# Patient Record
Sex: Female | Born: 1955
Health system: Southern US, Community
[De-identification: ages and names within clinical notes are randomized; demographics above are authoritative.]

## PROBLEM LIST (undated history)

## (undated) DIAGNOSIS — F329 Major depressive disorder, single episode, unspecified: Secondary | ICD-10-CM

## (undated) DIAGNOSIS — I10 Essential (primary) hypertension: Secondary | ICD-10-CM

## (undated) DIAGNOSIS — E079 Disorder of thyroid, unspecified: Secondary | ICD-10-CM

## (undated) DIAGNOSIS — F419 Anxiety disorder, unspecified: Secondary | ICD-10-CM

## (undated) DIAGNOSIS — F32A Depression, unspecified: Secondary | ICD-10-CM

## (undated) HISTORY — DX: Essential (primary) hypertension: I10

## (undated) HISTORY — DX: Depression, unspecified: F32.A

## (undated) HISTORY — DX: Major depressive disorder, single episode, unspecified: F32.9

## (undated) HISTORY — DX: Anxiety disorder, unspecified: F41.9

## (undated) HISTORY — DX: Disorder of thyroid, unspecified: E07.9

---

## 2016-05-31 DIAGNOSIS — M797 Fibromyalgia: Secondary | ICD-10-CM | POA: Diagnosis not present

## 2016-05-31 DIAGNOSIS — M25562 Pain in left knee: Secondary | ICD-10-CM | POA: Diagnosis not present

## 2016-05-31 DIAGNOSIS — M159 Polyosteoarthritis, unspecified: Secondary | ICD-10-CM | POA: Diagnosis not present

## 2016-05-31 DIAGNOSIS — M6080 Other myositis, unspecified site: Secondary | ICD-10-CM | POA: Diagnosis not present

## 2016-06-10 DIAGNOSIS — E039 Hypothyroidism, unspecified: Secondary | ICD-10-CM | POA: Diagnosis not present

## 2016-06-10 DIAGNOSIS — I10 Essential (primary) hypertension: Secondary | ICD-10-CM | POA: Diagnosis not present

## 2016-06-10 DIAGNOSIS — M25562 Pain in left knee: Secondary | ICD-10-CM | POA: Diagnosis not present

## 2016-06-10 DIAGNOSIS — Z0000 Encounter for general adult medical examination without abnormal findings: Secondary | ICD-10-CM | POA: Diagnosis not present

## 2016-08-30 ENCOUNTER — Encounter: Payer: Self-pay | Admitting: Physician Assistant

## 2016-08-30 ENCOUNTER — Ambulatory Visit (INDEPENDENT_AMBULATORY_CARE_PROVIDER_SITE_OTHER): Payer: BLUE CROSS/BLUE SHIELD | Admitting: Physician Assistant

## 2016-08-30 VITALS — BP 119/72 | HR 76 | Temp 97.8°F | Ht 67.0 in | Wt 318.0 lb

## 2016-08-30 DIAGNOSIS — M25511 Pain in right shoulder: Secondary | ICD-10-CM | POA: Diagnosis not present

## 2016-08-30 DIAGNOSIS — M25531 Pain in right wrist: Secondary | ICD-10-CM | POA: Diagnosis not present

## 2016-08-30 DIAGNOSIS — M5442 Lumbago with sciatica, left side: Secondary | ICD-10-CM

## 2016-08-30 DIAGNOSIS — E039 Hypothyroidism, unspecified: Secondary | ICD-10-CM | POA: Diagnosis not present

## 2016-08-30 DIAGNOSIS — M25561 Pain in right knee: Secondary | ICD-10-CM

## 2016-08-30 DIAGNOSIS — M5441 Lumbago with sciatica, right side: Secondary | ICD-10-CM

## 2016-08-30 DIAGNOSIS — M25562 Pain in left knee: Secondary | ICD-10-CM

## 2016-08-30 DIAGNOSIS — M159 Polyosteoarthritis, unspecified: Secondary | ICD-10-CM

## 2016-08-30 DIAGNOSIS — M25579 Pain in unspecified ankle and joints of unspecified foot: Secondary | ICD-10-CM | POA: Insufficient documentation

## 2016-08-30 DIAGNOSIS — F3341 Major depressive disorder, recurrent, in partial remission: Secondary | ICD-10-CM

## 2016-08-30 DIAGNOSIS — M797 Fibromyalgia: Secondary | ICD-10-CM | POA: Diagnosis not present

## 2016-08-30 DIAGNOSIS — R739 Hyperglycemia, unspecified: Secondary | ICD-10-CM | POA: Diagnosis not present

## 2016-08-30 DIAGNOSIS — G8929 Other chronic pain: Secondary | ICD-10-CM

## 2016-08-30 DIAGNOSIS — M25532 Pain in left wrist: Secondary | ICD-10-CM

## 2016-08-30 DIAGNOSIS — I10 Essential (primary) hypertension: Secondary | ICD-10-CM | POA: Diagnosis not present

## 2016-08-30 DIAGNOSIS — M25512 Pain in left shoulder: Secondary | ICD-10-CM

## 2016-08-30 LAB — BAYER DCA HB A1C WAIVED: HB A1C (BAYER DCA - WAIVED): 7.8 % — ABNORMAL HIGH (ref <7.0)

## 2016-08-30 MED ORDER — MELOXICAM 7.5 MG PO TABS: 7.5000 mg | ORAL_TABLET | Freq: Every day | ORAL | 11 refills | Status: DC

## 2016-08-30 MED ORDER — AZITHROMYCIN 250 MG PO TABS: ORAL_TABLET | ORAL | 0 refills | Status: DC

## 2016-08-30 MED ORDER — AMOXICILLIN 500 MG PO CAPS: 500.0000 mg | ORAL_CAPSULE | Freq: Three times a day (TID) | ORAL | 0 refills | Status: DC

## 2016-08-30 NOTE — Patient Instructions (Signed)

## 2016-08-30 NOTE — Progress Notes (Signed)
BP 119/72   Pulse 76   Temp 97.8 F (36.6 C) (Oral)   Ht 5\' 7"  (1.702 m)   Wt (!) 318 lb (144.2 kg)   BMI 49.81 kg/m    Subjective:    Patient ID: Cynthia Espinoza, female    DOB: 11/08/1956, 60 y.o.   MRN: [id number]  Cynthia Espinoza is a 60 y.o. female presenting on 08/30/2016 for Follow-up  HPI Patient here to be established as new patient at [facility].  This patient is known to me from [facility].  In July 2017 she had a fasting blood sugar of 169.  She was instructed to reduce carbs and sugar in her diet.  We will perform an A1c today to check on the progress. Her other labs are reviewed and will be brought in to the record.    Her other past medical history is positive for central hypertension, hypothyroidism, generalized osteoarthritis of multiple joints, fibromyalgia, chronic low back pain with bilateral sciatica, chronic pain of the knees and wrists and feet, hyperglycemia, morbid obesity, major depression. All of her medications are reviewed and brought up-to-date we'll send refills if needed.  The patient does have a disability hearing in November. It is noted that she has had multiple long-term conditions that qualify her for disability. She also has been working very limited schedule for several years now. She is very hopeful of a positive outcome.  Relevant past medical, surgical, family and social history reviewed and updated as indicated. Interim medical history since our last visit reviewed. Allergies and medications reviewed and updated.   Data reviewed from any sources in EPIC.  Review of Systems  Constitutional: Negative.  Negative for activity change, fatigue and fever.  HENT: Negative.   Eyes: Negative.   Respiratory: Negative.  Negative for cough, chest tightness and wheezing.   Cardiovascular: Positive for leg swelling. Negative for chest pain and palpitations.  Gastrointestinal: Negative.  Negative for abdominal pain.    Endocrine: Negative.   Genitourinary: Negative.  Negative for dysuria.  Musculoskeletal: Positive for arthralgias, back pain, gait problem, joint swelling and myalgias.  Skin: Negative.   Psychiatric/Behavioral: Positive for dysphoric mood and sleep disturbance.     Social History   Social History  . Marital status: Unknown    Spouse name: N/A  . Number of children: N/A  . Years of education: N/A   Occupational History  . Not on file.   Social History Main Topics  . Smoking status: Never Smoker  . Smokeless tobacco: Never Used  . Alcohol use No  . Drug use: No  . Sexual activity: Not on file   Other Topics Concern  . Not on file   Social History Narrative  . No narrative on file    Past Surgical History:  Procedure Laterality Date  . CESAREAN SECTION      Family History  Problem Relation Age of Onset  . Hypertension Mother   . Thyroid disease Mother   . Osteoporosis Mother       Medication List       Accurate as of 08/30/16  9:40 PM. Always use your most recent med list.          ALPRAZolam 0.5 MG tablet Commonly known as:  XANAX Take 0.5 mg by mouth 2 (two) times daily as needed for anxiety.   amLODipine 10 MG tablet Commonly known as:  NORVASC Take 10 mg by mouth daily.   amoxicillin 500 MG  capsule Commonly known as:  AMOXIL Take 1 capsule (500 mg total) by mouth 3 (three) times daily.   azithromycin 250 MG tablet Commonly known as:  ZITHROMAX Z-PAK As directed   benazepril 40 MG tablet Commonly known as:  LOTENSIN Take 40 mg by mouth daily.   DULoxetine 60 MG capsule Commonly known as:  CYMBALTA Take 120 mg by mouth daily.   hydrochlorothiazide 25 MG tablet Commonly known as:  HYDRODIURIL Take 25 mg by mouth daily.   levothyroxine 200 MCG tablet Commonly known as:  SYNTHROID, LEVOTHROID Take 200 mcg by mouth daily before breakfast.   meloxicam 7.5 MG tablet Commonly known as:  MOBIC Take 1 tablet (7.5 mg total) by mouth  daily.          Objective:    BP 119/72   Pulse 76   Temp 97.8 F (36.6 C) (Oral)   Ht 5\' 7"  (1.702 m)   Wt (!) 318 lb (144.2 kg)   BMI 49.81 kg/m   Allergies  Allergen Reactions  . Asa [Aspirin] Rash  . Penicillins Rash   Wt Readings from Last 3 Encounters:  08/30/16 (!) 318 lb (144.2 kg)    Physical Exam  Constitutional: She is oriented to person, place, and time. She appears well-developed and well-nourished.  HENT:  Head: Normocephalic and atraumatic.  Eyes: Conjunctivae and EOM are normal. Pupils are equal, round, and reactive to light.  Cardiovascular: Normal rate, regular rhythm, normal heart sounds and intact distal pulses.   Pulmonary/Chest: Effort normal and breath sounds normal.  Abdominal: Soft. Bowel sounds are normal.  Musculoskeletal:       Right shoulder: She exhibits decreased range of motion and tenderness.       Left shoulder: She exhibits decreased range of motion and tenderness.       Right wrist: She exhibits decreased range of motion and swelling.       Left wrist: She exhibits decreased range of motion and swelling.       Right knee: She exhibits decreased range of motion and swelling. Tenderness found.       Left knee: She exhibits decreased range of motion and swelling. Tenderness found.       Cervical back: She exhibits decreased range of motion and tenderness.       Lumbar back: She exhibits decreased range of motion, tenderness, pain and spasm.  Neurological: She is alert and oriented to person, place, and time. She has normal reflexes.  Skin: Skin is warm and dry. No rash noted.  Psychiatric: She has a normal mood and affect. Her behavior is normal. Judgment and thought content normal.    Results for orders placed or performed in visit on 08/30/16  Bayer DCA Hb A1c Waived  Result Value Ref Range   Bayer DCA Hb A1c Waived 7.8 (H) <7.0 %      Assessment & Plan:   1. Essential hypertension - hydrochlorothiazide (HYDRODIURIL) 25 MG  tablet; Take 25 mg by mouth daily. - amLODipine (NORVASC) 10 MG tablet; Take 10 mg by mouth daily. - benazepril (LOTENSIN) 40 MG tablet; Take 40 mg by mouth daily.  2. Hypothyroidism, unspecified type - levothyroxine (SYNTHROID, LEVOTHROID) 200 MCG tablet; Take 200 mcg by mouth daily before breakfast.  3. Generalized OA - DULoxetine (CYMBALTA) 60 MG capsule; Take 120 mg by mouth daily. - meloxicam (MOBIC) 7.5 MG tablet; Take 1 tablet (7.5 mg total) by mouth daily.  Dispense: 30 tablet; Refill: 11  4. Fibromyalgia - DULoxetine (CYMBALTA)  60 MG capsule; Take 120 mg by mouth daily.  5. Chronic bilateral low back pain with bilateral sciatica  6. Chronic pain of both knees  7. Pain in both wrists  8. Pain in joint involving ankle and foot, unspecified laterality  9. Pain of both shoulder joints  10. Hyperglycemia - Bayer DCA Hb A1c Waived  11. Morbid obesity (HCC)  12. Recurrent major depressive disorder, in partial remission (HCC) - ALPRAZolam (XANAX) 0.5 MG tablet; Take 0.5 mg by mouth 2 (two) times daily as needed for anxiety. - DULoxetine (CYMBALTA) 60 MG capsule; Take 120 mg by mouth daily.   Continue all other maintenance medications as listed above. Educational handout given for carb counting  Follow up plan: Return in about 3 months (around 11/30/2016).  Remus Loffler PA-C [facility] [address]  [place], [place] [address] [phone]   08/30/2016, 9:40 PM

## 2016-09-01 ENCOUNTER — Telehealth: Payer: Self-pay | Admitting: Physician Assistant

## 2016-09-01 MED ORDER — METFORMIN HCL ER 500 MG PO TB24: 500.0000 mg | ORAL_TABLET | Freq: Every day | ORAL | 2 refills | Status: DC

## 2016-09-01 NOTE — Addendum Note (Signed)
Addended by: Lorelee CoverOSTOSKY, JESSICA C on: 09/01/2016 09:44 AM   Modules accepted: Orders

## 2016-09-01 NOTE — Telephone Encounter (Signed)
Spoke with pt and advised I didn't see anything in [facility] notes about checking BS but only to be careful with her Carbohydrate intake. Pt voiced understanding.

## 2016-09-13 ENCOUNTER — Encounter: Payer: Self-pay | Admitting: Physician Assistant

## 2016-09-16 ENCOUNTER — Other Ambulatory Visit: Payer: Self-pay | Admitting: *Deleted

## 2016-09-16 DIAGNOSIS — F3341 Major depressive disorder, recurrent, in partial remission: Secondary | ICD-10-CM

## 2016-09-16 MED ORDER — ALPRAZOLAM 0.5 MG PO TABS: 0.5000 mg | ORAL_TABLET | Freq: Two times a day (BID) | ORAL | 1 refills | Status: DC | PRN

## 2016-09-19 NOTE — Telephone Encounter (Signed)
Alprazolam called in to pharmacy

## 2016-09-20 ENCOUNTER — Other Ambulatory Visit: Payer: Self-pay | Admitting: Physician Assistant

## 2016-09-23 ENCOUNTER — Other Ambulatory Visit: Payer: Self-pay | Admitting: Physician Assistant

## 2016-09-23 NOTE — Telephone Encounter (Signed)
Spoke with patient and advised we called in on 11/6 and we recalled it to the pharmacy today.

## 2016-10-26 ENCOUNTER — Ambulatory Visit (INDEPENDENT_AMBULATORY_CARE_PROVIDER_SITE_OTHER): Payer: BLUE CROSS/BLUE SHIELD | Admitting: Family

## 2016-10-26 ENCOUNTER — Encounter: Payer: Self-pay | Admitting: Family

## 2016-10-26 ENCOUNTER — Telehealth: Payer: Self-pay | Admitting: Physician Assistant

## 2016-10-26 VITALS — BP 136/78 | HR 91 | Temp 97.8°F | Ht 67.0 in | Wt 310.0 lb

## 2016-10-26 DIAGNOSIS — J0110 Acute frontal sinusitis, unspecified: Secondary | ICD-10-CM | POA: Diagnosis not present

## 2016-10-26 MED ORDER — BENZONATATE 200 MG PO CAPS: 200.0000 mg | ORAL_CAPSULE | Freq: Three times a day (TID) | ORAL | 1 refills | Status: DC | PRN

## 2016-10-26 MED ORDER — FLUTICASONE PROPIONATE 50 MCG/ACT NA SUSP: 2.0000 | Freq: Every day | NASAL | 6 refills | Status: DC

## 2016-10-26 MED ORDER — DOXYCYCLINE HYCLATE 100 MG PO TABS: 100.0000 mg | ORAL_TABLET | Freq: Two times a day (BID) | ORAL | 0 refills | Status: DC

## 2016-10-26 NOTE — Patient Instructions (Signed)

## 2016-10-26 NOTE — Progress Notes (Signed)
Subjective:    Patient ID: Cynthia Espinoza, female    DOB: [birth year], 58 y.o.   MRN: [id number]  Cough  This is a new problem. The current episode started 1 to 4 weeks ago. The problem has been gradually worsening. The problem occurs every few minutes. The cough is productive of sputum. Associated symptoms include chills, ear congestion, a fever, myalgias, nasal congestion, rhinorrhea, a sore throat, shortness of breath and wheezing. Pertinent negatives include no ear pain or headaches. The symptoms are aggravated by lying down. She has tried rest for the symptoms. The treatment provided mild relief. There is no history of asthma or COPD.      Review of Systems  Constitutional: Positive for chills and fever.  HENT: Positive for rhinorrhea and sore throat. Negative for ear pain.   Respiratory: Positive for cough, shortness of breath and wheezing.   Musculoskeletal: Positive for myalgias.  Neurological: Negative for headaches.  All other systems reviewed and are negative.      Objective:   Physical Exam  Constitutional: She is oriented to person, place, and time. She appears well-developed and well-nourished. No distress.  HENT:  Head: Normocephalic and atraumatic.  Right Ear: External ear normal.  Left Ear: External ear normal.  Nose: Mucosal edema and rhinorrhea present. Right sinus exhibits frontal sinus tenderness. Left sinus exhibits frontal sinus tenderness.  Mouth/Throat: Posterior oropharyngeal erythema present.  Eyes: Pupils are equal, round, and reactive to light.  Neck: Normal range of motion. Neck supple. No thyromegaly present.  Cardiovascular: Normal rate, regular rhythm, normal heart sounds and intact distal pulses.   No murmur heard. Pulmonary/Chest: Effort normal and breath sounds normal. No respiratory distress. She has no wheezes.  Abdominal: Soft. Bowel sounds are normal. She exhibits no distension. There is no tenderness.  Musculoskeletal: Normal range of  motion. She exhibits no edema or tenderness.  Neurological: She is alert and oriented to person, place, and time. She has normal reflexes. No cranial nerve deficit.  Skin: Skin is warm and dry.  Psychiatric: She has a normal mood and affect. Her behavior is normal. Judgment and thought content normal.  Vitals reviewed.     BP 136/78   Pulse 91   Temp 97.8 F (36.6 C) (Oral)   Ht 5\' 7"  (1.702 m)   Wt (!) 310 lb (140.6 kg)   BMI 48.55 kg/m      Assessment & Plan:  1. Acute frontal sinusitis, recurrence not specified - Take meds as prescribed - Use a cool mist humidifier  -Use saline nose sprays frequently -Saline irrigations of the nose can be very helpful if done frequently.  * 4X daily for 1 week*  * Use of a nettie pot can be helpful with this. Follow directions with this* -Force fluids -For any cough or congestion  Use plain Mucinex- regular strength or max strength is fine   * Children- consult with Pharmacist for dosing -For fever or aces or pains- take tylenol or ibuprofen appropriate for age and weight.  * for fevers greater than 101 orally you may alternate ibuprofen and tylenol every  3 hours. -Throat lozenges if help -New toothbrush in 3 days - benzonatate (TESSALON) 200 MG capsule; Take 1 capsule (200 mg total) by mouth 3 (three) times daily as needed.  Dispense: 30 capsule; Refill: 1 - fluticasone (FLONASE) 50 MCG/ACT nasal spray; Place 2 sprays into both nostrils daily.  Dispense: 16 g; Refill: 6 - doxycycline (VIBRA-TABS) 100 MG tablet; Take 1 tablet (100  mg total) by mouth 2 (two) times daily.  Dispense: 20 tablet; Refill: 0  Jannifer Rodneyhristy Alaric Gladwin, FNP

## 2016-10-26 NOTE — Telephone Encounter (Signed)
[facility] 3:[facility]

## 2016-11-14 ENCOUNTER — Other Ambulatory Visit: Payer: Self-pay | Admitting: Physician Assistant

## 2016-11-23 ENCOUNTER — Encounter: Payer: Self-pay | Admitting: Family Medicine

## 2016-11-23 ENCOUNTER — Ambulatory Visit (INDEPENDENT_AMBULATORY_CARE_PROVIDER_SITE_OTHER): Payer: BLUE CROSS/BLUE SHIELD | Admitting: Family Medicine

## 2016-11-23 VITALS — BP 130/74 | HR 82 | Temp 98.8°F | Ht 67.0 in | Wt 311.1 lb

## 2016-11-23 DIAGNOSIS — J019 Acute sinusitis, unspecified: Secondary | ICD-10-CM

## 2016-11-23 LAB — VERITOR FLU A/B WAIVED
INFLUENZA B: NEGATIVE
Influenza A: NEGATIVE

## 2016-11-23 NOTE — Progress Notes (Signed)
BP 130/74   Pulse 82   Temp 98.8 F (37.1 C) (Oral)   Ht 5\' 7"  (1.702 m)   Wt (!) 311 lb 2 oz (141.1 kg)   BMI 48.73 kg/m    Subjective:    Patient ID: Cynthia Espinoza, female    DOB: May 26, 1956, 61 y.o.   MRN: [id number]  HPI: Cynthia Espinoza is a 61 y.o. female presenting on 11/23/2016 for Legs aching , chills, cough, sinus pressure (x 3 days)   HPI Achiness and chills and cough and sinus pressure Patient has been having chills and cough and sinus pressure and achiness has been going on for the past 3 days. She denies any shortness of breath or wheezing. She denies any sick contacts that she knows of.  Relevant past medical, surgical, family and social history reviewed and updated as indicated. Interim medical history since our last visit reviewed. Allergies and medications reviewed and updated.  Review of Systems  Constitutional: Negative for chills and fever.  HENT: Positive for congestion, postnasal drip, rhinorrhea, sinus pressure and sore throat. Negative for ear discharge, ear pain and sneezing.   Eyes: Negative for pain, redness and visual disturbance.  Respiratory: Positive for cough. Negative for chest tightness and shortness of breath.   Cardiovascular: Negative for chest pain and leg swelling.  Genitourinary: Negative for difficulty urinating and dysuria.  Musculoskeletal: Positive for myalgias. Negative for back pain and gait problem.  Skin: Negative for rash.  Neurological: Negative for dizziness, light-headedness and headaches.  Psychiatric/Behavioral: Negative for agitation and behavioral problems.  All other systems reviewed and are negative.  Per HPI unless specifically indicated above  Allergies as of 11/23/2016      Reactions   Asa [aspirin] Rash   Penicillins Rash      Medication List       Accurate as of 11/23/16  4:59 PM. Always use your most recent med list.          ALPRAZolam 0.5 MG tablet Commonly known as:  XANAX Take 1 tablet (0.5 mg  total) by mouth 2 (two) times daily as needed for anxiety.   amLODipine 10 MG tablet Commonly known as:  NORVASC Take 10 mg by mouth daily.   benazepril 40 MG tablet Commonly known as:  LOTENSIN Take 40 mg by mouth daily.   benzonatate 200 MG capsule Commonly known as:  TESSALON Take 1 capsule (200 mg total) by mouth 3 (three) times daily as needed.   DULoxetine 60 MG capsule Commonly known as:  CYMBALTA Take 120 mg by mouth daily.   fluticasone 50 MCG/ACT nasal spray Commonly known as:  FLONASE Place 2 sprays into both nostrils daily.   hydrochlorothiazide 25 MG tablet Commonly known as:  HYDRODIURIL Take 25 mg by mouth daily.   levothyroxine 200 MCG tablet Commonly known as:  SYNTHROID, LEVOTHROID Take 200 mcg by mouth daily before breakfast.   meloxicam 7.5 MG tablet Commonly known as:  MOBIC Take 1 tablet (7.5 mg total) by mouth daily.   metFORMIN 500 MG 24 hr tablet Commonly known as:  GLUCOPHAGE XR Take 1 tablet (500 mg total) by mouth daily with breakfast.          Objective:    BP 130/74   Pulse 82   Temp 98.8 F (37.1 C) (Oral)   Ht 5\' 7"  (1.702 m)   Wt (!) 311 lb 2 oz (141.1 kg)   BMI 48.73 kg/m   Wt Readings from Last 3 Encounters:  11/23/16 (!) 311 lb 2 oz (141.1 kg)  10/26/16 (!) 310 lb (140.6 kg)  08/30/16 (!) 318 lb (144.2 kg)    Physical Exam  Constitutional: She is oriented to person, place, and time. She appears well-developed and well-nourished. No distress.  HENT:  Right Ear: Tympanic membrane, external ear and ear canal normal.  Left Ear: Tympanic membrane, external ear and ear canal normal.  Nose: Mucosal edema and rhinorrhea present. No epistaxis. Right sinus exhibits no maxillary sinus tenderness and no frontal sinus tenderness. Left sinus exhibits no maxillary sinus tenderness and no frontal sinus tenderness.  Mouth/Throat: Uvula is midline and mucous membranes are normal. Posterior oropharyngeal edema and posterior oropharyngeal  erythema present. No oropharyngeal exudate or tonsillar abscesses.  Eyes: Conjunctivae are normal.  Cardiovascular: Normal rate, regular rhythm, normal heart sounds and intact distal pulses.   No murmur heard. Pulmonary/Chest: Effort normal and breath sounds normal. No respiratory distress. She has no wheezes. She has no rales.  Musculoskeletal: Normal range of motion. She exhibits no edema or tenderness.  Neurological: She is alert and oriented to person, place, and time. Coordination normal.  Skin: Skin is warm and dry. No rash noted. She is not diaphoretic.  Psychiatric: She has a normal mood and affect. Her behavior is normal.  Vitals reviewed.     Assessment & Plan:   Problem List Items Addressed This Visit    None    Visit Diagnoses    Acute rhinosinusitis    -  Primary   Recommended using Flonase, Mucinex, nasal saline sprays, return or call back if not better in 4 days   Relevant Orders   Veritor Flu A/B Waived (Completed)       Follow up plan: Return if symptoms worsen or fail to improve.  Counseling provided for all of the vaccine components Orders Placed This Encounter  Procedures  . Veritor Flu A/B Waived    Arville Care, MD [facility] 11/23/2016, 4:59 PM

## 2016-11-28 ENCOUNTER — Other Ambulatory Visit: Payer: Self-pay | Admitting: Physician Assistant

## 2016-11-30 ENCOUNTER — Ambulatory Visit: Payer: BLUE CROSS/BLUE SHIELD | Admitting: Physician Assistant

## 2016-12-08 ENCOUNTER — Ambulatory Visit (INDEPENDENT_AMBULATORY_CARE_PROVIDER_SITE_OTHER): Payer: BLUE CROSS/BLUE SHIELD | Admitting: Pediatrics

## 2016-12-08 ENCOUNTER — Ambulatory Visit: Payer: BLUE CROSS/BLUE SHIELD | Admitting: Nurse Practitioner

## 2016-12-08 VITALS — BP 134/81 | HR 73 | Temp 97.9°F | Ht 67.0 in | Wt 312.0 lb

## 2016-12-08 DIAGNOSIS — J069 Acute upper respiratory infection, unspecified: Secondary | ICD-10-CM

## 2016-12-08 DIAGNOSIS — I10 Essential (primary) hypertension: Secondary | ICD-10-CM

## 2016-12-08 NOTE — Patient Instructions (Addendum)
Netipot with distilled water 2-3 times a day to clear out sinuses Or Normal saline nasal spray Flonase sensimist from [facility] steroid nasal spray Antihistamine daily such as cetirizine 10 mg Lots of fluids

## 2016-12-08 NOTE — Progress Notes (Signed)
  Subjective:   Patient ID: Loma BostonSylvia C Milliner, female    DOB: 10/24/1956, 61 y.o.   MRN: [id number] CC: sinus pressure and Nasal Congestion  HPI: Loma BostonSylvia C Carie is a 61 y.o. female presenting for sinus pressure and Nasal Congestion  Congestion, using flonase Works in [employer], has worked there for 6 yrs, going through donations When she gets to work starts coughing a lot Every time she is in that area she coughs Doesn't cough much when not there Past six months has noticed the coughing  Taking benadryl some No fevers Felt achey five days ago Lost appetite for a couple of days  Elevated bmi Lots of questions about what foods to eat or what to avoid  HTN: no chest pain, no SOB, taking meds regualrly  Relevant past medical, surgical, family and social history reviewed. Allergies and medications reviewed and updated. History  Smoking Status  . Never Smoker  Smokeless Tobacco  . Never Used   ROS: Per HPI   Objective:    BP 134/81   Pulse 73   Temp 97.9 F (36.6 C) (Oral)   Ht 5\' 7"  (1.702 m)   Wt (!) 312 lb (141.5 kg)   BMI 48.87 kg/m   Wt Readings from Last 3 Encounters:  12/08/16 (!) 312 lb (141.5 kg)  11/23/16 (!) 311 lb 2 oz (141.1 kg)  10/26/16 (!) 310 lb (140.6 kg)    Gen: NAD, alert, cooperative with exam, NCAT EYES: EOMI, no conjunctival injection, or no icterus ENT:  TMs  gray b/l, OP without erythema LYMPH: no cervical LAD CV: NRRR, normal S1/S2, no murmur, distal pulses 2+ b/l Resp: CTABL, no wheezes, normal WOB Abd: +BS, soft, NTND. no guarding or organomegaly Ext: No edema, warm Neuro: Alert and oriented, strength equal b/l UE and LE, coordination grossly normal MSK: normal muscle bulk  Assessment & Plan:  Nettie ElmSylvia was seen today for sinus pressure and nasal congestion.  Diagnoses and all orders for this visit:  Acute URI Discussed symptomatic care, return precautions  Morbid obesity (HCC) Pt with lots of questions re nutrition, food  choices Discussed stopping sodas, juice, increasing vegetable intake  Essential hypertension Adequate control today Cont current meds  Follow up plan: As scheduled Rex Krasarol Shanan Fitzpatrick, MD [facility]

## 2016-12-10 ENCOUNTER — Encounter: Payer: Self-pay | Admitting: Pediatrics

## 2016-12-14 ENCOUNTER — Other Ambulatory Visit: Payer: Self-pay | Admitting: Physician Assistant

## 2016-12-14 DIAGNOSIS — F3341 Major depressive disorder, recurrent, in partial remission: Secondary | ICD-10-CM

## 2016-12-29 ENCOUNTER — Telehealth: Payer: Self-pay | Admitting: Physician Assistant

## 2016-12-29 NOTE — Telephone Encounter (Signed)
Pt had GI virus on Monday and Tuesday Feeling some better Will call back if sxs persist or worsen

## 2017-01-10 ENCOUNTER — Encounter: Payer: Self-pay | Admitting: Family Medicine

## 2017-01-10 ENCOUNTER — Ambulatory Visit (INDEPENDENT_AMBULATORY_CARE_PROVIDER_SITE_OTHER): Payer: BLUE CROSS/BLUE SHIELD | Admitting: Family Medicine

## 2017-01-10 VITALS — BP 109/63 | HR 79 | Temp 97.5°F | Ht 67.0 in | Wt 304.0 lb

## 2017-01-10 DIAGNOSIS — E119 Type 2 diabetes mellitus without complications: Secondary | ICD-10-CM | POA: Diagnosis not present

## 2017-01-10 DIAGNOSIS — J0100 Acute maxillary sinusitis, unspecified: Secondary | ICD-10-CM

## 2017-01-10 DIAGNOSIS — M797 Fibromyalgia: Secondary | ICD-10-CM | POA: Diagnosis not present

## 2017-01-10 DIAGNOSIS — H8301 Labyrinthitis, right ear: Secondary | ICD-10-CM | POA: Diagnosis not present

## 2017-01-10 LAB — GLUCOSE HEMOCUE WAIVED: Glu Hemocue Waived: 162 mg/dL — ABNORMAL HIGH (ref 65–99)

## 2017-01-10 MED ORDER — LEVOFLOXACIN 500 MG PO TABS: 500.0000 mg | ORAL_TABLET | Freq: Every day | ORAL | 0 refills | Status: DC

## 2017-01-10 NOTE — Progress Notes (Signed)
Subjective:  Patient ID: Cynthia Espinoza, female    DOB: 1956-01-09  Age: 61 y.o. MRN: [id number]  CC: Sinusitis (pt here today c/o ears popping, sinus pressure, coughing and sinus headache.)   HPI Cynthia Espinoza presents for Symptoms include congestion, facial pain,non productive cough, post nasal drip and sinus pressure with no fever, chills,  sweats. Onset of symptoms was 1 week ago, Better since then. She awoke at 3 AMAnd she had a sensation of the room spinning. She could not get her balance and she had to call for assistance from her son. When she got back in bed she could lay with her left side down without difficulty but when she delayed the right side down and she would since the room spinning again. She eventually fell asleep and the next morning she just felt drained and washed out. Since then she has been congested having the frontal and maxillary pain. Nonproductive cough. She does not have any balance issues. However, she is concerned about recurrence of the event  History Cynthia Espinoza has a past medical history of Anxiety; Depression; Hypertension; and Thyroid disease.   She has a past surgical history that includes Cesarean section.   Her family history includes Hypertension in her mother; Osteoporosis in her mother; Thyroid disease in her mother.She reports that she has never smoked. She has never used smokeless tobacco. She reports that she does not drink alcohol or use drugs.  Current Outpatient Prescriptions on File Prior to Visit  Medication Sig Dispense Refill  . ALPRAZolam (XANAX) 0.5 MG tablet Take 1 tablet (0.5 mg total) by mouth 2 (two) times daily as needed for anxiety. 60 tablet 1  . amLODipine (NORVASC) 10 MG tablet Take 10 mg by mouth daily.    . benazepril (LOTENSIN) 40 MG tablet Take 40 mg by mouth daily.    . DULoxetine (CYMBALTA) 60 MG capsule Take 120 mg by mouth daily.    . hydrochlorothiazide (HYDRODIURIL) 25 MG tablet Take 25 mg by mouth daily.    [name] Kitchen.  levothyroxine (SYNTHROID, LEVOTHROID) 200 MCG tablet Take 200 mcg by mouth daily before breakfast.    . meloxicam (MOBIC) 7.5 MG tablet Take 1 tablet (7.5 mg total) by mouth daily. 30 tablet 11  . metFORMIN (GLUCOPHAGE XR) 500 MG 24 hr tablet Take 1 tablet (500 mg total) by mouth daily with breakfast. (Patient not taking: Reported on 01/10/2017) 30 tablet 2   No current facility-administered medications on file prior to visit.     ROS Review of Systems  Constitutional: Negative for fever.  HENT: Positive for congestion, postnasal drip, rhinorrhea and sinus pressure. Negative for ear pain, hearing loss and sore throat.   Respiratory: Positive for cough. Negative for shortness of breath.   Cardiovascular: Negative for chest pain and palpitations.       Has a history of murmur. It was first noted when she was going through testing for her fibromyalgia.  Gastrointestinal: Negative for abdominal pain.  Musculoskeletal: Positive for arthralgias and myalgias.  Neurological: Positive for dizziness.    Objective:  BP 109/63   Pulse 79   Temp 97.5 F (36.4 C) (Oral)   Ht 5\' 7"  (1.702 m)   Wt (!) 304 lb (137.9 kg)   BMI 47.61 kg/m   Physical Exam  Constitutional: She appears well-developed and well-nourished. No distress.  Skin: Skin is warm and dry.  Psychiatric: Her behavior is normal. Thought content normal. Her mood appears anxious. Her speech is rapid and/or pressured. Cognition  and memory are normal.    Assessment & Plan:   Tamiko was seen today for sinusitis.  Diagnoses and all orders for this visit:  Labyrinthitis of right ear  Fibromyalgia  Diabetes mellitus without complication (HCC) -     Glucose Hemocue Waived  Acute maxillary sinusitis, recurrence not specified  Other orders -     levofloxacin (LEVAQUIN) 500 MG tablet; Take 1 tablet (500 mg total) by mouth daily.   I have discontinued Ms. Trimble's benzonatate and fluticasone. I am also having her start on  levofloxacin. Additionally, I am having her maintain her hydrochlorothiazide, amLODipine, benazepril, DULoxetine, levothyroxine, meloxicam, metFORMIN, and ALPRAZolam.  Meds ordered this encounter  Medications  . levofloxacin (LEVAQUIN) 500 MG tablet    Sig: Take 1 tablet (500 mg total) by mouth daily.    Dispense:  7 tablet    Refill:  0     Follow-up: Return in about 2 weeks (around 01/24/2017) for diabetes with [facility].  Mechele Claude, M.D.

## 2017-01-11 ENCOUNTER — Telehealth: Payer: Self-pay | Admitting: Physician Assistant

## 2017-01-11 MED ORDER — BLOOD GLUCOSE METER KIT: PACK | 0 refills | Status: DC

## 2017-01-11 NOTE — Telephone Encounter (Signed)
Okay to send rx for glucometer and strips #60, lancets #60. Check glucose BID. Diagnosis: NIDDM with hyperglycemia

## 2017-01-11 NOTE — Telephone Encounter (Signed)
Pt is a new diabetic needs rx for a sugar meter [facility] send to [facility] [place] so insurance can pay for it.

## 2017-01-11 NOTE — Telephone Encounter (Signed)
Aware, script for meter and supplies will be faxed to [facility].

## 2017-01-17 ENCOUNTER — Other Ambulatory Visit: Payer: Self-pay | Admitting: Physician Assistant

## 2017-01-17 DIAGNOSIS — I10 Essential (primary) hypertension: Secondary | ICD-10-CM

## 2017-01-24 ENCOUNTER — Encounter: Payer: Self-pay | Admitting: Physician Assistant

## 2017-01-24 ENCOUNTER — Ambulatory Visit (INDEPENDENT_AMBULATORY_CARE_PROVIDER_SITE_OTHER): Payer: BLUE CROSS/BLUE SHIELD | Admitting: Physician Assistant

## 2017-01-24 VITALS — BP 123/78 | HR 72 | Temp 97.6°F | Ht 67.0 in | Wt 302.0 lb

## 2017-01-24 DIAGNOSIS — E119 Type 2 diabetes mellitus without complications: Secondary | ICD-10-CM

## 2017-01-24 DIAGNOSIS — I10 Essential (primary) hypertension: Secondary | ICD-10-CM | POA: Diagnosis not present

## 2017-01-24 DIAGNOSIS — M797 Fibromyalgia: Secondary | ICD-10-CM | POA: Diagnosis not present

## 2017-01-24 DIAGNOSIS — F3341 Major depressive disorder, recurrent, in partial remission: Secondary | ICD-10-CM

## 2017-01-24 DIAGNOSIS — E039 Hypothyroidism, unspecified: Secondary | ICD-10-CM

## 2017-01-24 DIAGNOSIS — M159 Polyosteoarthritis, unspecified: Secondary | ICD-10-CM

## 2017-01-24 LAB — BAYER DCA HB A1C WAIVED: HB A1C (BAYER DCA - WAIVED): 7 % — ABNORMAL HIGH (ref <7.0)

## 2017-01-24 MED ORDER — AMLODIPINE BESYLATE 10 MG PO TABS: ORAL_TABLET | ORAL | 11 refills | Status: DC

## 2017-01-24 MED ORDER — ALPRAZOLAM 0.5 MG PO TABS: 0.5000 mg | ORAL_TABLET | Freq: Two times a day (BID) | ORAL | 5 refills | Status: DC | PRN

## 2017-01-24 MED ORDER — HYDROCHLOROTHIAZIDE 25 MG PO TABS: ORAL_TABLET | ORAL | 11 refills | Status: DC

## 2017-01-24 MED ORDER — MELOXICAM 7.5 MG PO TABS: 7.5000 mg | ORAL_TABLET | Freq: Every day | ORAL | 11 refills | Status: DC

## 2017-01-24 MED ORDER — BENAZEPRIL HCL 40 MG PO TABS: ORAL_TABLET | ORAL | 11 refills | Status: DC

## 2017-01-24 MED ORDER — LEVOTHYROXINE SODIUM 200 MCG PO TABS: 200.0000 ug | ORAL_TABLET | Freq: Every day | ORAL | 11 refills | Status: DC

## 2017-01-24 NOTE — Patient Instructions (Signed)
Breakfast: eggs 2-3 Or greek yogurt low fat DANNON 1 slice delightful [employer] bread  Lunch: 2 slice [employer] delightfully bread       Or Nature's Own Light 4 ounces chicken, turkey, roast beef 1 slice Thin sliced cheese Sargento Mustard ok 1 piece of fruit  Supper: 6 ounces lean meat 2 cups raw/cooked veg or 1 cup pintos, corn lima  3 snacks 100 calories or less  

## 2017-01-25 NOTE — Progress Notes (Signed)
BP 123/78   Pulse 72   Temp 97.6 F (36.4 C) (Oral)   Ht '5\' 7"'  (1.702 m)   Wt (!) 302 lb (137 kg)   BMI 47.30 kg/m    Subjective:    Patient ID: Cynthia Espinoza, female    DOB: 02-28-1956, 61 y.o.   MRN: [id number]  HPI: Cynthia Espinoza is a 61 y.o. female presenting on 01/24/2017 for Follow-up and Diabetes  This patient comes in for periodic recheck on medications and conditions including Diabetes, hypertension, hypothyroidism, arthritis, chronic back pain. Patient reports that she was turned down on her disability most recently, was very upset about that. She has resigned her job completely. She was only working 18-20 hours at most. At the end of the day she was extremely exhausted and hurting, throughout the day she would have very severe pain in her knees back and hands. She has obtain the services of a lawyer in order to work on her disability case. I do not think this patient can ever go back to physical labor and even minor physical activity and be able to still sustain full work capabilities as expected.   She is also been working very hard on reducing the carbs in her diet. She states she is trying to do 40 g of carbs at meals and 20 g or less at snacks. We will recheck an A1c today to check on her improvement. We will add metformin if A1c is not to goal.   All medications are reviewed today. There are no reports of any problems with the medications. All of the medical conditions are reviewed and updated.  Lab work is reviewed and will be ordered as medically necessary. There are no new problems reported with today's visit.   Relevant past medical, surgical, family and social history reviewed and updated as indicated. Allergies and medications reviewed and updated.  Past Medical History:  Diagnosis Date  . Anxiety   . Depression   . Hypertension   . Thyroid disease     Past Surgical History:  Procedure Laterality Date  . CESAREAN SECTION      Review of Systems    Constitutional: Negative.  Negative for activity change, fatigue and fever.  HENT: Negative.   Eyes: Negative.   Respiratory: Negative.  Negative for cough.   Cardiovascular: Negative.  Negative for chest pain.  Gastrointestinal: Negative.  Negative for abdominal pain.  Endocrine: Negative.   Genitourinary: Negative.  Negative for dysuria.  Musculoskeletal: Positive for arthralgias, back pain, gait problem, joint swelling and myalgias.  Skin: Negative.     Allergies as of 01/24/2017      Reactions   Asa [aspirin] Rash   Penicillins Rash      Medication List       Accurate as of 01/24/17 11:59 PM. Always use your most recent med list.          ALPRAZolam 0.5 MG tablet Commonly known as:  XANAX Take 1 tablet (0.5 mg total) by mouth 2 (two) times daily as needed for anxiety.   amLODipine 10 MG tablet Commonly known as:  NORVASC TAKE ONE (1) TABLET EACH DAY   benazepril 40 MG tablet Commonly known as:  LOTENSIN TAKE ONE (1) TABLET EACH DAY   blood glucose meter kit and supplies Dispense based on patient and insurance preference. Use two times daily as directed. (FOR ICD-9 250.00, 250.01). NIDDM   DULoxetine 60 MG capsule Commonly known as:  CYMBALTA Take 120 mg by  mouth daily.   hydrochlorothiazide 25 MG tablet Commonly known as:  HYDRODIURIL TAKE ONE (1) TABLET EACH DAY   levothyroxine 200 MCG tablet Commonly known as:  SYNTHROID, LEVOTHROID Take 1 tablet (200 mcg total) by mouth daily before breakfast.   meloxicam 7.5 MG tablet Commonly known as:  MOBIC Take 1 tablet (7.5 mg total) by mouth daily.   metFORMIN 500 MG 24 hr tablet Commonly known as:  GLUCOPHAGE XR Take 1 tablet (500 mg total) by mouth daily with breakfast.          Objective:    BP 123/78   Pulse 72   Temp 97.6 F (36.4 C) (Oral)   Ht '5\' 7"'  (1.702 m)   Wt (!) 302 lb (137 kg)   BMI 47.30 kg/m   Allergies  Allergen Reactions  . Asa [Aspirin] Rash  . Penicillins Rash     Physical Exam  Constitutional: She is oriented to person, place, and time. She appears well-developed and well-nourished.  HENT:  Head: Normocephalic and atraumatic.  Eyes: Conjunctivae and EOM are normal. Pupils are equal, round, and reactive to light.  Cardiovascular: Normal rate, regular rhythm, normal heart sounds and intact distal pulses.   Pulmonary/Chest: Effort normal and breath sounds normal.  Abdominal: Soft. Bowel sounds are normal.  Musculoskeletal: She exhibits edema, tenderness and deformity.       Lumbar back: She exhibits decreased range of motion, tenderness, pain and spasm.  Neurological: She is alert and oriented to person, place, and time. She has normal reflexes.  Skin: Skin is warm and dry. No rash noted.  Psychiatric: She has a normal mood and affect. Her behavior is normal. Judgment and thought content normal.  Nursing note and vitals reviewed.   Results for orders placed or performed in visit on 01/24/17  Bayer DCA Hb A1c Waived  Result Value Ref Range   Bayer DCA Hb A1c Waived 7.0 (H) <7.0 %      Assessment & Plan:   1. Diabetes mellitus without complication ([facility]) - Bayer DCA Hb A1c Waived  2. Essential hypertension - amLODipine (NORVASC) 10 MG tablet; TAKE ONE (1) TABLET EACH DAY  Dispense: 30 tablet; Refill: 11 - benazepril (LOTENSIN) 40 MG tablet; TAKE ONE (1) TABLET EACH DAY  Dispense: 30 tablet; Refill: 11 - hydrochlorothiazide (HYDRODIURIL) 25 MG tablet; TAKE ONE (1) TABLET EACH DAY  Dispense: 30 tablet; Refill: 11  3. Hypothyroidism, unspecified type - levothyroxine (SYNTHROID, LEVOTHROID) 200 MCG tablet; Take 1 tablet (200 mcg total) by mouth daily before breakfast.  Dispense: 30 tablet; Refill: 11  4. Generalized OA - meloxicam (MOBIC) 7.5 MG tablet; Take 1 tablet (7.5 mg total) by mouth daily.  Dispense: 30 tablet; Refill: 11  5. Fibromyalgia  6. Recurrent major depressive disorder, in partial remission (HCC) - ALPRAZolam (XANAX) 0.5  MG tablet; Take 1 tablet (0.5 mg total) by mouth 2 (two) times daily as needed for anxiety.  Dispense: 60 tablet; Refill: 5   Continue all other maintenance medications as listed above.  Follow up plan: Return in about 3 months (around 04/26/2017) for recheck.  Educational handout given for carb counting  Terald Sleeper PA-C [place] [address]  [place], [place] [address] [phone]   01/25/2017, 9:38 AM

## 2017-02-03 ENCOUNTER — Ambulatory Visit: Payer: BLUE CROSS/BLUE SHIELD | Admitting: Physician Assistant
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
## 2017-02-16 ENCOUNTER — Ambulatory Visit (INDEPENDENT_AMBULATORY_CARE_PROVIDER_SITE_OTHER): Payer: BLUE CROSS/BLUE SHIELD | Admitting: Psychiatry

## 2017-02-16 ENCOUNTER — Encounter (HOSPITAL_COMMUNITY): Payer: Self-pay | Admitting: Psychiatry

## 2017-02-16 ENCOUNTER — Encounter (INDEPENDENT_AMBULATORY_CARE_PROVIDER_SITE_OTHER): Payer: Self-pay

## 2017-02-16 VITALS — BP 135/81 | HR 74 | Ht 67.0 in | Wt 302.0 lb

## 2017-02-16 DIAGNOSIS — Z818 Family history of other mental and behavioral disorders: Secondary | ICD-10-CM | POA: Diagnosis not present

## 2017-02-16 DIAGNOSIS — Z813 Family history of other psychoactive substance abuse and dependence: Secondary | ICD-10-CM | POA: Diagnosis not present

## 2017-02-16 DIAGNOSIS — F331 Major depressive disorder, recurrent, moderate: Secondary | ICD-10-CM | POA: Diagnosis not present

## 2017-02-16 DIAGNOSIS — Z79899 Other long term (current) drug therapy: Secondary | ICD-10-CM

## 2017-02-16 NOTE — Patient Instructions (Addendum)
1. Continue duloxetine 120 mg daily 2. Return to clinic in one month 3. Contact for therapy: Dr. Daisy Blossom Schneidmiller  [phone] [address] [place], [place] [address]

## 2017-03-14 ENCOUNTER — Other Ambulatory Visit: Payer: Self-pay | Admitting: Physician Assistant

## 2017-03-14 ENCOUNTER — Ambulatory Visit (HOSPITAL_COMMUNITY): Payer: Self-pay | Admitting: Psychiatry

## 2017-03-14 DIAGNOSIS — I10 Essential (primary) hypertension: Secondary | ICD-10-CM

## 2017-05-12 ENCOUNTER — Ambulatory Visit (INDEPENDENT_AMBULATORY_CARE_PROVIDER_SITE_OTHER): Payer: BLUE CROSS/BLUE SHIELD | Admitting: Physician Assistant

## 2017-05-12 ENCOUNTER — Encounter: Payer: Self-pay | Admitting: Physician Assistant

## 2017-05-12 VITALS — BP 130/78 | HR 74 | Temp 97.5°F | Ht 67.0 in | Wt 307.8 lb

## 2017-05-12 DIAGNOSIS — R002 Palpitations: Secondary | ICD-10-CM | POA: Diagnosis not present

## 2017-05-12 DIAGNOSIS — G8929 Other chronic pain: Secondary | ICD-10-CM

## 2017-05-12 DIAGNOSIS — M5441 Lumbago with sciatica, right side: Secondary | ICD-10-CM | POA: Diagnosis not present

## 2017-05-12 DIAGNOSIS — M159 Polyosteoarthritis, unspecified: Secondary | ICD-10-CM

## 2017-05-12 DIAGNOSIS — I10 Essential (primary) hypertension: Secondary | ICD-10-CM | POA: Diagnosis not present

## 2017-05-12 DIAGNOSIS — I4949 Other premature depolarization: Secondary | ICD-10-CM | POA: Diagnosis not present

## 2017-05-12 DIAGNOSIS — E119 Type 2 diabetes mellitus without complications: Secondary | ICD-10-CM

## 2017-05-12 DIAGNOSIS — E039 Hypothyroidism, unspecified: Secondary | ICD-10-CM

## 2017-05-12 DIAGNOSIS — M5442 Lumbago with sciatica, left side: Secondary | ICD-10-CM

## 2017-05-12 LAB — BAYER DCA HB A1C WAIVED: HB A1C (BAYER DCA - WAIVED): 6.6 % (ref <7.0)

## 2017-05-12 MED ORDER — METFORMIN HCL ER 500 MG PO TB24: 500.0000 mg | ORAL_TABLET | Freq: Every day | ORAL | 0 refills | Status: DC

## 2017-05-12 MED ORDER — HYDROCHLOROTHIAZIDE 25 MG PO TABS: ORAL_TABLET | ORAL | 3 refills | Status: DC

## 2017-05-12 MED ORDER — BENAZEPRIL HCL 40 MG PO TABS: ORAL_TABLET | ORAL | 3 refills | Status: DC

## 2017-05-12 MED ORDER — AMLODIPINE BESYLATE 10 MG PO TABS: ORAL_TABLET | ORAL | 3 refills | Status: DC

## 2017-05-12 NOTE — Patient Instructions (Signed)
Palpitations A palpitation is the feeling that your heart:  Has an uneven (irregular) heartbeat.  Is beating faster than normal.  Is fluttering.  Is skipping a beat.  This is usually not a serious problem. In some cases, you may need more medical tests. Follow these instructions at home:  Avoid: ? Caffeine in coffee, tea, soft drinks, diet pills, and energy drinks. ? Chocolate. ? Alcohol.  Do not use any tobacco products. These include cigarettes, chewing tobacco, and e-cigarettes. If you need help quitting, ask your doctor.  Try to reduce your stress. These things may help: ? Yoga. ? Meditation. ? Physical activity. Swimming, jogging, and walking are good choices. ? A method that helps you use your mind to control things in your body, like heartbeats (biofeedback).  Get plenty of rest and sleep.  Take over-the-counter and prescription medicines only as told by your doctor.  Keep all follow-up visits as told by your doctor. This is important. Contact a doctor if:  Your heartbeat is still fast or uneven after 24 hours.  Your palpitations occur more often. Get help right away if:  You have chest pain.  You feel short of breath.  You have a very bad headache.  You feel dizzy.  You pass out (faint). This information is not intended to replace advice given to you by your health care provider. Make sure you discuss any questions you have with your health care provider. Document Released: 08/09/2008 Document Revised: 04/07/2016 Document Reviewed: 07/16/2015 Elsevier Interactive Patient Education  2018 Elsevier Inc.  

## 2017-05-15 NOTE — Progress Notes (Signed)
BP 130/78   Pulse 74   Temp 97.5 F (36.4 C) (Oral)   Ht _0  (1.702 m)   Wt (!) 307 lb 12.8 oz (139.6 kg)   BMI 48.21 kg/m    Subjective:    Patient ID: Cynthia Espinoza, female    DOB: [birth year], 61 y.o.   MRN: [id number]  HPI: Cynthia Espinoza is a 61 y.o. female presenting on 05/12/2017 for Follow-up (3 month )  This patient comes in for periodic recheck on medications and conditions including Hypertension, diabetes, hypothyroidism, chronic low back pain with sciatica, generalized osteoarthritis. Overall she is doing well with these things. They are stable she has not had any major setback with her activity. She is completely out of work now. She will be going for a disability hearing. All of her medications are reviewed. The patient does have a new complaint of an occasional palpitation. She will feel it more so at night when she lays down and is quiet. She denies any severe chest pain, shortness of breath, edema or swelling in the lower extremities. There is some family history of heart disease..   All medications are reviewed today. There are no reports of any problems with the medications. All of the medical conditions are reviewed and updated.  Lab work is reviewed and will be ordered as medically necessary. There are no new problems reported with today's visit.  Relevant past medical, surgical, family and social history reviewed and updated as indicated. Allergies and medications reviewed and updated.  Past Medical History:  Diagnosis Date  . Anxiety   . Depression   . Hypertension   . Thyroid disease     Past Surgical History:  Procedure Laterality Date  . CESAREAN SECTION      Review of Systems  Constitutional: Negative.  Negative for activity change, fatigue and fever.  HENT: Negative.  Negative for congestion.   Eyes: Negative.   Respiratory: Negative.  Negative for cough, shortness of breath and wheezing.   Cardiovascular: Positive for palpitations. Negative for  chest pain and leg swelling.  Gastrointestinal: Negative.  Negative for abdominal pain.  Endocrine: Negative.   Genitourinary: Negative.  Negative for dysuria.  Musculoskeletal: Negative.   Skin: Negative.   Neurological: Negative.     Allergies as of 05/12/2017      Reactions   Asa [aspirin] Rash   Penicillins Rash      Medication List       Accurate as of 05/12/17 11:59 PM. Always use your most recent med list.          ALPRAZolam 0.5 MG tablet Commonly known as:  XANAX Take 1 tablet (0.5 mg total) by mouth 2 (two) times daily as needed for anxiety.   amLODipine 10 MG tablet Commonly known as:  NORVASC TAKE ONE (1) TABLET EACH DAY   benazepril 40 MG tablet Commonly known as:  LOTENSIN TAKE ONE (1) TABLET EACH DAY   blood glucose meter kit and supplies Dispense based on patient and insurance preference. Use two times daily as directed. (FOR ICD-9 250.00, 250.01). NIDDM   DULoxetine 60 MG capsule Commonly known as:  CYMBALTA Take 120 mg by mouth daily.   hydrochlorothiazide 25 MG tablet Commonly known as:  HYDRODIURIL TAKE ONE (1) TABLET EACH DAY   levothyroxine 200 MCG tablet Commonly known as:  SYNTHROID, LEVOTHROID Take 1 tablet (200 mcg total) by mouth daily before breakfast.   meloxicam 7.5 MG tablet Commonly known as:  MOBIC Take 1  tablet (7.5 mg total) by mouth daily.   metFORMIN 500 MG 24 hr tablet Commonly known as:  GLUCOPHAGE XR Take 1 tablet (500 mg total) by mouth daily with breakfast.          Objective:    BP 130/78   Pulse 74   Temp 97.5 F (36.4 C) (Oral)   Ht _0  (1.702 m)   Wt (!) 307 lb 12.8 oz (139.6 kg)   BMI 48.21 kg/m   Allergies  Allergen Reactions  . Asa [Aspirin] Rash  . Penicillins Rash    Physical Exam  Constitutional: She is oriented to person, place, and time. She appears well-developed and well-nourished.  HENT:  Head: Normocephalic and atraumatic.  Eyes: Conjunctivae and EOM are normal. Pupils are  equal, round, and reactive to light.  Cardiovascular: Normal rate, regular rhythm, S1 normal, S2 normal, normal heart sounds and intact distal pulses.   Occasional extrasystoles are present. PMI is not displaced.   Pulmonary/Chest: Effort normal and breath sounds normal.  Abdominal: Soft. Bowel sounds are normal.  Neurological: She is alert and oriented to person, place, and time. She has normal reflexes.  Skin: Skin is warm and dry. No rash noted.  Psychiatric: She has a normal mood and affect. Her behavior is normal. Judgment and thought content normal.  Nursing note and vitals reviewed.   Results for orders placed or performed in visit on 05/12/17  Bayer DCA Hb A1c Waived  Result Value Ref Range   Bayer DCA Hb A1c Waived 6.6 <7.0 %      Assessment & Plan:   1. Essential hypertension - amLODipine (NORVASC) 10 MG tablet; TAKE ONE (1) TABLET EACH DAY  Dispense: 90 tablet; Refill: 3 - benazepril (LOTENSIN) 40 MG tablet; TAKE ONE (1) TABLET EACH DAY  Dispense: 90 tablet; Refill: 3 - hydrochlorothiazide (HYDRODIURIL) 25 MG tablet; TAKE ONE (1) TABLET EACH DAY  Dispense: 90 tablet; Refill: 3  2. Diabetes mellitus without complication (HCC) - metFORMIN (GLUCOPHAGE XR) 500 MG 24 hr tablet; Take 1 tablet (500 mg total) by mouth daily with breakfast.  Dispense: 90 tablet; Refill: 0 - Bayer DCA Hb A1c Waived  3. Hypothyroidism, unspecified type  4. Chronic bilateral low back pain with bilateral sciatica  5. Generalized OA  6. Palpitations - Ambulatory referral to Cardiology  7. Premature beat - Ambulatory referral to Cardiology   Current Outpatient Prescriptions:  .  ALPRAZolam (XANAX) 0.5 MG tablet, Take 1 tablet (0.5 mg total) by mouth 2 (two) times daily as needed for anxiety., Disp: 60 tablet, Rfl: 5 .  amLODipine (NORVASC) 10 MG tablet, TAKE ONE (1) TABLET EACH DAY, Disp: 90 tablet, Rfl: 3 .  benazepril (LOTENSIN) 40 MG tablet, TAKE ONE (1) TABLET EACH DAY, Disp: 90 tablet,  Rfl: 3 .  DULoxetine (CYMBALTA) 60 MG capsule, Take 120 mg by mouth daily., Disp: , Rfl:  .  hydrochlorothiazide (HYDRODIURIL) 25 MG tablet, TAKE ONE (1) TABLET EACH DAY, Disp: 90 tablet, Rfl: 3 .  levothyroxine (SYNTHROID, LEVOTHROID) 200 MCG tablet, Take 1 tablet (200 mcg total) by mouth daily before breakfast., Disp: 30 tablet, Rfl: 11 .  meloxicam (MOBIC) 7.5 MG tablet, Take 1 tablet (7.5 mg total) by mouth daily., Disp: 30 tablet, Rfl: 11 .  blood glucose meter kit and supplies, Dispense based on patient and insurance preference. Use two times daily as directed. (FOR ICD-9 250.00, 250.01). NIDDM, Disp: 1 each, Rfl: 0 .  metFORMIN (GLUCOPHAGE XR) 500 MG 24 hr tablet, Take  1 tablet (500 mg total) by mouth daily with breakfast., Disp: 90 tablet, Rfl: 0  Continue all other maintenance medications as listed above.  Follow up plan: Return in about 3 months (around 08/12/2017) for recheck.  Educational handout given for [place] PA-C [place] [address]  [place], [place] [address] [phone]   05/15/2017, 3:22 PM

## 2017-06-13 ENCOUNTER — Ambulatory Visit: Payer: Self-pay | Admitting: Cardiovascular Disease

## 2017-06-26 ENCOUNTER — Ambulatory Visit: Payer: Self-pay | Admitting: Cardiovascular Disease

## 2017-07-03 DIAGNOSIS — Z029 Encounter for administrative examinations, unspecified: Secondary | ICD-10-CM

## 2017-08-29 ENCOUNTER — Other Ambulatory Visit: Payer: Self-pay | Admitting: Physician Assistant

## 2017-08-29 DIAGNOSIS — F3341 Major depressive disorder, recurrent, in partial remission: Secondary | ICD-10-CM

## 2017-08-30 NOTE — Telephone Encounter (Signed)
rx called into pharmacy

## 2017-10-26 ENCOUNTER — Other Ambulatory Visit: Payer: Self-pay | Admitting: Physician Assistant

## 2017-10-26 DIAGNOSIS — F3341 Major depressive disorder, recurrent, in partial remission: Secondary | ICD-10-CM

## 2017-10-27 NOTE — Telephone Encounter (Signed)
rx called into pharmacy

## 2017-10-27 NOTE — Telephone Encounter (Signed)
Last seen 6/18  Lawanna KobusAngel  If approved route to nurse to call into [facility]

## 2017-11-29 ENCOUNTER — Other Ambulatory Visit: Payer: Self-pay | Admitting: Physician Assistant

## 2017-11-29 DIAGNOSIS — F3341 Major depressive disorder, recurrent, in partial remission: Secondary | ICD-10-CM

## 2017-11-29 NOTE — Telephone Encounter (Signed)
Last seen 05/12/17  Cynthia Espinoza

## 2018-01-04 ENCOUNTER — Other Ambulatory Visit: Payer: Self-pay | Admitting: Physician Assistant

## 2018-01-04 DIAGNOSIS — E039 Hypothyroidism, unspecified: Secondary | ICD-10-CM

## 2018-01-04 MED ORDER — LEVOTHYROXINE SODIUM 200 MCG PO TABS: 200.0000 ug | ORAL_TABLET | Freq: Every day | ORAL | 0 refills | Status: DC

## 2018-01-04 NOTE — Telephone Encounter (Signed)
Patient aware rx filled and she is aware that she needs to be seen.

## 2018-02-19 ENCOUNTER — Other Ambulatory Visit: Payer: Self-pay | Admitting: Physician Assistant

## 2018-02-19 DIAGNOSIS — E119 Type 2 diabetes mellitus without complications: Secondary | ICD-10-CM

## 2018-03-06 ENCOUNTER — Other Ambulatory Visit: Payer: Self-pay | Admitting: Physician Assistant

## 2018-03-06 DIAGNOSIS — F3341 Major depressive disorder, recurrent, in partial remission: Secondary | ICD-10-CM

## 2018-03-29 ENCOUNTER — Other Ambulatory Visit: Payer: Self-pay | Admitting: Physician Assistant

## 2018-03-29 DIAGNOSIS — E039 Hypothyroidism, unspecified: Secondary | ICD-10-CM

## 2018-03-29 MED ORDER — LEVOTHYROXINE SODIUM 200 MCG PO TABS: 200.0000 ug | ORAL_TABLET | Freq: Every day | ORAL | 0 refills | Status: DC

## 2018-03-29 NOTE — Telephone Encounter (Signed)
OV 04/12/18 30 day refill sent to [facility]

## 2018-03-31 ENCOUNTER — Other Ambulatory Visit: Payer: Self-pay | Admitting: Physician Assistant

## 2018-03-31 DIAGNOSIS — E039 Hypothyroidism, unspecified: Secondary | ICD-10-CM

## 2018-04-02 NOTE — Telephone Encounter (Signed)
Last thyroid 06/10/16

## 2018-04-12 ENCOUNTER — Encounter: Payer: Self-pay | Admitting: Physician Assistant

## 2018-04-12 ENCOUNTER — Ambulatory Visit: Payer: BLUE CROSS/BLUE SHIELD | Admitting: Physician Assistant

## 2018-04-12 DIAGNOSIS — F3341 Major depressive disorder, recurrent, in partial remission: Secondary | ICD-10-CM | POA: Diagnosis not present

## 2018-04-12 DIAGNOSIS — I10 Essential (primary) hypertension: Secondary | ICD-10-CM | POA: Diagnosis not present

## 2018-04-12 DIAGNOSIS — E119 Type 2 diabetes mellitus without complications: Secondary | ICD-10-CM

## 2018-04-12 DIAGNOSIS — E039 Hypothyroidism, unspecified: Secondary | ICD-10-CM | POA: Diagnosis not present

## 2018-04-12 DIAGNOSIS — M159 Polyosteoarthritis, unspecified: Secondary | ICD-10-CM | POA: Diagnosis not present

## 2018-04-12 LAB — BAYER DCA HB A1C WAIVED: HB A1C (BAYER DCA - WAIVED): 7.6 % — ABNORMAL HIGH (ref <7.0)

## 2018-04-12 MED ORDER — MELOXICAM 7.5 MG PO TABS: 7.5000 mg | ORAL_TABLET | Freq: Every day | ORAL | 11 refills | Status: DC

## 2018-04-12 MED ORDER — AMLODIPINE BESYLATE 10 MG PO TABS: ORAL_TABLET | ORAL | 3 refills | Status: DC

## 2018-04-12 MED ORDER — AZITHROMYCIN 250 MG PO TABS: ORAL_TABLET | ORAL | 0 refills | Status: DC

## 2018-04-12 MED ORDER — LEVOTHYROXINE SODIUM 200 MCG PO TABS: ORAL_TABLET | ORAL | 3 refills | Status: DC

## 2018-04-12 MED ORDER — METFORMIN HCL ER 500 MG PO TB24: ORAL_TABLET | ORAL | 3 refills | Status: DC

## 2018-04-12 MED ORDER — HYDROCHLOROTHIAZIDE 25 MG PO TABS: ORAL_TABLET | ORAL | 3 refills | Status: DC

## 2018-04-12 MED ORDER — ALPRAZOLAM 0.5 MG PO TABS: ORAL_TABLET | ORAL | 0 refills | Status: DC

## 2018-04-12 MED ORDER — BENAZEPRIL HCL 40 MG PO TABS: ORAL_TABLET | ORAL | 3 refills | Status: DC

## 2018-04-13 LAB — CBC WITH DIFFERENTIAL/PLATELET
Basophils Absolute: 0 10*3/uL (ref 0.0–0.2)
Basos: 0 %
EOS (ABSOLUTE): 0.1 10*3/uL (ref 0.0–0.4)
EOS: 2 %
HEMOGLOBIN: 14 g/dL (ref 11.1–15.9)
Hematocrit: 40.2 % (ref 34.0–46.6)
IMMATURE GRANS (ABS): 0 10*3/uL (ref 0.0–0.1)
IMMATURE GRANULOCYTES: 0 %
LYMPHS: 30 %
Lymphocytes Absolute: 2.6 10*3/uL (ref 0.7–3.1)
MCH: 31.7 pg (ref 26.6–33.0)
MCHC: 34.8 g/dL (ref 31.5–35.7)
MCV: 91 fL (ref 79–97)
MONOCYTES: 10 %
Monocytes Absolute: 0.9 10*3/uL (ref 0.1–0.9)
NEUTROS PCT: 58 %
Neutrophils Absolute: 5.2 10*3/uL (ref 1.4–7.0)
Platelets: 261 10*3/uL (ref 150–450)
RBC: 4.41 x10E6/uL (ref 3.77–5.28)
RDW: 13.7 % (ref 12.3–15.4)
WBC: 8.9 10*3/uL (ref 3.4–10.8)

## 2018-04-13 LAB — CMP14+EGFR
A/G RATIO: 1.4 (ref 1.2–2.2)
ALT: 51 IU/L — ABNORMAL HIGH (ref 0–32)
AST: 46 IU/L — ABNORMAL HIGH (ref 0–40)
Albumin: 4.4 g/dL (ref 3.5–5.5)
Alkaline Phosphatase: 58 IU/L (ref 39–117)
BILIRUBIN TOTAL: 0.4 mg/dL (ref 0.0–1.2)
BUN / CREAT RATIO: 15 (ref 9–23)
BUN: 12 mg/dL (ref 6–24)
CALCIUM: 9.4 mg/dL (ref 8.7–10.2)
CO2: 23 mmol/L (ref 20–29)
Chloride: 100 mmol/L (ref 96–106)
Creatinine, Ser: 0.8 mg/dL (ref 0.57–1.00)
GFR, EST AFRICAN AMERICAN: 93 mL/min/{1.73_m2} (ref >59)
GFR, EST NON AFRICAN AMERICAN: 81 mL/min/{1.73_m2} (ref >59)
GLOBULIN, TOTAL: 3.2 g/dL (ref 1.5–4.5)
Glucose: 118 mg/dL — ABNORMAL HIGH (ref 65–99)
POTASSIUM: 3.8 mmol/L (ref 3.5–5.2)
SODIUM: 139 mmol/L (ref 134–144)
TOTAL PROTEIN: 7.6 g/dL (ref 6.0–8.5)

## 2018-04-13 LAB — TSH: TSH: 2.75 u[IU]/mL (ref 0.450–4.500)

## 2018-04-15 NOTE — Progress Notes (Signed)
BP 134/82   Pulse 78   Temp 98.1 F (36.7 C) (Oral)   Ht '5\' 7"'  (1.702 m)   Wt (!) 314 lb 6.4 oz (142.6 kg)   BMI 49.24 kg/m    Subjective:    Patient ID: Cynthia Espinoza, female    DOB: [birth year], 62 y.o.   MRN: [id number]  HPI: Cynthia Espinoza is a 62 y.o. female presenting on 04/12/2018 for Hypertension; Diabetes; Fever; and Hypothyroidism  Patient comes in for chronic checkup on her chronic medical conditions.  She is quite disabled from most of them.  She does have significant arthritis in many joints.  Most days she is unable to complete her activities of daily living without interrupting and having to rest frequently.  She is unable to work a Scientist, research (physical sciences) job and do any type of long-term standing on her feet.  Her other chronic conditions do include depression, hypertension,  hypothyroidism, diabetes controlled and of course generalized arthritis and fibromyalgia throughout her body.  All of her medications are reviewed today and refills will be sent as needed.  We will update labs soon.  Past Medical History:  Diagnosis Date  . Anxiety   . Depression   . Hypertension   . Thyroid disease    Relevant past medical, surgical, family and social history reviewed and updated as indicated. Interim medical history since our last visit reviewed. Allergies and medications reviewed and updated. DATA REVIEWED: CHART IN EPIC  Family History reviewed for pertinent findings.  Review of Systems  Constitutional: Positive for fatigue. Negative for activity change and fever.  HENT: Negative.   Eyes: Negative.   Respiratory: Negative.  Negative for cough.   Cardiovascular: Negative.  Negative for chest pain.  Gastrointestinal: Negative.  Negative for abdominal pain.  Endocrine: Negative.   Genitourinary: Negative.  Negative for dysuria.  Musculoskeletal: Positive for arthralgias, back pain, gait problem, joint swelling and myalgias.  Skin: Negative.     Allergies as of 04/12/2018    Reactions   Asa [aspirin] Rash   Penicillins Rash      Medication List        Accurate as of 04/12/18 11:59 PM. Always use your most recent med list.          ALPRAZolam 0.5 MG tablet Commonly known as:  XANAX TAKE 1 TABLET TWICE DAILY AS NEEDED FOR ANXIETY   amLODipine 10 MG tablet Commonly known as:  NORVASC TAKE ONE (1) TABLET EACH DAY   azithromycin 250 MG tablet Commonly known as:  ZITHROMAX Take as directed   benazepril 40 MG tablet Commonly known as:  LOTENSIN TAKE ONE (1) TABLET EACH DAY   blood glucose meter kit and supplies Dispense based on patient and insurance preference. Use two times daily as directed. (FOR ICD-9 250.00, 250.01). NIDDM   DULoxetine 60 MG capsule Commonly known as:  CYMBALTA Take 120 mg by mouth daily.   hydrochlorothiazide 25 MG tablet Commonly known as:  HYDRODIURIL TAKE ONE (1) TABLET EACH DAY   levothyroxine 200 MCG tablet Commonly known as:  SYNTHROID, LEVOTHROID TAKE ONE TABLET EACH MORNING BEFORE BREAKFAST   meloxicam 7.5 MG tablet Commonly known as:  MOBIC Take 1 tablet (7.5 mg total) by mouth daily.   metFORMIN 500 MG 24 hr tablet Commonly known as:  GLUCOPHAGE-XR TAKE 1 TABLET EVERY MORNING WITH FOOD          Objective:    BP 134/82   Pulse 78   Temp 98.1 F (  36.7 C) (Oral)   Ht '5\' 7"'  (1.702 m)   Wt (!) 314 lb 6.4 oz (142.6 kg)   BMI 49.24 kg/m   Allergies  Allergen Reactions  . Asa [Aspirin] Rash  . Penicillins Rash    Wt Readings from Last 3 Encounters:  04/12/18 (!) 314 lb 6.4 oz (142.6 kg)  05/12/17 (!) 307 lb 12.8 oz (139.6 kg)  01/24/17 (!) 302 lb (137 kg)    Physical Exam  Constitutional: She is oriented to person, place, and time. She appears well-developed and well-nourished.  HENT:  Head: Normocephalic and atraumatic.  Right Ear: Tympanic membrane, external ear and ear canal normal.  Left Ear: Tympanic membrane, external ear and ear canal normal.  Nose: Nose normal. No rhinorrhea.    Mouth/Throat: Oropharynx is clear and moist and mucous membranes are normal. No oropharyngeal exudate or posterior oropharyngeal erythema.  Eyes: Pupils are equal, round, and reactive to light. Conjunctivae and EOM are normal.  Neck: Normal range of motion. Neck supple.  Cardiovascular: Normal rate, regular rhythm, normal heart sounds and intact distal pulses.  Pulmonary/Chest: Effort normal and breath sounds normal.  Abdominal: Soft. Bowel sounds are normal.  Musculoskeletal:       Right knee: She exhibits decreased range of motion, swelling and deformity.       Left knee: She exhibits decreased range of motion, swelling and deformity.       Lumbar back: She exhibits decreased range of motion, pain and spasm.       Right hand: She exhibits tenderness and deformity.       Left hand: She exhibits tenderness and deformity.  Neurological: She is alert and oriented to person, place, and time. She has normal reflexes.  Skin: Skin is warm and dry. No rash noted.  Psychiatric: She has a normal mood and affect. Her behavior is normal. Judgment and thought content normal.    Results for orders placed or performed in visit on 04/12/18  CBC with Differential/Platelet  Result Value Ref Range   WBC 8.9 3.4 - 10.8 x10E3/uL   RBC 4.41 3.77 - 5.28 x10E6/uL   Hemoglobin 14.0 11.1 - 15.9 g/dL   Hematocrit 40.2 34.0 - 46.6 %   MCV 91 79 - 97 fL   MCH 31.7 26.6 - 33.0 pg   MCHC 34.8 31.5 - 35.7 g/dL   RDW 13.7 12.3 - 15.4 %   Platelets 261 150 - 450 x10E3/uL   Neutrophils 58 Not Estab. %   Lymphs 30 Not Estab. %   Monocytes 10 Not Estab. %   Eos 2 Not Estab. %   Basos 0 Not Estab. %   Neutrophils Absolute 5.2 1.4 - 7.0 x10E3/uL   Lymphocytes Absolute 2.6 0.7 - 3.1 x10E3/uL   Monocytes Absolute 0.9 0.1 - 0.9 x10E3/uL   EOS (ABSOLUTE) 0.1 0.0 - 0.4 x10E3/uL   Basophils Absolute 0.0 0.0 - 0.2 x10E3/uL   Immature Granulocytes 0 Not Estab. %   Immature Grans (Abs) 0.0 0.0 - 0.1 x10E3/uL  CMP14+EGFR   Result Value Ref Range   Glucose 118 (H) 65 - 99 mg/dL   BUN 12 6 - 24 mg/dL   Creatinine, Ser 0.80 0.57 - 1.00 mg/dL   GFR calc non Af Amer 81 >59 mL/min/1.73   GFR calc Af Amer 93 >59 mL/min/1.73   BUN/Creatinine Ratio 15 9 - 23   Sodium 139 134 - 144 mmol/L   Potassium 3.8 3.5 - 5.2 mmol/L   Chloride 100 96 - 106  mmol/L   CO2 23 20 - 29 mmol/L   Calcium 9.4 8.7 - 10.2 mg/dL   Total Protein 7.6 6.0 - 8.5 g/dL   Albumin 4.4 3.5 - 5.5 g/dL   Globulin, Total 3.2 1.5 - 4.5 g/dL   Albumin/Globulin Ratio 1.4 1.2 - 2.2   Bilirubin Total 0.4 0.0 - 1.2 mg/dL   Alkaline Phosphatase 58 39 - 117 IU/L   AST 46 (H) 0 - 40 IU/L   ALT 51 (H) 0 - 32 IU/L  Bayer DCA Hb A1c Waived  Result Value Ref Range   HB A1C (BAYER DCA - WAIVED) 7.6 (H) <7.0 %  TSH  Result Value Ref Range   TSH 2.750 0.450 - 4.500 uIU/mL      Assessment & Plan:   1. Recurrent major depressive disorder, in partial remission (HCC) - ALPRAZolam (XANAX) 0.5 MG tablet; TAKE 1 TABLET TWICE DAILY AS NEEDED FOR ANXIETY  Dispense: 180 tablet; Refill: 0  2. Essential hypertension - hydrochlorothiazide (HYDRODIURIL) 25 MG tablet; TAKE ONE (1) TABLET EACH DAY  Dispense: 90 tablet; Refill: 3 - benazepril (LOTENSIN) 40 MG tablet; TAKE ONE (1) TABLET EACH DAY  Dispense: 90 tablet; Refill: 3 - amLODipine (NORVASC) 10 MG tablet; TAKE ONE (1) TABLET EACH DAY  Dispense: 90 tablet; Refill: 3 - CBC with Differential/Platelet - CMP14+EGFR  3. Hypothyroidism, unspecified type - levothyroxine (SYNTHROID, LEVOTHROID) 200 MCG tablet; TAKE ONE TABLET EACH MORNING BEFORE BREAKFAST  Dispense: 90 tablet; Refill: 3 - TSH  4. Generalized OA - meloxicam (MOBIC) 7.5 MG tablet; Take 1 tablet (7.5 mg total) by mouth daily.  Dispense: 30 tablet; Refill: 11  5. Diabetes mellitus without complication (HCC) - metFORMIN (GLUCOPHAGE-XR) 500 MG 24 hr tablet; TAKE 1 TABLET EVERY MORNING WITH FOOD  Dispense: 90 tablet; Refill: 3 - Bayer DCA Hb A1c  Waived   Continue all other maintenance medications as listed above.  Follow up plan: No follow-ups on file.  Educational handout given for [place] PA-C [place] [address]  [place], [place] [address] [phone]   04/15/2018, 9:57 PM

## 2018-04-16 ENCOUNTER — Other Ambulatory Visit: Payer: Self-pay

## 2018-04-16 DIAGNOSIS — E119 Type 2 diabetes mellitus without complications: Secondary | ICD-10-CM

## 2018-04-16 MED ORDER — METFORMIN HCL ER 500 MG PO TB24: ORAL_TABLET | ORAL | 3 refills | Status: DC

## 2018-06-05 ENCOUNTER — Other Ambulatory Visit: Payer: Self-pay | Admitting: Physician Assistant

## 2018-06-05 DIAGNOSIS — F3341 Major depressive disorder, recurrent, in partial remission: Secondary | ICD-10-CM

## 2018-06-06 NOTE — Telephone Encounter (Signed)
Last seen 04/12/18  Angel 

## 2018-09-07 ENCOUNTER — Other Ambulatory Visit: Payer: Self-pay | Admitting: Physician Assistant

## 2018-09-07 DIAGNOSIS — F3341 Major depressive disorder, recurrent, in partial remission: Secondary | ICD-10-CM

## 2018-09-10 NOTE — Telephone Encounter (Signed)
Last seen 04/12/18

## 2018-10-03 ENCOUNTER — Ambulatory Visit: Payer: BLUE CROSS/BLUE SHIELD | Admitting: Physician Assistant

## 2018-10-05 ENCOUNTER — Encounter: Payer: Self-pay | Admitting: Physician Assistant

## 2018-10-05 ENCOUNTER — Ambulatory Visit: Payer: BLUE CROSS/BLUE SHIELD | Admitting: Physician Assistant

## 2018-10-05 VITALS — BP 142/91 | HR 75 | Temp 97.5°F | Ht 67.0 in | Wt 320.4 lb

## 2018-10-05 DIAGNOSIS — F331 Major depressive disorder, recurrent, moderate: Secondary | ICD-10-CM

## 2018-10-05 DIAGNOSIS — Z8659 Personal history of other mental and behavioral disorders: Secondary | ICD-10-CM

## 2018-10-05 DIAGNOSIS — M797 Fibromyalgia: Secondary | ICD-10-CM

## 2018-10-05 DIAGNOSIS — M25561 Pain in right knee: Secondary | ICD-10-CM

## 2018-10-05 DIAGNOSIS — M25531 Pain in right wrist: Secondary | ICD-10-CM

## 2018-10-05 DIAGNOSIS — M25562 Pain in left knee: Secondary | ICD-10-CM

## 2018-10-05 DIAGNOSIS — M25579 Pain in unspecified ankle and joints of unspecified foot: Secondary | ICD-10-CM

## 2018-10-05 DIAGNOSIS — E119 Type 2 diabetes mellitus without complications: Secondary | ICD-10-CM | POA: Diagnosis not present

## 2018-10-05 DIAGNOSIS — M25511 Pain in right shoulder: Secondary | ICD-10-CM

## 2018-10-05 DIAGNOSIS — F3341 Major depressive disorder, recurrent, in partial remission: Secondary | ICD-10-CM | POA: Diagnosis not present

## 2018-10-05 DIAGNOSIS — M5442 Lumbago with sciatica, left side: Secondary | ICD-10-CM

## 2018-10-05 DIAGNOSIS — G8929 Other chronic pain: Secondary | ICD-10-CM

## 2018-10-05 DIAGNOSIS — M25532 Pain in left wrist: Secondary | ICD-10-CM

## 2018-10-05 DIAGNOSIS — E039 Hypothyroidism, unspecified: Secondary | ICD-10-CM

## 2018-10-05 DIAGNOSIS — M25512 Pain in left shoulder: Secondary | ICD-10-CM

## 2018-10-05 DIAGNOSIS — M5441 Lumbago with sciatica, right side: Secondary | ICD-10-CM

## 2018-10-05 DIAGNOSIS — M159 Polyosteoarthritis, unspecified: Secondary | ICD-10-CM

## 2018-10-05 MED ORDER — GLIPIZIDE 10 MG PO TABS: 10.0000 mg | ORAL_TABLET | Freq: Every day | ORAL | 5 refills | Status: DC

## 2018-10-05 MED ORDER — ALPRAZOLAM 0.5 MG PO TABS: 0.5000 mg | ORAL_TABLET | Freq: Two times a day (BID) | ORAL | 1 refills | Status: DC | PRN

## 2018-10-05 NOTE — Patient Instructions (Addendum)
[phone] [employer]  Breakfast: eggs 2-3 Or greek yogurt low fat DANNON 1 slice delightful Terrill Mohr bread  Lunch: 2 slice Terrill Mohr delightfully bread       Or Nature's Own Light 4 ounces chicken, [place], roast beef 1 slice Thin sliced cheese [place] Mustard ok 1 piece of fruit  Supper: 6 ounces lean meat 2 cups raw/cooked veg or 1 cup pintos, corn lima  3 snacks 100 calories or less  Carbohydrate Counting for Diabetes Mellitus, Adult Carbohydrate counting is a method for keeping track of how many carbohydrates you eat. Eating carbohydrates naturally increases the amount of sugar (glucose) in the blood. Counting how many carbohydrates you eat helps keep your blood glucose within normal limits, which helps you manage your diabetes (diabetes mellitus). It is important to know how many carbohydrates you can safely have in each meal. This is different for every person. A diet and nutrition specialist (registered dietitian) can help you make a meal plan and calculate how many carbohydrates you should have at each meal and snack. Carbohydrates are found in the following foods:  Grains, such as breads and cereals.  Dried beans and soy products.  Starchy vegetables, such as potatoes, peas, and corn.  Fruit and fruit juices.  Milk and yogurt.  Sweets and snack foods, such as cake, cookies, candy, chips, and soft drinks.  How do I count carbohydrates? There are two ways to count carbohydrates in food. You can use either of the methods or a combination of both. Reading "Nutrition Facts" on packaged food The "Nutrition Facts" list is included on the labels of almost all packaged foods and beverages in the [place] It includes:  The serving size.  Information about nutrients in each serving, including the grams (g) of carbohydrate per serving.  To use the "Nutrition Facts":  Decide how many servings you will have.  Multiply the number of servings by the number of carbohydrates per  serving.  The resulting number is the total amount of carbohydrates that you will be having.  Learning standard serving sizes of other foods When you eat foods containing carbohydrates that are not packaged or do not include "Nutrition Facts" on the label, you need to measure the servings in order to count the amount of carbohydrates:  Measure the foods that you will eat with a food scale or measuring cup, if needed.  Decide how many standard-size servings you will eat.  Multiply the number of servings by 15. Most carbohydrate-rich foods have about 15 g of carbohydrates per serving. ? For example, if you eat 8 oz (170 g) of strawberries, you will have eaten 2 servings and 30 g of carbohydrates (2 servings x 15 g = 30 g).  For foods that have more than one food mixed, such as soups and casseroles, you must count the carbohydrates in each food that is included.  The following list contains standard serving sizes of common carbohydrate-rich foods. Each of these servings has about 15 g of carbohydrates:   hamburger bun or  English muffin.   oz (15 mL) syrup.   oz (14 g) jelly.  1 slice of bread.  1 six-inch tortilla.  3 oz (85 g) cooked rice or pasta.  4 oz (113 g) cooked dried beans.  4 oz (113 g) starchy vegetable, such as peas, corn, or potatoes.  4 oz (113 g) hot cereal.  4 oz (113 g) mashed potatoes or  of a large baked potato.  4 oz (113 g) canned  or frozen fruit.  4 oz (120 mL) fruit juice.  4-6 crackers.  6 chicken nuggets.  6 oz (170 g) unsweetened dry cereal.  6 oz (170 g) plain fat-free yogurt or yogurt sweetened with artificial sweeteners.  8 oz (240 mL) milk.  8 oz (170 g) fresh fruit or one small piece of fruit.  24 oz (680 g) popped popcorn.  Example of carbohydrate counting Sample meal  3 oz (85 g) chicken breast.  6 oz (170 g) brown rice.  4 oz (113 g) corn.  8 oz (240 mL) milk.  8 oz (170 g) strawberries with sugar-free whipped  topping. Carbohydrate calculation 1. Identify the foods that contain carbohydrates: ? Rice. ? Corn. ? Milk. ? Strawberries. 2. Calculate how many servings you have of each food: ? 2 servings rice. ? 1 serving corn. ? 1 serving milk. ? 1 serving strawberries. 3. Multiply each number of servings by 15 g: ? 2 servings rice x 15 g = 30 g. ? 1 serving corn x 15 g = 15 g. ? 1 serving milk x 15 g = 15 g. ? 1 serving strawberries x 15 g = 15 g. 4. Add together all of the amounts to find the total grams of carbohydrates eaten: ? 30 g + 15 g + 15 g + 15 g = 75 g of carbohydrates total. This information is not intended to replace advice given to you by your health care provider. Make sure you discuss any questions you have with your health care provider. Document Released: 10/31/2005 Document Revised: 05/20/2016 Document Reviewed: 04/13/2016 Elsevier Interactive Patient Education  [employer]2018 [employer] Inc.
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
## 2019-01-25 ENCOUNTER — Other Ambulatory Visit: Payer: Self-pay | Admitting: Physician Assistant

## 2019-01-25 DIAGNOSIS — I10 Essential (primary) hypertension: Secondary | ICD-10-CM

## 2019-02-01 ENCOUNTER — Ambulatory Visit: Payer: BLUE CROSS/BLUE SHIELD | Admitting: Physician Assistant

## 2019-03-14 ENCOUNTER — Telehealth: Payer: Self-pay | Admitting: Physician Assistant

## 2019-04-01 ENCOUNTER — Encounter: Payer: Self-pay | Admitting: Family Medicine

## 2019-04-01 ENCOUNTER — Other Ambulatory Visit: Payer: Self-pay

## 2019-04-01 ENCOUNTER — Ambulatory Visit (INDEPENDENT_AMBULATORY_CARE_PROVIDER_SITE_OTHER): Payer: BLUE CROSS/BLUE SHIELD | Admitting: Family Medicine

## 2019-04-01 DIAGNOSIS — E119 Type 2 diabetes mellitus without complications: Secondary | ICD-10-CM | POA: Diagnosis not present

## 2019-04-01 DIAGNOSIS — T7840XA Allergy, unspecified, initial encounter: Secondary | ICD-10-CM | POA: Diagnosis not present

## 2019-04-01 DIAGNOSIS — E039 Hypothyroidism, unspecified: Secondary | ICD-10-CM | POA: Diagnosis not present

## 2019-04-01 DIAGNOSIS — M797 Fibromyalgia: Secondary | ICD-10-CM | POA: Diagnosis not present

## 2019-04-01 DIAGNOSIS — W57XXXA Bitten or stung by nonvenomous insect and other nonvenomous arthropods, initial encounter: Secondary | ICD-10-CM

## 2019-04-01 DIAGNOSIS — R110 Nausea: Secondary | ICD-10-CM | POA: Diagnosis not present

## 2019-04-01 LAB — URINALYSIS, COMPLETE
Bilirubin, UA: NEGATIVE
Nitrite, UA: NEGATIVE
Protein,UA: NEGATIVE
Specific Gravity, UA: <1.005 — ABNORMAL LOW (ref 1.005–1.030)
Urobilinogen, Ur: 0.2 mg/dL (ref 0.2–1.0)
pH, UA: 5.5 (ref 5.0–7.5)

## 2019-04-01 LAB — MICROSCOPIC EXAMINATION: Renal Epithel, UA: NONE SEEN /hpf

## 2019-04-01 LAB — BAYER DCA HB A1C WAIVED: HB A1C (BAYER DCA - WAIVED): 13.7 % — ABNORMAL HIGH (ref <7.0)

## 2019-04-01 MED ORDER — DOXYCYCLINE HYCLATE 100 MG PO CAPS: 100.0000 mg | ORAL_CAPSULE | Freq: Two times a day (BID) | ORAL | 0 refills | Status: DC

## 2019-04-01 MED ORDER — PREDNISONE 10 MG PO TABS: ORAL_TABLET | ORAL | 0 refills | Status: DC

## 2019-04-01 NOTE — Progress Notes (Signed)
Subjective:    Patient ID: Cynthia Espinoza, female    DOB: [birth year], 63 y.o.   MRN: [id number]   HPI: ARIAN MCQUITTY is a 63 y.o. female presenting for Onset 1 month ago got a tick off of her. Now a lot of nausea. Drinking more. Mouth dry. Then she had a rash on her arms, then chest stomach and legs. Had eaten strawberries at onset. Never had reaction to strawberries before. Used a cream prescribed in the past by Particia Nearing on rash and most of it is drying up. Lips cracked open. Tongue is raw.Run down, no energy. Smell of food makes her nauseated now.    Depression screen [facility] 2/9 10/05/2018 04/12/2018 05/12/2017 01/24/2017 01/10/2017  Decreased Interest 0 1 0 0 0  Down, Depressed, Hopeless 0 1 0 0 0  PHQ - 2 Score 0 2 0 0 0  Altered sleeping - 1 - - -  Tired, decreased energy - 1 - - -  Change in appetite - 3 - - -  Feeling bad or failure about yourself  - 1 - - -  Trouble concentrating - 2 - - -  Moving slowly or fidgety/restless - 2 - - -  Suicidal thoughts - 1 - - -  PHQ-9 Score - 13 - - -     Relevant past medical, surgical, family and social history reviewed and updated as indicated.  Interim medical history since our last visit reviewed. Allergies and medications reviewed and updated.  ROS:  Review of Systems  Constitutional: Positive for activity change, appetite change and fatigue. Negative for fever.  HENT: Positive for facial swelling (lips and tongue, a little). Negative for congestion.   Eyes: Negative for visual disturbance.  Respiratory: Negative for shortness of breath.   Cardiovascular: Negative for chest pain and leg swelling.  Gastrointestinal: Negative for abdominal pain, constipation, diarrhea, nausea and vomiting.  Genitourinary: Negative for difficulty urinating, dysuria and hematuria.  Musculoskeletal: Positive for arthralgias (no change from usual - fibromyalia pain) and myalgias.  Neurological: Negative for headaches.  Psychiatric/Behavioral: Negative  for sleep disturbance.     Social History   Tobacco Use  Smoking Status Never Smoker  Smokeless Tobacco Never Used       Objective:     Wt Readings from Last 3 Encounters:  10/05/18 (!) 320 lb 6.4 oz (145.3 kg)  04/12/18 (!) 314 lb 6.4 oz (142.6 kg)  05/12/17 (!) 307 lb 12.8 oz (139.6 kg)     Exam deferred. Pt. Harboring due to COVID 19. Phone visit performed.   Assessment & Plan:   1. Hypothyroidism, unspecified type   2. Fibromyalgia   3. Diabetes mellitus without complication ([place])   4. Allergic reaction, initial encounter   5. Tick bite, initial encounter     Meds ordered this encounter  Medications  . doxycycline (VIBRAMYCIN) 100 MG capsule    Sig: Take 1 capsule (100 mg total) by mouth 2 (two) times daily.    Dispense:  28 capsule    Refill:  0  . predniSONE (DELTASONE) 10 MG tablet    Sig: Take 5 daily for 2 days followed by 4,3,2 and 1 for 2 days each.    Dispense:  30 tablet    Refill:  0    Orders Placed This Encounter  Procedures  . Allergen, Strawberry, f44  . CBC with Differential/Platelet  . CMP14+EGFR    Order Specific Question:   Has the patient fasted?    Answer:  Yes  . Urinalysis, Complete  . Lyme Ab/Western Blot Reflex  . Rocky mtn spotted fvr abs pnl(IgG+IgM)  . Alpha-Gal Panel  . Bayer DCA Hb A1c Waived  . TSH      Diagnoses and all orders for this visit:  Hypothyroidism, unspecified type -     CMP14+EGFR -     TSH  Fibromyalgia -     CMP14+EGFR  Diabetes mellitus without complication (HCC) -     Allergen, Strawberry, f44 -     CBC with Differential/Platelet -     CMP14+EGFR -     Urinalysis, Complete -     Lyme Ab/Western Blot Reflex -     Rocky mtn spotted fvr abs pnl(IgG+IgM) -     Alpha-Gal Panel -     Bayer DCA Hb A1c Waived -     TSH  Allergic reaction, initial encounter -     Allergen, Strawberry, f44 -     CBC with Differential/Platelet -     CMP14+EGFR  Tick bite, initial encounter -     Lyme  Ab/Western Blot Reflex -     Rocky mtn spotted fvr abs pnl(IgG+IgM) -     Alpha-Gal Panel  Other orders -     doxycycline (VIBRAMYCIN) 100 MG capsule; Take 1 capsule (100 mg total) by mouth 2 (two) times daily. -     predniSONE (DELTASONE) 10 MG tablet; Take 5 daily for 2 days followed by 4,3,2 and 1 for 2 days each.    Virtual Visit via telephone Note  I discussed the limitations, risks, security and privacy concerns of performing an evaluation and management service by telephone and the availability of in person appointments. The patient was identified with two identifiers. Pt.expressed understanding and agreed to proceed. Pt. Is at home. Dr. Livia Snellen is in his office.  Follow Up Instructions:   I discussed the assessment and treatment plan with the patient. The patient was provided an opportunity to ask questions and all were answered. The patient agreed with the plan and demonstrated an understanding of the instructions.   The patient was advised to call back or seek an in-person evaluation if the symptoms worsen or if the condition fails to improve as anticipated. 24  Total minutes including chart review and phone contact time: 24   Follow up plan: Return in about 2 weeks (around 04/15/2019) for with [facility] for DM, follow up of rash.  Claretta Fraise, MD [place]

## 2019-04-02 ENCOUNTER — Encounter (HOSPITAL_COMMUNITY): Payer: Self-pay | Admitting: Emergency Medicine

## 2019-04-02 ENCOUNTER — Other Ambulatory Visit: Payer: Self-pay

## 2019-04-02 ENCOUNTER — Inpatient Hospital Stay (HOSPITAL_COMMUNITY)
Admission: EM | Admit: 2019-04-02 | Discharge: 2019-04-05 | DRG: 638 | Disposition: A | Discharge status: Home / Self Care | Payer: BLUE CROSS/BLUE SHIELD | Attending: Internal Medicine | Admitting: Internal Medicine

## 2019-04-02 DIAGNOSIS — E1310 Other specified diabetes mellitus with ketoacidosis without coma: Secondary | ICD-10-CM

## 2019-04-02 DIAGNOSIS — B370 Candidal stomatitis: Secondary | ICD-10-CM | POA: Diagnosis not present

## 2019-04-02 DIAGNOSIS — G8929 Other chronic pain: Secondary | ICD-10-CM | POA: Diagnosis present

## 2019-04-02 DIAGNOSIS — T383X6A Underdosing of insulin and oral hypoglycemic [antidiabetic] drugs, initial encounter: Secondary | ICD-10-CM | POA: Diagnosis present

## 2019-04-02 DIAGNOSIS — E876 Hypokalemia: Secondary | ICD-10-CM | POA: Diagnosis present

## 2019-04-02 DIAGNOSIS — M25561 Pain in right knee: Secondary | ICD-10-CM | POA: Diagnosis present

## 2019-04-02 DIAGNOSIS — M5441 Lumbago with sciatica, right side: Secondary | ICD-10-CM | POA: Diagnosis present

## 2019-04-02 DIAGNOSIS — E1110 Type 2 diabetes mellitus with ketoacidosis without coma: Secondary | ICD-10-CM | POA: Diagnosis not present

## 2019-04-02 DIAGNOSIS — M159 Polyosteoarthritis, unspecified: Secondary | ICD-10-CM | POA: Diagnosis present

## 2019-04-02 DIAGNOSIS — N179 Acute kidney failure, unspecified: Secondary | ICD-10-CM | POA: Diagnosis present

## 2019-04-02 DIAGNOSIS — M25562 Pain in left knee: Secondary | ICD-10-CM | POA: Diagnosis present

## 2019-04-02 DIAGNOSIS — R238 Other skin changes: Secondary | ICD-10-CM | POA: Diagnosis present

## 2019-04-02 DIAGNOSIS — Z6841 Body Mass Index (BMI) 40.0 and over, adult: Secondary | ICD-10-CM | POA: Diagnosis not present

## 2019-04-02 DIAGNOSIS — E860 Dehydration: Secondary | ICD-10-CM

## 2019-04-02 DIAGNOSIS — Z8349 Family history of other endocrine, nutritional and metabolic diseases: Secondary | ICD-10-CM

## 2019-04-02 DIAGNOSIS — E039 Hypothyroidism, unspecified: Secondary | ICD-10-CM | POA: Diagnosis not present

## 2019-04-02 DIAGNOSIS — Z8249 Family history of ischemic heart disease and other diseases of the circulatory system: Secondary | ICD-10-CM

## 2019-04-02 DIAGNOSIS — Z03818 Encounter for observation for suspected exposure to other biological agents ruled out: Secondary | ICD-10-CM | POA: Diagnosis not present

## 2019-04-02 DIAGNOSIS — Z9114 Patient's other noncompliance with medication regimen: Secondary | ICD-10-CM

## 2019-04-02 DIAGNOSIS — Z79890 Hormone replacement therapy (postmenopausal): Secondary | ICD-10-CM

## 2019-04-02 DIAGNOSIS — F4310 Post-traumatic stress disorder, unspecified: Secondary | ICD-10-CM | POA: Diagnosis present

## 2019-04-02 DIAGNOSIS — F419 Anxiety disorder, unspecified: Secondary | ICD-10-CM | POA: Diagnosis not present

## 2019-04-02 DIAGNOSIS — M5442 Lumbago with sciatica, left side: Secondary | ICD-10-CM | POA: Diagnosis present

## 2019-04-02 DIAGNOSIS — M797 Fibromyalgia: Secondary | ICD-10-CM | POA: Diagnosis present

## 2019-04-02 DIAGNOSIS — I10 Essential (primary) hypertension: Secondary | ICD-10-CM | POA: Diagnosis not present

## 2019-04-02 DIAGNOSIS — Z91128 Patient's intentional underdosing of medication regimen for other reason: Secondary | ICD-10-CM

## 2019-04-02 DIAGNOSIS — E1169 Type 2 diabetes mellitus with other specified complication: Secondary | ICD-10-CM

## 2019-04-02 DIAGNOSIS — Z1159 Encounter for screening for other viral diseases: Secondary | ICD-10-CM

## 2019-04-02 DIAGNOSIS — E8881 Metabolic syndrome: Secondary | ICD-10-CM | POA: Diagnosis not present

## 2019-04-02 DIAGNOSIS — Z886 Allergy status to analgesic agent status: Secondary | ICD-10-CM

## 2019-04-02 DIAGNOSIS — Z8659 Personal history of other mental and behavioral disorders: Secondary | ICD-10-CM | POA: Diagnosis not present

## 2019-04-02 DIAGNOSIS — Y92009 Unspecified place in unspecified non-institutional (private) residence as the place of occurrence of the external cause: Secondary | ICD-10-CM | POA: Diagnosis not present

## 2019-04-02 DIAGNOSIS — Z880 Allergy status to penicillin: Secondary | ICD-10-CM

## 2019-04-02 DIAGNOSIS — Z79899 Other long term (current) drug therapy: Secondary | ICD-10-CM

## 2019-04-02 LAB — CBC
HCT: 47 % — ABNORMAL HIGH (ref 36.0–46.0)
Hemoglobin: 14.9 g/dL (ref 12.0–15.0)
MCH: 31.3 pg (ref 26.0–34.0)
MCHC: 31.7 g/dL (ref 30.0–36.0)
MCV: 98.7 fL (ref 80.0–100.0)
Platelets: 260 10*3/uL (ref 150–400)
RBC: 4.76 MIL/uL (ref 3.87–5.11)
RDW: 13.1 % (ref 11.5–15.5)
WBC: 11.4 10*3/uL — ABNORMAL HIGH (ref 4.0–10.5)
nRBC: 0 % (ref 0.0–0.2)

## 2019-04-02 LAB — URINALYSIS, ROUTINE W REFLEX MICROSCOPIC
Bilirubin Urine: NEGATIVE
Glucose, UA: >=500 mg/dL — AB
Hgb urine dipstick: NEGATIVE
Ketones, ur: 20 mg/dL — AB
Nitrite: NEGATIVE
Protein, ur: NEGATIVE mg/dL
Specific Gravity, Urine: 1.025 (ref 1.005–1.030)
pH: 5 (ref 5.0–8.0)

## 2019-04-02 LAB — BASIC METABOLIC PANEL
Anion gap: 20 — ABNORMAL HIGH (ref 5–15)
BUN: 48 mg/dL — ABNORMAL HIGH (ref 6–20)
CO2: 20 mmol/L — ABNORMAL LOW (ref 22–32)
Calcium: 9.3 mg/dL (ref 8.9–10.3)
Chloride: 91 mmol/L — ABNORMAL LOW (ref 98–111)
Creatinine, Ser: 1.43 mg/dL — ABNORMAL HIGH (ref 0.44–1.00)
GFR calc Af Amer: 46 mL/min — ABNORMAL LOW (ref >60)
GFR calc non Af Amer: 40 mL/min — ABNORMAL LOW (ref >60)
Glucose, Bld: 930 mg/dL (ref 70–99)
Potassium: 4.5 mmol/L (ref 3.5–5.1)
Sodium: 131 mmol/L — ABNORMAL LOW (ref 135–145)

## 2019-04-02 LAB — CBG MONITORING, ED
Glucose-Capillary: >600 mg/dL (ref 70–99)
Glucose-Capillary: >600 mg/dL (ref 70–99)
Glucose-Capillary: >600 mg/dL (ref 70–99)
Glucose-Capillary: 547 mg/dL (ref 70–99)

## 2019-04-02 LAB — SARS CORONAVIRUS 2 BY RT PCR (HOSPITAL ORDER, PERFORMED IN ~~LOC~~ HOSPITAL LAB): SARS Coronavirus 2: NEGATIVE

## 2019-04-02 MED ORDER — DEXTROSE-NACL 5-0.45 % IV SOLN: INTRAVENOUS | Status: DC

## 2019-04-02 MED ORDER — INSULIN REGULAR BOLUS VIA INFUSION
0.0000 [IU] | Freq: Three times a day (TID) | INTRAVENOUS | Status: DC
  Filled 2019-04-02: qty 10

## 2019-04-02 MED ORDER — SODIUM CHLORIDE 0.9 % IV SOLN
INTRAVENOUS | Status: DC
  Administered 2019-04-02: 21:00:00 125 mL/h via INTRAVENOUS

## 2019-04-02 MED ORDER — INSULIN REGULAR(HUMAN) IN NACL 100-0.9 UT/100ML-% IV SOLN
INTRAVENOUS | Status: DC
  Administered 2019-04-02: 21:00:00 5.4 [IU]/h via INTRAVENOUS
  Filled 2019-04-02: qty 100

## 2019-04-02 MED ORDER — AMLODIPINE BESYLATE 5 MG PO TABS
10.0000 mg | ORAL_TABLET | Freq: Every day | ORAL | Status: DC
  Administered 2019-04-03 – 2019-04-05 (×3): 10 mg via ORAL
  Filled 2019-04-02 (×3): qty 2

## 2019-04-02 MED ORDER — SODIUM CHLORIDE 0.9 % IV BOLUS
2000.0000 mL | Freq: Once | INTRAVENOUS | Status: AC
  Administered 2019-04-02: 20:00:00 2000 mL via INTRAVENOUS

## 2019-04-02 MED ORDER — DEXTROSE-NACL 5-0.45 % IV SOLN
INTRAVENOUS | Status: DC
  Administered 2019-04-03: 04:00:00 via INTRAVENOUS

## 2019-04-02 MED ORDER — DEXTROSE 50 % IV SOLN: 25.0000 mL | INTRAVENOUS | Status: DC | PRN

## 2019-04-02 MED ORDER — POTASSIUM CHLORIDE 10 MEQ/100ML IV SOLN
10.0000 meq | INTRAVENOUS | Status: AC
  Administered 2019-04-03 (×2): 10 meq via INTRAVENOUS
  Filled 2019-04-02 (×2): qty 100

## 2019-04-02 MED ORDER — INSULIN REGULAR(HUMAN) IN NACL 100-0.9 UT/100ML-% IV SOLN
INTRAVENOUS | Status: DC
  Administered 2019-04-03: 03:00:00 10 [IU]/h via INTRAVENOUS
  Filled 2019-04-02: qty 100

## 2019-04-02 MED ORDER — DOXYCYCLINE HYCLATE 100 MG PO TABS: 100.0000 mg | ORAL_TABLET | Freq: Two times a day (BID) | ORAL | Status: DC

## 2019-04-02 MED ORDER — ENOXAPARIN SODIUM 80 MG/0.8ML ~~LOC~~ SOLN
0.5000 mg/kg | SUBCUTANEOUS | Status: DC
  Administered 2019-04-03 – 2019-04-04 (×3): 65 mg via SUBCUTANEOUS
  Filled 2019-04-02 (×3): qty 0.8

## 2019-04-02 MED ORDER — SODIUM CHLORIDE 0.9 % IV SOLN
INTRAVENOUS | Status: DC
  Administered 2019-04-03: 01:00:00 via INTRAVENOUS

## 2019-04-02 MED ORDER — LEVOTHYROXINE SODIUM 100 MCG PO TABS
200.0000 ug | ORAL_TABLET | Freq: Every day | ORAL | Status: DC
  Administered 2019-04-03 – 2019-04-05 (×3): 200 ug via ORAL
  Filled 2019-04-02 (×4): qty 2

## 2019-04-02 NOTE — ED Notes (Signed)
CRITICAL VALUE ALERT  Critical Value:  Glucose 930  Date & Time Notied:  04/02/2019 2018  Provider Notified: Dr. Effie Shy  Orders Received/Actions taken: started glucose stabilizer

## 2019-04-02 NOTE — ED Provider Notes (Signed)
[facility] EMERGENCY DEPARTMENT Provider Note   CSN: [id number] Arrival date & time: 04/02/19  1807    History   Chief Complaint Chief Complaint  Patient presents with  . Hyperglycemia    HPI Cynthia Espinoza is a 63 y.o. female.     HPI  Patient here after getting blood tested by Dr. yesterday and informed today of hyperglycemia.  Patient states she is not on a low carbohydrate diet, or taking her oral hypoglycemic, by her choice.  This is been ongoing for many months.  Recently she has been more tired than usual and occasionally falls asleep while eating.  He is also had some urinary frequency but no dysuria.  She denies fever, chills, cough, shortness of breath, focal weakness or paresthesia.  There are no other known modifying factors.  Past Medical History:  Diagnosis Date  . Anxiety   . Depression   . Hypertension   . Thyroid disease     Patient Active Problem List   Diagnosis Date Noted  . History of posttraumatic stress disorder (PTSD) 10/05/2018  . Palpitations 05/12/2017  . Premature beat 05/12/2017  . Moderate episode of recurrent major depressive disorder (HCC) 02/16/2017  . Recurrent major depressive disorder, in partial remission (HCC) 01/24/2017  . Diabetes mellitus without complication (HCC) 01/10/2017  . Essential hypertension 08/30/2016  . Hypothyroidism 08/30/2016  . Generalized OA 08/30/2016  . Fibromyalgia 08/30/2016  . Chronic bilateral low back pain with bilateral sciatica 08/30/2016  . Chronic pain of both knees 08/30/2016  . Pain in both wrists 08/30/2016  . Pain in joint involving ankle and foot 08/30/2016  . Pain of both shoulder joints 08/30/2016  . Morbid obesity (HCC) 08/30/2016    Past Surgical History:  Procedure Laterality Date  . CESAREAN SECTION       OB History   No obstetric history on file.      Home Medications    Prior to Admission medications   Medication Sig Start Date End Date Taking? Authorizing Provider   ALPRAZolam Prudy Feeler) 0.5 MG tablet Take 1 tablet (0.5 mg total) by mouth 2 (two) times daily as needed for anxiety. 10/05/18  Yes Remus Loffler, PA-C  amLODipine (NORVASC) 10 MG tablet TAKE ONE (1) TABLET EACH DAY Patient taking differently: Take 10 mg by mouth daily.  01/28/19  Yes Remus Loffler, PA-C  benazepril (LOTENSIN) 40 MG tablet TAKE ONE (1) TABLET EACH DAY Patient taking differently: Take 40 mg by mouth daily.  01/28/19  Yes Remus Loffler, PA-C  doxycycline (VIBRAMYCIN) 100 MG capsule Take 1 capsule (100 mg total) by mouth 2 (two) times daily. 04/01/19  Yes Stacks, Broadus John, MD  hydrochlorothiazide (HYDRODIURIL) 25 MG tablet TAKE ONE (1) TABLET EACH DAY Patient taking differently: Take 25 mg by mouth daily.  01/28/19  Yes Remus Loffler, PA-C  levothyroxine (SYNTHROID, LEVOTHROID) 200 MCG tablet TAKE ONE TABLET EACH MORNING BEFORE BREAKFAST Patient taking differently: Take 200 mcg by mouth daily before breakfast.  04/12/18  Yes Remus Loffler, PA-C  predniSONE (DELTASONE) 10 MG tablet Take 5 daily for 2 days followed by 4,3,2 and 1 for 2 days each. Patient taking differently: Take 10-50 mg by mouth See admin instructions. Take 5 daily for 2 days followed by 4,3,2 and 1 for 2 days each starting on 04/01/2019 04/01/19  Yes Stacks, Broadus John, MD  glipiZIDE (GLUCOTROL) 10 MG tablet Take 1 tablet (10 mg total) by mouth daily before breakfast. Patient not taking: Reported on 04/02/2019 10/05/18  Remus LofflerJones, Angel S, PA-C    Family History Family History  Problem Relation Age of Onset  . Hypertension Mother   . Thyroid disease Mother   . Osteoporosis Mother     Social History Social History   Tobacco Use  . Smoking status: Never Smoker  . Smokeless tobacco: Never Used  Substance Use Topics  . Alcohol use: No  . Drug use: No     Allergies   Asa [aspirin] and Penicillins   Review of Systems Review of Systems  All other systems reviewed and are negative.    Physical Exam Updated Vital  Signs BP 125/65 (BP Location: Right Arm)   Pulse 87   Temp 97.9 F (36.6 C) (Oral)   Resp 17   Ht 5\' 7"  (1.702 m)   Wt 136.1 kg   SpO2 94%   BMI 46.99 kg/m   Physical Exam Vitals signs and nursing note reviewed.  Constitutional:      General: She is not in acute distress.    Appearance: She is well-developed. She is obese. She is not ill-appearing, toxic-appearing or diaphoretic.  HENT:     Head: Normocephalic and atraumatic.     Right Ear: External ear normal.     Left Ear: External ear normal.     Mouth/Throat:     Mouth: Mucous membranes are moist.  Eyes:     Conjunctiva/sclera: Conjunctivae normal.     Pupils: Pupils are equal, round, and reactive to light.  Neck:     Musculoskeletal: Normal range of motion and neck supple.     Trachea: Phonation normal.  Cardiovascular:     Rate and Rhythm: Normal rate and regular rhythm.     Heart sounds: Normal heart sounds.  Pulmonary:     Effort: Pulmonary effort is normal.     Breath sounds: Normal breath sounds.  Abdominal:     General: There is no distension.     Palpations: Abdomen is soft. There is no mass.     Tenderness: There is no abdominal tenderness.  Musculoskeletal: Normal range of motion.        General: No swelling or tenderness.  Skin:    General: Skin is warm and dry.  Neurological:     Mental Status: She is alert and oriented to person, place, and time.     Cranial Nerves: No cranial nerve deficit.     Sensory: No sensory deficit.     Motor: No abnormal muscle tone.     Coordination: Coordination normal.  Psychiatric:        Mood and Affect: Mood normal.        Behavior: Behavior normal.        Thought Content: Thought content normal.        Judgment: Judgment normal.      ED Treatments / Results  Labs (all labs ordered are listed, but only abnormal results are displayed) Labs Reviewed  BASIC METABOLIC PANEL - Abnormal; Notable for the following components:      Result Value   Sodium 131 (*)     Chloride 91 (*)    CO2 20 (*)    Glucose, Bld 930 (*)    BUN 48 (*)    Creatinine, Ser 1.43 (*)    GFR calc non Af Amer 40 (*)    GFR calc Af Amer 46 (*)    Anion gap 20 (*)    All other components within normal limits  CBC - Abnormal; Notable for the  following components:   WBC 11.4 (*)    HCT 47.0 (*)    All other components within normal limits  URINALYSIS, ROUTINE W REFLEX MICROSCOPIC - Abnormal; Notable for the following components:   Color, Urine STRAW (*)    Glucose, UA >=500 (*)    Ketones, ur 20 (*)    Leukocytes,Ua SMALL (*)    Bacteria, UA RARE (*)    All other components within normal limits  CBG MONITORING, ED - Abnormal; Notable for the following components:   Glucose-Capillary >600 (*)    All other components within normal limits  CBG MONITORING, ED - Abnormal; Notable for the following components:   Glucose-Capillary >600 (*)    All other components within normal limits  SARS CORONAVIRUS 2 (HOSPITAL ORDER, PERFORMED IN [facility])    EKG None  Radiology No results found.  Procedures .Critical Care Performed by: Mancel Bale, MD Authorized by: Mancel Bale, MD   Critical care provider statement:    Critical care time (minutes):  35   Critical care start time:  04/02/2019 6:40 PM   Critical care end time:  04/02/2019 9:21 PM   Critical care time was exclusive of:  Separately billable procedures and treating other patients   Critical care was necessary to treat or prevent imminent or life-threatening deterioration of the following conditions:  Metabolic crisis   Critical care was time spent personally by me on the following activities:  Blood draw for specimens, development of treatment plan with patient or surrogate, discussions with consultants, evaluation of patient's response to treatment, examination of patient, obtaining history from patient or surrogate, ordering and performing treatments and interventions, ordering and review of  laboratory studies, pulse oximetry, re-evaluation of patient's condition, review of old charts and ordering and review of radiographic studies   (including critical care time)  Medications Ordered in ED Medications  dextrose 5 %-0.45 % sodium chloride infusion ( Intravenous Hold 04/02/19 2052)  insulin regular bolus via infusion 0-10 Units (has no administration in time range)  insulin regular, human (MYXREDLIN) 100 units/ 100 mL infusion (5.4 Units/hr Intravenous New Bag/Given 04/02/19 2056)  dextrose 50 % solution 25 mL (has no administration in time range)  0.9 %  sodium chloride infusion (125 mL/hr Intravenous New Bag/Given 04/02/19 2051)  sodium chloride 0.9 % bolus 2,000 mL (0 mLs Intravenous Stopped 04/02/19 2051)     Initial Impression / Assessment and Plan / ED Course  I have reviewed the triage vital signs and the nursing notes.  Pertinent labs & imaging results that were available during my care of the patient were reviewed by me and considered in my medical decision making (see chart for details).  Clinical Course as of Apr 01 2120  Tue Apr 02, 2019  2005 Normal except white count high, hematocrit high  CBC(!) [EW]  2005 Abnormal, high  CBG monitoring, ED(!!) [EW]  2005 Abnormal, glucose high, ketones high, leukocytes elevated, rare bacteria.  Urinalysis, Routine w reflex microscopic(!) [EW]  2025 Normal except sodium low, chloride low, CO2 low, glucose high, BUN high, creatinine high, GFR low, anion gap high  Basic metabolic panel(!!) [EW]    Clinical Course User Index [EW] Mancel Bale, MD        Patient Vitals for the past 24 hrs:  BP Temp Temp src Pulse Resp SpO2 Height Weight  04/02/19 2118 125/65 - - 87 17 94 % - -  04/02/19 2000 136/90 - - 86 - 96 % - -  04/02/19 1827 (!) 110/54 97.9 F (36.6 C) Oral 88 19 97 % 5\' 7"  (1.702 m) 136.1 kg    9:21 PM Reevaluation with update and discussion. After initial assessment and treatment, an updated evaluation  reveals She iscomfortable, agrees to admission. Mancel Bale   Medical Decision Making: Evaluation consistent with uncontrolled diabetes resulting in diabetic ketoacidosis.  Patient's treatment for diabetes was oral hypoglycemic.  She appears to be dehydrated this time.  Her difficulty sleeping, during the day, while eating, may be related to sleep apnea, associated with her metabolic syndrome and obesity.  Suspect that she will require intensive early management possibly insulin for time.  No overt clinical or laboratory signs for infection, causing metabolic instability.  Doubt impending vascular collapse.  She will require hospitalization for further treatment of significant hyperglycemia with DKA and dehydration.  CRITICAL CARE-yes Performed by: Mancel Bale  Nursing Notes Reviewed/ Care Coordinated Applicable Imaging Reviewed Interpretation of Laboratory Data incorporated into ED treatment   9:21 PM-Consult complete with Hospitalist. Patient case explained and discussed. She agrees to admit patient for further evaluation and treatment. Call ended at 9:32 PM  Plan: Admit  Final Clinical Impressions(s) / ED Diagnoses   Final diagnoses:  Diabetic ketoacidosis without coma associated with other specified diabetes mellitus [facility])  Dehydration    ED Discharge Orders    None       Mancel Bale, MD 04/03/19 1139

## 2019-04-02 NOTE — ED Triage Notes (Signed)
Pt states having high A1C per PCP.  States she stopped taking her diabetic medications x 8 months.

## 2019-04-02 NOTE — H&P (Signed)
History and Physical    Cynthia Espinoza YWV:[id number] DOB: 1956/01/12 DOA: 04/02/2019  PCP: Remus Loffler, PA-C   Patient coming from: Home  I have personally briefly reviewed patient's old medical records in [facility]  Chief Complaint: High blood sugar  HPI: Cynthia Espinoza is a 63 y.o. female with medical history significant for pretension, diabetes, depression, PTSD, who was sent to the ED by her primary care provider with reports of high blood sugar and high A1c.  Patient is a nondiabetic and she supposed to be on glipizide, but she has not taken this medication in 8 months.  She said the medication caused some abdominal discomfort and a friend told her that she needed to take the medication for just 1 month and then she would not need it anymore. She reports increased volume and frequency of urine over the past several days, increased thirst, easy fatigability, sleeping while eating and sometimes once while driving.  Patient saw her primary care provider yesterday also for a rash on her lower extremities for which she was prescribed prednisone with taper and doxycycline, and blood work to rule out disease and [facility] spotted fever was obtained..  She reports she saw a tick on her right flank about a month ago which she removed, she subsequently did not see any rash on the same spot but about 3 to 4 weeks later she developed a rash mostly on her legs some on her abdomen arms.   No Fever, no chills, no burning with urination, no difficulty breathing, no chest pain.  ED Course: Stable vitals.  Glucose 930, with anion gap 20, serum bicarb 20.  Sodium 131.  Creatinine elevated 1.43.  WBC 11.4.  UA with glucose, small leukocytes rare bacteria.  Insulin GTT started in ED.  2 L bolus normal saline given.  Hospitalist to admit for DKA.  Review of Systems: As per HPI all other systems reviewed and negative.  Past Medical History:  Diagnosis Date  . Anxiety   . Depression   .  Hypertension   . Thyroid disease     Past Surgical History:  Procedure Laterality Date  . CESAREAN SECTION       reports that she has never smoked. She has never used smokeless tobacco. She reports that she does not drink alcohol or use drugs.  Allergies  Allergen Reactions  . Asa [Aspirin] Rash  . Penicillins Rash    Did it involve swelling of the face/tongue/throat, SOB, or low BP? Yes Did it involve sudden or severe rash/hives, skin peeling, or any reaction on the inside of your mouth or nose? Yes Did you need to seek medical attention at a hospital or doctor's office? Yes When did it last happen?Over 10 years ago If all above answers are "NO", may proceed with cephalosporin use.     Family History  Problem Relation Age of Onset  . Hypertension Mother   . Thyroid disease Mother   . Osteoporosis Mother    Family history of hypertension.  Prior to Admission medications   Medication Sig Start Date End Date Taking? Authorizing Provider  ALPRAZolam Prudy Feeler) 0.5 MG tablet Take 1 tablet (0.5 mg total) by mouth 2 (two) times daily as needed for anxiety. 10/05/18  Yes Remus Loffler, PA-C  amLODipine (NORVASC) 10 MG tablet TAKE ONE (1) TABLET EACH DAY Patient taking differently: Take 10 mg by mouth daily.  01/28/19  Yes Remus Loffler, PA-C  benazepril (LOTENSIN) 40 MG tablet  TAKE ONE (1) TABLET EACH DAY Patient taking differently: Take 40 mg by mouth daily.  01/28/19  Yes Remus LofflerJones, Angel S, PA-C  doxycycline (VIBRAMYCIN) 100 MG capsule Take 1 capsule (100 mg total) by mouth 2 (two) times daily. 04/01/19  Yes Stacks, Broadus JohnWarren, MD  hydrochlorothiazide (HYDRODIURIL) 25 MG tablet TAKE ONE (1) TABLET EACH DAY Patient taking differently: Take 25 mg by mouth daily.  01/28/19  Yes Remus LofflerJones, Angel S, PA-C  levothyroxine (SYNTHROID, LEVOTHROID) 200 MCG tablet TAKE ONE TABLET EACH MORNING BEFORE BREAKFAST Patient taking differently: Take 200 mcg by mouth daily before breakfast.  04/12/18  Yes Remus LofflerJones,  Angel S, PA-C  predniSONE (DELTASONE) 10 MG tablet Take 5 daily for 2 days followed by 4,3,2 and 1 for 2 days each. Patient taking differently: Take 10-50 mg by mouth See admin instructions. Take 5 daily for 2 days followed by 4,3,2 and 1 for 2 days each starting on 04/01/2019 04/01/19  Yes Stacks, Broadus JohnWarren, MD  glipiZIDE (GLUCOTROL) 10 MG tablet Take 1 tablet (10 mg total) by mouth daily before breakfast. Patient not taking: Reported on 04/02/2019 10/05/18   Remus LofflerJones, Angel S, PA-C    Physical Exam: Vitals:   04/02/19 1827 04/02/19 2000 04/02/19 2118  BP: (!) 110/54 136/90 125/65  Pulse: 88 86 87  Resp: 19  17  Temp: 97.9 F (36.6 C)    TempSrc: Oral    SpO2: 97% 96% 94%  Weight: 136.1 kg    Height: 5\' 7"  (1.702 m)      Constitutional: NAD, calm, comfortable Vitals:   04/02/19 1827 04/02/19 2000 04/02/19 2118  BP: (!) 110/54 136/90 125/65  Pulse: 88 86 87  Resp: 19  17  Temp: 97.9 F (36.6 C)    TempSrc: Oral    SpO2: 97% 96% 94%  Weight: 136.1 kg    Height: 5\' 7"  (1.702 m)     Eyes: PERRL, lids and conjunctivae normal ENMT: Mucous membranes are dry Posterior pharynx clear of any exudate or lesions. Neck: normal, supple, no masses, no thyromegaly Respiratory: clear to auscultation bilaterally, no wheezing, no crackles. Normal respiratory effort. No accessory muscle use.  Cardiovascular: Regular rate and rhythm, no murmurs / rubs / gallops. No extremity edema. 2+ pedal pulses.   Abdomen: obese, no tenderness, no masses palpated. No hepatosplenomegaly. Bowel sounds positive.  Musculoskeletal: no clubbing / cyanosis. No joint deformity upper and lower extremities. Good ROM, no contractures. Normal muscle tone.  Skin: Diffuse reddish petechial rash on bilateral lower extremities mostly from her knees down, a few on her bilateral upper extremities, lesions, ulcers. No induration Neurologic: CN 2-12 grossly intact. Strength 5/5 in all 4.  Psychiatric: Normal judgment and insight. Alert  and oriented x 3. Normal mood.   Labs on Admission: I have personally reviewed following labs and imaging studies  CBC: Recent Labs  Lab 04/01/19 1206 04/02/19 1855  WBC 10.5 11.4*  NEUTROABS 7.6*  --   HGB 15.2 14.9  HCT 47.3* 47.0*  MCV 96 98.7  PLT 239 260   Basic Metabolic Panel: Recent Labs  Lab 04/01/19 1206 04/02/19 1855  NA 131* 131*  K 4.7 4.5  CL 88* 91*  CO2 18* 20*  GLUCOSE 861* 930*  BUN 25 48*  CREATININE 1.15* 1.43*  CALCIUM 9.7 9.3   Liver Function Tests: Recent Labs  Lab 04/01/19 1206  AST 23  ALT 40*  ALKPHOS 80  BILITOT 0.6  PROT 7.8  ALBUMIN 4.4   HbA1C: Recent Labs  04/01/19 1206  HGBA1C 13.7*   CBG: Recent Labs  Lab 04/02/19 1825 04/02/19 2046  GLUCAP >600* >600*   Thyroid Function Tests: Recent Labs    04/01/19 1206  TSH 3.120   Urine analysis:    Component Value Date/Time   COLORURINE STRAW (A) 04/02/2019 1830   APPEARANCEUR CLEAR 04/02/2019 1830   APPEARANCEUR Clear 04/01/2019 1218   LABSPEC 1.025 04/02/2019 1830   PHURINE 5.0 04/02/2019 1830   GLUCOSEU >=500 (A) 04/02/2019 1830   HGBUR NEGATIVE 04/02/2019 1830   BILIRUBINUR NEGATIVE 04/02/2019 1830   BILIRUBINUR Negative 04/01/2019 1218   KETONESUR 20 (A) 04/02/2019 1830   PROTEINUR NEGATIVE 04/02/2019 1830   NITRITE NEGATIVE 04/02/2019 1830   LEUKOCYTESUR SMALL (A) 04/02/2019 1830    Radiological Exams on Admission: No results found.  EKG: None.  Assessment/Plan Active Problems:   DKA (diabetic ketoacidoses) (HCC)    DKA -glucose 930, anion gap 20, serum bicarb 20.  Due to noncompliance with medications-lipids well. Hgba1c yesterday 04/01/19- 13.7. 2 Liter bolus given in ED -Cont DKA protocol started in the ED - Cont IVF n/s 150 cc/hr  - IV KCL 10 meq X 2 - BMP Q4H X 3 -DM coordinator consult  Acute kidney injury-creatinine 1.43, baseline ~ 0.8.  Likely prerenal  2/2 dehydration from osmotic diuresis from glucosuria - hydrate - F/u BMP   PseudoHyponatremia- 131.  Corrected sodium for glucose is 144. - Hydrate, Insulin gtt  Rash-unknown etiology.  Lyme's test negative checked by PCP, recommended spotted fever pending -Continue doxycycline as prescribed by PCP.  Will hold prednisone at this time with hyperglycemia - F/u as outpatient  Hypertension-stable. -Continue home Norvasc, hold home HCTZ and benazepril with AKI and while hydrating.   DVT prophylaxis: Lovenox Code Status: Full Family Communication: None at bedside Disposition Plan: Per rounding team Consults called: None Admission status: Inpt, Step down I certify that at the point of admission it is my clinical judgment that the patient will require inpatient hospital care spanning beyond 2 midnights from the point of admission due to high intensity of service, high risk for further deterioration and high frequency of surveillance required. The following factors support the patient status of inpatient: DKA requiring insulin drip and ICU level of care for close monitoring.   Onnie Boer MD [facility]  04/02/2019, 10:07 PM

## 2019-04-03 DIAGNOSIS — I10 Essential (primary) hypertension: Secondary | ICD-10-CM

## 2019-04-03 DIAGNOSIS — E039 Hypothyroidism, unspecified: Secondary | ICD-10-CM

## 2019-04-03 DIAGNOSIS — E1169 Type 2 diabetes mellitus with other specified complication: Secondary | ICD-10-CM

## 2019-04-03 DIAGNOSIS — N179 Acute kidney failure, unspecified: Secondary | ICD-10-CM

## 2019-04-03 DIAGNOSIS — M797 Fibromyalgia: Secondary | ICD-10-CM

## 2019-04-03 DIAGNOSIS — Z8659 Personal history of other mental and behavioral disorders: Secondary | ICD-10-CM

## 2019-04-03 LAB — BASIC METABOLIC PANEL
Anion gap: 17 — ABNORMAL HIGH (ref 5–15)
Anion gap: 10 (ref 5–15)
Anion gap: 12 (ref 5–15)
BUN: 45 mg/dL — ABNORMAL HIGH (ref 6–20)
BUN: 47 mg/dL — ABNORMAL HIGH (ref 6–20)
BUN: 37 mg/dL — ABNORMAL HIGH (ref 6–20)
CO2: 22 mmol/L (ref 22–32)
CO2: 24 mmol/L (ref 22–32)
CO2: 23 mmol/L (ref 22–32)
Calcium: 9 mg/dL (ref 8.9–10.3)
Calcium: 8.8 mg/dL — ABNORMAL LOW (ref 8.9–10.3)
Calcium: 8.2 mg/dL — ABNORMAL LOW (ref 8.9–10.3)
Chloride: 99 mmol/L (ref 98–111)
Chloride: 109 mmol/L (ref 98–111)
Chloride: 102 mmol/L (ref 98–111)
Creatinine, Ser: 1.16 mg/dL — ABNORMAL HIGH (ref 0.44–1.00)
Creatinine, Ser: 0.99 mg/dL (ref 0.44–1.00)
Creatinine, Ser: 0.92 mg/dL (ref 0.44–1.00)
GFR calc Af Amer: 59 mL/min — ABNORMAL LOW (ref >60)
GFR calc Af Amer: >60 mL/min (ref >60)
GFR calc Af Amer: >60 mL/min (ref >60)
GFR calc non Af Amer: 51 mL/min — ABNORMAL LOW (ref >60)
GFR calc non Af Amer: >60 mL/min (ref >60)
GFR calc non Af Amer: >60 mL/min (ref >60)
Glucose, Bld: 457 mg/dL — ABNORMAL HIGH (ref 70–99)
Glucose, Bld: 244 mg/dL — ABNORMAL HIGH (ref 70–99)
Glucose, Bld: 383 mg/dL — ABNORMAL HIGH (ref 70–99)
Potassium: 3.3 mmol/L — ABNORMAL LOW (ref 3.5–5.1)
Potassium: 3.5 mmol/L (ref 3.5–5.1)
Potassium: 3.6 mmol/L (ref 3.5–5.1)
Sodium: 138 mmol/L (ref 135–145)
Sodium: 143 mmol/L (ref 135–145)
Sodium: 137 mmol/L (ref 135–145)

## 2019-04-03 LAB — GLUCOSE, CAPILLARY
Glucose-Capillary: 120 mg/dL — ABNORMAL HIGH (ref 70–99)
Glucose-Capillary: 138 mg/dL — ABNORMAL HIGH (ref 70–99)
Glucose-Capillary: 146 mg/dL — ABNORMAL HIGH (ref 70–99)
Glucose-Capillary: 172 mg/dL — ABNORMAL HIGH (ref 70–99)
Glucose-Capillary: 189 mg/dL — ABNORMAL HIGH (ref 70–99)
Glucose-Capillary: 207 mg/dL — ABNORMAL HIGH (ref 70–99)
Glucose-Capillary: 215 mg/dL — ABNORMAL HIGH (ref 70–99)
Glucose-Capillary: 215 mg/dL — ABNORMAL HIGH (ref 70–99)
Glucose-Capillary: 239 mg/dL — ABNORMAL HIGH (ref 70–99)
Glucose-Capillary: 259 mg/dL — ABNORMAL HIGH (ref 70–99)
Glucose-Capillary: 269 mg/dL — ABNORMAL HIGH (ref 70–99)
Glucose-Capillary: 324 mg/dL — ABNORMAL HIGH (ref 70–99)
Glucose-Capillary: 325 mg/dL — ABNORMAL HIGH (ref 70–99)
Glucose-Capillary: 326 mg/dL — ABNORMAL HIGH (ref 70–99)
Glucose-Capillary: 328 mg/dL — ABNORMAL HIGH (ref 70–99)
Glucose-Capillary: 337 mg/dL — ABNORMAL HIGH (ref 70–99)
Glucose-Capillary: 348 mg/dL — ABNORMAL HIGH (ref 70–99)
Glucose-Capillary: 364 mg/dL — ABNORMAL HIGH (ref 70–99)
Glucose-Capillary: 373 mg/dL — ABNORMAL HIGH (ref 70–99)
Glucose-Capillary: 412 mg/dL — ABNORMAL HIGH (ref 70–99)
Glucose-Capillary: 452 mg/dL — ABNORMAL HIGH (ref 70–99)
Glucose-Capillary: 461 mg/dL — ABNORMAL HIGH (ref 70–99)

## 2019-04-03 LAB — MRSA PCR SCREENING: MRSA by PCR: NEGATIVE

## 2019-04-03 MED ORDER — POTASSIUM CHLORIDE CRYS ER 20 MEQ PO TBCR
40.0000 meq | EXTENDED_RELEASE_TABLET | Freq: Once | ORAL | Status: AC
  Administered 2019-04-03: 40 meq via ORAL
  Filled 2019-04-03: qty 2

## 2019-04-03 MED ORDER — LIVING WELL WITH DIABETES BOOK
Freq: Once | Status: AC
  Administered 2019-04-03: 12:00:00

## 2019-04-03 MED ORDER — CHLORHEXIDINE GLUCONATE CLOTH 2 % EX PADS
6.0000 | MEDICATED_PAD | Freq: Every day | CUTANEOUS | Status: DC
  Administered 2019-04-03 – 2019-04-04 (×2): 6 via TOPICAL

## 2019-04-03 MED ORDER — INSULIN ASPART 100 UNIT/ML ~~LOC~~ SOLN
0.0000 [IU] | Freq: Three times a day (TID) | SUBCUTANEOUS | Status: DC
  Administered 2019-04-03: 17:00:00 7 [IU] via SUBCUTANEOUS

## 2019-04-03 MED ORDER — INSULIN ASPART 100 UNIT/ML ~~LOC~~ SOLN
10.0000 [IU] | Freq: Three times a day (TID) | SUBCUTANEOUS | Status: DC
  Administered 2019-04-03 – 2019-04-05 (×6): 10 [IU] via SUBCUTANEOUS

## 2019-04-03 MED ORDER — INSULIN STARTER KIT- PEN NEEDLES (ENGLISH)
1.0000 | Freq: Once | Status: DC
  Filled 2019-04-03: qty 1

## 2019-04-03 MED ORDER — INSULIN DETEMIR 100 UNIT/ML ~~LOC~~ SOLN
20.0000 [IU] | Freq: Two times a day (BID) | SUBCUTANEOUS | Status: DC
  Administered 2019-04-03 (×2): 20 [IU] via SUBCUTANEOUS
  Filled 2019-04-03 (×3): qty 0.2

## 2019-04-03 MED ORDER — SODIUM CHLORIDE 0.9 % IV SOLN
INTRAVENOUS | Status: DC | PRN
  Administered 2019-04-03: 1000 mL via INTRAVENOUS

## 2019-04-03 MED ORDER — INSULIN ASPART 100 UNIT/ML ~~LOC~~ SOLN
12.0000 [IU] | Freq: Once | SUBCUTANEOUS | Status: AC
  Administered 2019-04-03: 15:00:00 12 [IU] via SUBCUTANEOUS

## 2019-04-03 MED ORDER — ALPRAZOLAM 0.5 MG PO TABS: 0.5000 mg | ORAL_TABLET | Freq: Two times a day (BID) | ORAL | Status: DC | PRN

## 2019-04-03 NOTE — Progress Notes (Signed)
Inpatient Diabetes Program Recommendations  AACE/ADA: New Consensus Statement on Inpatient Glycemic Control (2015)  Target Ranges:  Prepandial:   less than 140 mg/dL      Peak postprandial:   less than 180 mg/dL (1-2 hours)      Critically ill patients:  140 - 180 mg/dL   Lab Results  Component Value Date   GLUCAP 373 (H) 04/03/2019   HGBA1C 13.7 (H) 04/01/2019    Review of Glycemic Control Results for [name], [name] (MRN [id number]) as of 04/03/2019 15:04  Ref. Range 04/03/2019 11:00 04/03/2019 11:57 04/03/2019 14:06  Glucose-Capillary Latest Ref Range: 70 - 99 mg/dL 256 (H) 389 (H) 373 (H)   Diabetes history: Type 2 DM Outpatient Diabetes medications: Glipizide 10 mg QAM (not taking) Current orders for Inpatient glycemic control: Levemir 20 units BID, Novolog 12 units x 1, Novolog 0-9 units TID  Inpatient Diabetes Program Recommendations:    Noted patient's increased trends following transition off of glucostabilizer, thus the Novolog 12 units x 1.   Of note, patient received 102 units is a 6 hour period while on glucostabilizer.   At this time, recommend adding Novolog 10 units TID (assuming patient is consuming >50% of meal) and increasing correction to Novolog 0-15 units TID. Secure chat sent to Dr Ella Jubilee.   Spoke with patient regarding outpatient management of diabetes. Patient was not taking Glipzide and "fell off the wagon" with her diet. Reviewed patient's current A1c of 13.7%. Explained what a A1c is and what it measures. Also, reviewed goal A1c with patient, importance of good glucose control @ home, and blood sugar goals. Reviewed patho of DM, need for insulin, role of pancreas, DKA, 70/30, survival skills to include hypoglycemia, carbohydrate counting, vascular changes and comorbidites. Patient will need a meter at discharge. Attached list of [employer] products, so patient can purchase. Sent patient information on list for purchasing items. Patient states son is able to get  items for her. Encouraged to begin checking CBGs 3 times per day and reporting glucose to MD when consistently above 200-250 mg/dL. Patient has a PCP and a follow up appointment. Discussed need for insulin at discharge, patient prefers insulin pens. Recommend Novolin 70/30 at discharge. Reviewed to be given with meals. Reviewed principles of insulin pen use and sent patient videos to watch. Encouraged RN to begin working with patient towards self-injections. Attached information into DC summary.  Additionally, consult place for dietitian, [facility], and [facility]DM book. Patient has no further questions at this time.  Thanks, Lujean Rave, MSN, RNC-OB Diabetes Coordinator [phone] (8a-5p)

## 2019-04-03 NOTE — Plan of Care (Signed)
Nutrition Education Note  RD consulted for nutrition education regarding  diabetes.   Lab Results  Component Value Date   HGBA1C 13.7 (H) 04/01/2019   RD operating remotely d/t covid precautions. Was able to reach patient via phone.   Diet recall Breakfast: Eggs on toast most times, drinks milk. +/- OJ, +/- gravy Lunch: "Something quick", mostly sandwiches. Current living with son who grills items for lunch or occasionally gets takeout Dinner: Whatever son makes. They regularly have chicken and always include a vegetable at dinner. She did mention spaghetti once in a while.  Beverages: MIlk, water flavored w/ V8 splash, occasional semi-sweet tea Behaviors: Reads labels and looks at carbs. Admits to having potatoes with just about every meal. Does NOT skip meals. Rarely gets eats out.   Based on the ps report, her diet is really not too poor. Elevated a1c likely more related to noncompliance with oral meds more so than diet. Patient seems to be well aware of where she can improve. Her worsening BG control does not sound to be from lack of understanding; she is upfront about just "falling off the wagon" and not doing what she is supposed to. When she was trying, she had  managed to get her a1c down to 6.6 a couple years ago.   The biggest thing the patient should change is her consumption of potatoes at every meal. Pt is aware this is an issue and says this is the first thing she plans to changed. The other vegetables the patient consumes are non starchy.   RD recommended against any sugar sweetened beverages. The V8 splash has ~30g sugar per serving. Would prefer patient use zero-sugar sweeteners/flavorings. RD listed some commercial examples. She says she does not have a problem with just drinking water.   Discussed the benefits of weight loss in increasing insulin sensitivity. She says she plans to start walking again once she recovers from this illness. RD noted in chart she already appears  to have lost 30-40 lbs since end of last year, though this may have been related to her uncontrolled DM.  RD reinforced her good behaviors- not skipping meals, not eating out, not drinking sodas, including a vegetable with her lunch/dinner and reading labels.   RD will leave a summary of recommendations in D/C instructions.   Expect Good compliance. Patient seems to have a good understanding of a DM diet. She had just gotten away from doing what she knows she needs to. She says this episode was a "wake up" for her and she is gonna make some healthy changes to get her BG back under control.   Body mass index is 44.58 kg/m. Pt meets criteria for morbidly obese based on current BMI.  No further nutrition interventions warranted at this time. RD contact information provided. If additional nutrition issues arise, please re-consult RD.  Christophe Louis RD, LDN, CNSC Clinical Nutrition Available Tues-Sat via Pager: [phone] 04/03/2019 11:43 AM

## 2019-04-03 NOTE — Progress Notes (Signed)
Present with Cynthia Espinoza for spiritual support as she asked for prayer. We discussed her faith and health as relates to this hospitalization. She shared about good family and faith support. She also shared her struggle to take care of herself as far as what she can do--eating better, getting exercise. We affirmed her ability to choose what is best and prayed along those lines, in part.

## 2019-04-03 NOTE — Progress Notes (Signed)
PROGRESS NOTE    Loma BostonSylvia C Espinoza  ZOX:[id number] DOB: October 10, 1956 DOA: 04/02/2019 PCP: Remus LofflerJones, Angel S, PA-C    Brief Narrative:  63 year old female who presented with hyperglycemia.  She does have significant past medical history for hypertension, type 2 diabetes mellitus, depression and PTSD.  She was referred to the hospital by her primary care physician after finding very elevated glucose.  Patient has been off antihyperglycemic agents for the last 8 months.  For the last 7 days she has been developing worsening polyuria, polydipsia, easy fatigability, nausea and vomiting.  The day before admission she was prescribed doxycycline and prednisone for a generalized rash.  Positive tick bite about 4 weeks ago.  On her initial physical examination her blood pressure was 110/54, heart rate 88, respiratory rate 19, temperature 97.9, oxygen saturation 94%.  Dry mucous membranes, her lungs are clear to auscultation bilaterally, heart S1-S2 present with me, abdomen protuberant and nontender.  She had a generalized papular macular rash. Sodium 131, potassium 4.5, chloride 91, bicarb 20, glucose 930, BUN 48, creatinine 1.43, anion gap 20, white count 11.4, hemoglobin 14.9, hematocrit 47.0, platelets 260.  SARS COVID-19 negative.  Urinalysis more than 500 glucose.  Patient was admitted to the hospital with a working diagnosis of diabetes ketoacidosis.   Assessment & Plan:   Principal Problem:   DKA (diabetic ketoacidoses) (HCC) Active Problems:   Essential hypertension   Hypothyroidism   Fibromyalgia   Morbid obesity (HCC)   History of posttraumatic stress disorder (PTSD)   Type 2 diabetes mellitus with other specified complication (HCC)   AKI (acute kidney injury) (HCC)  1. DKA. Patient has been tolerating water po with ice chips, this am her anion gap has been closed, to 10. Glucose 244. High insulin requirements, over last 8 H, up to 68 units. Her weight is 129 kg. Patient with insulin resistance.  Will transition to sq insulin at 0.5 units per Kg, 64 units per day, 70% of that will be 45 units. Will place patient on 20 units of basal insulin bid. Continue glucose cover and monitoring with insulin sliding scale, continue to calculate insulin requirements. Advance diet as tolerated. Correct K with oral Kcl. Follow bmp at 16:00.   2. AKI with hypokalemia. Renal function with serum cr down to 0.99, with K at 3,5, Will continue K correction with Kcl, hold on IV fluids for now, follow bmp at 16:00.   3. T2DM. Poorly controlled diabetes at home, will continue insulin therapy at discharge. Hgb A1c 13.7 on 03/2017.   4. HTN. Blood pressure systolic 117 to 135, will continue blood pressure control with amlodipine.   5. Anxiety. Will resume alprazolam, as needed for anxiety.  6. Hypothyroid. Continue levothyroxine.   7. Morbid obesity. BMI is 44,5. Will need close follow up as outpatient.   8. Resolving papular rash. Possible allergic reaction, will discontinue antibiotic therapy. Hold on steroids for now.   DVT prophylaxis: enoxaparin   Code Status:  full Family Communication: no family at the bedside Disposition Plan/ discharge barriers: step down while on insulin drip.   Body mass index is 44.58 kg/m. Malnutrition Type:      Malnutrition Characteristics:      Nutrition Interventions:     RN Pressure Injury Documentation:     Consultants:     Procedures:     Antimicrobials:       Subjective: Patient is feeling better, but not back to baseline, nausea has improved, no chest pain, no dyspnea. Pruriginous rash,  generalized.   Objective: Vitals:   04/03/19 0400 04/03/19 0500 04/03/19 0600 04/03/19 0700  BP: 112/73 113/74 108/65 (!) 135/106  Pulse: 73 68 70 71  Resp: Temp: 97.8 F (36.6 C)     TempSrc:      SpO2: (!) 89% 95% 92% 98%  Weight:      Height:        Intake/Output Summary (Last 24 hours) at 04/03/2019 0727 Last data filed at  04/03/2019 0716 Gross per 24 hour  Intake 3065.91 ml  Output -  Net 3065.91 ml   Filed Weights   04/02/19 1827 04/02/19 2347  Weight: 136.1 kg 129.1 kg    Examination:   General: deconditioned  Neurology: Awake and alert, non focal  E ENT: mild pallor, no icterus, oral mucosa moist Cardiovascular: No JVD. S1-S2 present, rhythmic, no gallops, rubs, or murmurs. No lower extremity edema. Pulmonary: positive breath sounds bilaterally, adequate air movement, no wheezing, rhonchi or rales. Gastrointestinal. Abdomen protuberant, no organomegaly, non tender, no rebound or guarding Skin. Generalized maculo-papular rash, sings of pruritus.  Musculoskeletal: no joint deformities     Data Reviewed: I have personally reviewed following labs and imaging studies  CBC: Recent Labs  Lab 04/01/19 1206 04/02/19 1855  WBC 10.5 11.4*  NEUTROABS 7.6*  --   HGB 15.2 14.9  HCT 47.3* 47.0*  MCV 96 98.7  PLT 239 260   Basic Metabolic Panel: Recent Labs  Lab 04/01/19 1206 04/02/19 1855 04/03/19 0037 04/03/19 0416  NA 131* 131* 138 143  K 4.7 4.5 3.3* 3.5  CL 88* 91* 99 109  CO2 18* 20* 22 24  GLUCOSE 861* 930* 457* 244*  BUN 25 48* 45* 47*  CREATININE 1.15* 1.43* 1.16* 0.99  CALCIUM 9.7 9.3 9.0 8.8*   GFR: Estimated Creatinine Clearance: 84.5 mL/min (by C-G formula based on SCr of 0.99 mg/dL). Liver Function Tests: Recent Labs  Lab 04/01/19 1206  AST 23  ALT 40*  ALKPHOS 80  BILITOT 0.6  PROT 7.8  ALBUMIN 4.4   No results for input(s): LIPASE, AMYLASE in the last 168 hours. No results for input(s): AMMONIA in the last 168 hours. Coagulation Profile: No results for input(s): INR, PROTIME in the last 168 hours. Cardiac Enzymes: No results for input(s): CKTOTAL, CKMB, CKMBINDEX, TROPONINI in the last 168 hours. BNP (last 3 results) No results for input(s): PROBNP in the last 8760 hours. HbA1C: Recent Labs    04/01/19 1206  HGBA1C 13.7*   CBG: Recent Labs  Lab  04/03/19 0307 04/03/19 0406 04/03/19 0511 04/03/19 0607 04/03/19 0708  GLUCAP 259* 239* 215* 189* 172*   Lipid Profile: No results for input(s): CHOL, HDL, LDLCALC, TRIG, CHOLHDL, LDLDIRECT in the last 72 hours. Thyroid Function Tests: Recent Labs    04/01/19 1206  TSH 3.120   Anemia Panel: No results for input(s): VITAMINB12, FOLATE, FERRITIN, TIBC, IRON, RETICCTPCT in the last 72 hours.    Radiology Studies: I have reviewed all of the imaging during this hospital visit personally     Scheduled Meds: . amLODipine  10 mg Oral Daily  . Chlorhexidine Gluconate Cloth  6 each Topical Q0600  . doxycycline  100 mg Oral BID  . enoxaparin (LOVENOX) injection  0.5 mg/kg Subcutaneous Q24H  . levothyroxine  200 mcg Oral QAC breakfast  . living well with diabetes book   Does not apply Once   Continuous Infusions: . sodium chloride Stopped (04/03/19 0413)  . sodium chloride 1,000  mL (04/03/19 0027)  . dextrose 5 % and 0.45% NaCl    . dextrose 5 % and 0.45% NaCl 100 mL/hr at 04/03/19 0716  . insulin 9 mL/hr at 04/03/19 0716     LOS: 1 day        Mauricio Annett Gula, MD

## 2019-04-03 NOTE — Discharge Instructions (Signed)
[facility]         Hemoglobin A1c Test Why am I having this test? You may have the hemoglobin A1c test (HbA1c test) done to:  Evaluate your risk for developing diabetes (diabetes mellitus).  Diagnose diabetes.  Monitor long-term control of blood sugar (glucose) in people who have diabetes and help make treatment decisions. This test may be done with other blood glucose tests, such as fasting blood glucose and oral glucose tolerance tests. What is being tested? Hemoglobin is a type of protein in the blood that carries oxygen. Glucose attaches to hemoglobin to form glycated hemoglobin. This test checks the amount of glycated hemoglobin in your blood, which is a good indicator of the average amount of glucose in your blood during the past 2-3 months. What kind of sample is taken?  A blood sample is required for this test. It is usually collected by inserting a needle into a blood vessel. Tell a health care provider about:  All medicines you are taking, including vitamins, herbs, eye drops, creams, and over-the-counter medicines.  Any blood disorders you have.  Any surgeries you have had.  Any medical conditions you have.  Whether you are pregnant or may be pregnant. How are the results reported? Your results will be reported as a percentage that indicates how much of your hemoglobin has glucose attached to it (is glycated). Your health care provider will compare your results to normal ranges that were established after testing a large group of people (reference ranges). Reference ranges may vary among labs and hospitals. For this test, common reference ranges are:  Adult or child without diabetes: 4-5.6%.  Adult or child with diabetes and good blood glucose control: less than 7%. What do the results mean? If you have diabetes:  A result of less than 7% is considered normal, meaning that your blood glucose is well controlled.  A result higher than 7% means that  your blood glucose is not well controlled, and your treatment plan may need to be adjusted. If you do not have diabetes:  A result within the reference range is considered normal, meaning that you are not at high risk for diabetes.  A result of 5.7-6.4% means that you have a high risk of developing diabetes, and you may have prediabetes. Prediabetes is the condition of having a blood glucose level that is higher than it should be, but not high enough for you to be diagnosed with diabetes. Having prediabetes puts you at risk for developing type 2 diabetes (type 2 diabetes mellitus). You may have more tests, including a repeat HbA1c test.  Results of 6.5% or higher on two separate HbA1c tests mean that you have diabetes. You may have more tests to confirm the diagnosis. Abnormally low HbA1c values may be caused by:  Pregnancy.  Severe blood loss.  Receiving donated blood (transfusions).  Low red blood cell count (anemia).  Long-term kidney failure.  Some unusual forms (variants) of hemoglobin. Talk with your health care provider about what your results mean. Questions to ask your health care provider Ask your health care provider, or the department that is doing the test:  When will my results be ready?  How will I get my results?  What are my treatment options?  What other tests do I need?  What are my next steps? Summary  The hemoglobin A1c test (HbA1c test) may be done to evaluate your risk for developing diabetes, to diagnose diabetes, and to monitor long-term control of  blood sugar (glucose) in people who have diabetes and help make treatment decisions.  Hemoglobin is a type of protein in the blood that carries oxygen. Glucose attaches to hemoglobin to form glycated hemoglobin. This test checks the amount of glycated hemoglobin in your blood, which is a good indicator of the average amount of glucose in your blood during the past 2-3 months.  Talk with your health care  provider about what your results mean. This information is not intended to replace advice given to you by your health care provider. Make sure you discuss any questions you have with your health care provider. Document Released: 11/22/2004 Document Revised: 06/13/2017 Document Reviewed: 06/13/2017 Elsevier Interactive Patient Education  2019 [place]. Insulin Injection Instructions, Using Insulin Pens, Adult A subcutaneous injection is a shot of medicine that is injected into the layer of fat and tissue between skin and muscle. People with type 1 diabetes must take insulin because their bodies do not make it. People with type 2 diabetes may need to take insulin. There are many different types of insulin. The type of insulin that you take may determine how many injections you give yourself and when you need to give the injections. Supplies needed:  Soap and water to wash hands.  Your insulin pen.  A new, unused needle.  Alcohol wipes.  A disposal container that is meant for sharp items (sharps container), such as an empty plastic bottle with a cover. How to choose a site for injection The body absorbs insulin differently, depending on where the insulin is injected (injection site). It is best to inject insulin into the same body area each time (for example, always in the abdomen), but you should use a different spot in that area for each injection. Do not inject the insulin in the same spot each time. There are five main areas that can be used for injecting. These areas include:  Abdomen. This is the preferred area.  Front of thigh.  Upper, outer side of thigh.  Upper, outer side of arm.  Upper, outer part of buttock. How to use an insulin pen  First, follow the steps for Get ready, then continue with the steps for Inject the insulin. Get ready 1. Wash your hands with soap and water. If soap and water are not available, use hand sanitizer. 2. Before you give yourself an insulin  injection, be sure to test your blood sugar level (blood glucose level) and write down that number. Follow any instructions from your health care provider about what to do if your blood glucose level is higher or lower than your normal range. 3. Check the expiration date and the type of insulin that is in the pen. 4. If you are using CLEAR insulin, check to see that it is clear and free of clumps. 5. If you are using CLOUDY insulin, do not shake the pen to get the injection ready. Instead, get it ready in one of these ways: ? Gently roll the pen between your palms several times. ? Tip the pen up and down several times. 6. Remove the cap from the insulin pen. 7. Use an alcohol wipe to clean the rubber tip of the pen. 8. Remove the protective paper tab from the disposable needle. Do not let the needle touch anything. 9. Screw a new, unused needle onto the pen. 10. Remove the outer plastic needle cover. Do not throw away the outer plastic cover yet. ? If the pen uses a special safety needle, leave  the inner needle shield in place. ? If the pen does not use a special safety needle, remove the inner plastic cover from the needle. 11. Follow the manufacturer's instructions to prime the insulin pen with the volume of insulin needed. Hold the pen with the needle pointing up, and push the button on the opposite end of the pen until a drop of insulin appears at the needle tip. If no insulin appears, repeat this step. 12. Turn the button (dial) to the number of units of insulin that you will be injecting. Inject the insulin 1. Use an alcohol wipe to clean the site where you will be injecting the needle. Let the site air-dry. 2. Hold the pen in the palm of your writing hand like a pencil. 3. If directed by your health care provider, use your other hand to pinch and hold about an inch (2.5 cm) of skin at the injection site. Do not directly touch the cleaned part of the skin. 4. Gently but quickly, use your  writing hand to put the needle straight into the skin. The needle should be at a 90-degree angle (perpendicular) to the skin. 5. When the needle is completely inserted into the skin, use your thumb or index finger of your writing hand to push the top button of the pen down all the way to inject the insulin. 6. Let go of the skin that you are pinching. Continue to hold the pen in place with your writing hand. 7. Wait 10 seconds, then pull the needle straight out of the skin. This will allow all of the insulin to go from the pen and needle into your body. 8. Carefully put the larger (outer) plastic cover of the needle back over the needle, then unscrew the capped needle and discard it in a sharps container, such as an empty plastic bottle with a cover. 9. Put the plastic cap back on the insulin pen. How to throw away supplies  Discard all used needles in a puncture-proof sharps disposal container. You can ask your local pharmacy about where you can get this kind of disposal container, or you can use an empty plastic liquid laundry detergent bottle that has a cover.  Follow the disposal regulations for the area where you live. Do not use any needle more than one time.  Throw away empty disposable pens in the regular trash. Questions to ask your health care provider  How often should I be taking insulin?  How often should I check my blood glucose?  What amount of insulin should I be taking at each time?  What are the side effects?  What should I do if my blood glucose is too high?  What should I do if my blood glucose is too low?  What should I do if I forget to take my insulin?  What number should I call if I have questions? Where to find more information  American Diabetes Association (ADA): www.diabetes.org  American Association of Diabetes Educators (AADE) Patient Resources: https://www.diabeteseducator.org Summary  A subcutaneous injection is a shot of medicine that is injected  into the layer of fat and tissue between skin and muscle.  Before you give yourself an insulin injection, be sure to test your blood sugar level (blood glucose level) and write down that number.  Check the expiration date and the type of insulin that is in the pen. The type of insulin that you take may determine how many injections you give yourself and when you need to  give the injections.  It is best to inject insulin into the same body area each time (for example, always in the abdomen), but you should use a different spot in that area for each injection. This information is not intended to replace advice given to you by your health care provider. Make sure you discuss any questions you have with your health care provider. Document Released: 12/04/2015 Document Revised: 11/20/2017 Document Reviewed: 12/04/2015 Elsevier Interactive Patient Education  2019 [employer] Diabetic Ketoacidosis Diabetic ketoacidosis is a serious complication of diabetes. This condition develops when there is not enough insulin in the body. Insulin is an hormone that regulates blood sugar levels in the body. Normally, insulin allows glucose to enter the cells in the body. The cells break down glucose for energy. Without enough insulin, the body cannot break down glucose, so it breaks down fats instead. This leads to high blood glucose levels in the body and the production of acids that are called ketones. Ketones are poisonous at high levels. If diabetic ketoacidosis is not treated, it can cause severe dehydration and can lead to a coma or death. What are the causes? This condition develops when a lack of insulin causes the body to break down fats instead of glucose. This may be triggered by:  Stress on the body. This stress is brought on by an illness.  Infection.  Medicines that raise blood glucose levels.  Not taking diabetes medicine.  New onset of type 1 diabetes mellitus. What are the signs or  symptoms? Symptoms of this condition include:  Fatigue.  Weight loss.  Excessive thirst.  Light-headedness.  Fruity or sweet-smelling breath.  Excessive urination.  Vision changes.  Confusion or irritability.  Nausea.  Vomiting.  Rapid breathing.  Abdominal pain.  Feeling flushed. How is this diagnosed? This condition is diagnosed based on your medical history, a physical exam, and blood tests. You may also have a urine test to check for ketones. How is this treated? This condition may be treated with:  Fluid replacement. This may be done to correct dehydration.  Insulin injections. These may be given through the skin or through an IV tube.  Electrolyte replacement. Electrolytes are minerals in your blood. Electrolytes such as potassium and sodium may be given in pill form or through an IV tube.  Antibiotic medicines. These may be prescribed if your condition was caused by an infection. Diabetic ketoacidosis is a serious medical condition. You may need emergency treatment in the hospital to monitor your condition. Follow these instructions at home: Eating and drinking  Drink enough fluids to keep your urine clear or pale yellow.  If you are not able to eat, drink clear fluids in small amounts as you are able. Clear fluids include water, ice chips, fruit juice with water added (diluted), and low-calorie sports drinks. You may also have sugar-free jello or popsicles.  If you are able to eat, follow your usual diet and drink sugar-free liquids, such as water. Medicines  Take over-the-counter and prescription medicines only as told by your health care provider.  Continue to take insulin and other diabetes medicines as told by your health care provider.  If you were prescribed an antibiotic, take it as told by your health care provider. Do not stop taking the antibiotic even if you start to feel better. General instructions   Check your urine for ketones when you  are ill and as told by your health care provider. ? If your blood glucose is 240 mg/dL (  13.3 mmol/L) or higher, check your urine ketones every 4-6 hours.  Check your blood glucose every day, as often as told by your health care provider. ? If your blood glucose is high, drink plenty of fluids. This helps to flush out ketones. ? If your blood glucose is above your target for 2 tests in a row, contact your health care provider.  Carry a medical alert card or wear medical alert jewelry that says that you have diabetes.  Rest and exercise only as told by your health care provider. Do not exercise when your blood glucose is high and you have ketones in your urine.  If you get sick, call your health care provider and begin treatment quickly. Your body often needs extra insulin to fight an illness. Check your blood glucose every 4-6 hours when you are sick.  Keep all follow-up visits as told by your health care provider. This is important. Contact a health care provider if:  Your blood glucose level is higher than 240 mg/dL (13.3 mmol/L) for 2 days in a row.  You have moderate or large ketones in your urine.  You have a fever.  You cannot eat or drink without vomiting.  You have been vomiting for more than 2 hours.  You continue to have symptoms of diabetic ketoacidosis.  You develop new symptoms. Get help right away if:  Your blood glucose monitor reads high even when you are taking insulin.  You faint.  You have chest pain.  You have trouble breathing.  You have sudden trouble speaking or swallowing.  You have vomiting or diarrhea that gets worse after 3 hours.  You are unable to stay awake.  You have trouble thinking.  You are severely dehydrated. Symptoms of severe dehydration include: ? Extreme thirst. ? Dry mouth. ? Rapid breathing. These symptoms may represent a serious problem that is an emergency. Do not wait to see if the symptoms will go away. Get medical help  right away. Call your local emergency services (911 in the [place] Do not drive yourself to the hospital. Summary  Diabetic ketoacidosis is a serious complication of diabetes. This condition develops when there is not enough insulin in the body.  This condition is diagnosed based on your medical history, a physical exam, and blood tests. You may also have a urine test to check for ketones.  Diabetic ketoacidosis is a serious medical condition. You may need emergency treatment in the hospital to monitor your condition.  Contact your health care provider if your blood glucose is higher than 240 mg/dl for 2 days in a row or if you have moderate or large ketones in your urine. This information is not intended to replace advice given to you by your health care provider. Make sure you discuss any questions you have with your health care provider. Document Released: 10/28/2000 Document Revised: 12/05/2016 Document Reviewed: 12/05/2016 Elsevier Interactive Patient Education  2019 [place]. Hypoglycemia Hypoglycemia occurs when the level of sugar (glucose) in the blood is too low. Hypoglycemia can happen in people who do or do not have diabetes. It can develop quickly, and it can be a medical emergency. For most people with diabetes, a blood glucose level below 70 mg/dL (3.9 mmol/L) is considered hypoglycemia. Glucose is a type of sugar that provides the body's main source of energy. Certain hormones (insulin and glucagon) control the level of glucose in the blood. Insulin lowers blood glucose, and glucagon raises blood glucose. Hypoglycemia can result from having too  much insulin in the bloodstream, or from not eating enough food that contains glucose. You may also have reactive hypoglycemia, which happens within 4 hours after eating a meal. What are the causes? Hypoglycemia occurs most often in people who have diabetes and may be caused by:  Diabetes medicine.  Not eating enough, or not eating  often enough.  Increased physical activity.  Drinking alcohol on an empty stomach. If you do not have diabetes, hypoglycemia may be caused by:  A tumor in the pancreas.  Not eating enough, or not eating for long periods at a time (fasting).  A severe infection or illness.  Certain medicines. What increases the risk? Hypoglycemia is more likely to develop in:  People who have diabetes and take medicines to lower blood glucose.  People who abuse alcohol.  People who have a severe illness. What are the signs or symptoms? Mild symptoms Mild hypoglycemia may not cause any symptoms. If you do have symptoms, they may include:  Hunger.  Anxiety.  Sweating and feeling clammy.  Dizziness or feeling light-headed.  Sleepiness.  Nausea.  Increased heart rate.  Headache.  Blurry vision.  Irritability.  Tingling or numbness around the mouth, lips, or tongue.  A change in coordination.  Restless sleep. Moderate symptoms Moderate hypoglycemia can cause:  Mental confusion and poor judgment.  Behavior changes.  Weakness.  Irregular heartbeat. Severe symptoms Severe hypoglycemia is a medical emergency. It can cause:  Fainting.  Seizures.  Loss of consciousness (coma).  Death. How is this diagnosed? Hypoglycemia is diagnosed with a blood test to measure your blood glucose level. This blood test is done while you are having symptoms. Your health care provider may also do a physical exam and review your medical history. How is this treated? This condition can often be treated by immediately eating or drinking something that contains sugar, such as:  Fruit juice, 4-6 oz (120-150 mL).  Regular soda (not diet soda), 4-6 oz (120-150 mL).  Low-fat milk, 4 oz (120 mL).  Several pieces of hard candy.  Sugar or honey, 1 Tbsp (15 mL). Treating hypoglycemia if you have diabetes If you are alert and able to swallow safely, follow the 15:15 rule:  Take 15 grams of  a rapid-acting carbohydrate. Talk with your health care provider about how much you should take.  Rapid-acting options include: ? Glucose pills (take 15 grams). ? 6-8 pieces of hard candy. ? 4-6 oz (120-150 mL) of fruit juice. ? 4-6 oz (120-150 mL) of regular (not diet) soda. ? 1 Tbsp (15 mL) honey or sugar.  Check your blood glucose 15 minutes after you take the carbohydrate.  If the repeat blood glucose level is still at or below 70 mg/dL (3.9 mmol/L), take 15 grams of a carbohydrate again.  If your blood glucose level does not increase above 70 mg/dL (3.9 mmol/L) after 3 tries, seek emergency medical care.  After your blood glucose level returns to normal, eat a meal or a snack within 1 hour.  Treating severe hypoglycemia Severe hypoglycemia is when your blood glucose level is at or below 54 mg/dL (3 mmol/L). Severe hypoglycemia is a medical emergency. Get medical help right away. If you have severe hypoglycemia and you cannot eat or drink, you may need an injection of glucagon. A family member or close friend should learn how to check your blood glucose and how to give you a glucagon injection. Ask your health care provider if you need to have an emergency glucagon injection  kit available. Severe hypoglycemia may need to be treated in a hospital. The treatment may include getting glucose through an IV. You may also need treatment for the cause of your hypoglycemia. Follow these instructions at home:  General instructions  Take over-the-counter and prescription medicines only as told by your health care provider.  Monitor your blood glucose as told by your health care provider.  Limit alcohol intake to no more than 1 drink a day for nonpregnant women and 2 drinks a day for men. One drink equals 12 oz of beer (355 mL), 5 oz of wine (148 mL), or 1 oz of hard liquor (44 mL).  Keep all follow-up visits as told by your health care provider. This is important. If you have  diabetes:  Always have a rapid-acting carbohydrate snack with you to treat low blood glucose.  Follow your diabetes management plan as directed. Make sure you: ? Know the symptoms of hypoglycemia. It is important to treat it right away to prevent it from becoming severe. ? Take your medicines as directed. ? Follow your exercise plan. ? Follow your meal plan. Eat on time, and do not skip meals. ? Check your blood glucose as often as directed. Always check before and after exercise. ? Follow your sick day plan whenever you cannot eat or drink normally. Make this plan in advance with your health care provider.  Share your diabetes management plan with people in your workplace, school, and household.  Check your urine for ketones when you are ill and as told by your health care provider.  Carry a medical alert card or wear medical alert jewelry. Contact a health care provider if:  You have problems keeping your blood glucose in your target range.  You have frequent episodes of hypoglycemia. Get help right away if:  You continue to have hypoglycemia symptoms after eating or drinking something containing glucose.  Your blood glucose is at or below 54 mg/dL (3 mmol/L).  You have a seizure.  You faint. These symptoms may represent a serious problem that is an emergency. Do not wait to see if the symptoms will go away. Get medical help right away. Call your local emergency services (911 in the [place] Summary  Hypoglycemia occurs when the level of sugar (glucose) in the blood is too low.  Hypoglycemia can happen in people who do or do not have diabetes. It can develop quickly, and it can be a medical emergency.  Make sure you know the symptoms of hypoglycemia and how to treat it.  Always have a rapid-acting carbohydrate snack with you to treat low blood sugar. This information is not intended to replace advice given to you by your health care provider. Make sure you discuss any  questions you have with your health care provider. Document Released: 10/31/2005 Document Revised: 04/24/2018 Document Reviewed: 12/04/2015 Elsevier Interactive Patient Education  2019 [place]. Hyperglycemia Hyperglycemia occurs when the level of sugar (glucose) in the blood is too high. Glucose is a type of sugar that provides the body's main source of energy. Certain hormones (insulin and glucagon) control the level of glucose in the blood. Insulin lowers blood glucose, and glucagon increases blood glucose. Hyperglycemia can result from having too little insulin in the bloodstream, or from the body not responding normally to insulin. Hyperglycemia occurs most often in people who have diabetes (diabetes mellitus), but it can happen in people who do not have diabetes. It can develop quickly, and it can be life-threatening if it  causes you to become severely dehydrated (diabetic ketoacidosis or hyperglycemic hyperosmolar state). Severe hyperglycemia is a medical emergency. What are the causes? If you have diabetes, hyperglycemia may be caused by:  Diabetes medicine.  Medicines that increase blood glucose or affect your diabetes control.  Not eating enough, or not eating often enough.  Changes in physical activity level.  Being sick or having an infection. If you have prediabetes or undiagnosed diabetes:  Hyperglycemia may be caused by those conditions. If you do not have diabetes, hyperglycemia may be caused by:  Certain medicines, including steroid medicines, beta-blockers, epinephrine, and thiazide diuretics.  Stress.  Serious illness.  Surgery.  Diseases of the pancreas.  Infection. What increases the risk? Hyperglycemia is more likely to develop in people who have risk factors for diabetes, such as:  Having a family member with diabetes.  Having a gene for type 1 diabetes that is passed from parent to child (inherited).  Living in an area with cold weather  conditions.  Exposure to certain viruses.  Certain conditions in which the body's disease-fighting (immune) system attacks itself (autoimmune disorders).  Being overweight or obese.  Having an inactive (sedentary) lifestyle.  Having been diagnosed with insulin resistance.  Having a history of prediabetes, gestational diabetes, or polycystic ovarian syndrome (PCOS).  Being of American-Indian, African-American, Hispanic/Latino, or Asian/Pacific Islander descent. What are the signs or symptoms? Hyperglycemia may not cause any symptoms. If you do have symptoms, they may include early warning signs, such as:  Increased thirst.  Hunger.  Feeling very tired.  Needing to urinate more often than usual.  Blurry vision. Other symptoms may develop if hyperglycemia gets worse, such as:  Dry mouth.  Loss of appetite.  Fruity-smelling breath.  Weakness.  Unexpected or rapid weight gain or weight loss.  Tingling or numbness in the hands or feet.  Headache.  Skin that does not quickly return to normal after being lightly pinched and released (poor skin turgor).  Abdominal pain.  Cuts or bruises that are slow to heal. How is this diagnosed? Hyperglycemia is diagnosed with a blood test to measure your blood glucose level. This blood test is usually done while you are having symptoms. Your health care provider may also do a physical exam and review your medical history. You may have more tests to determine the cause of your hyperglycemia, such as:  A fasting blood glucose (FBG) test. You will not be allowed to eat (you will fast) for at least 8 hours before a blood sample is taken.  An A1c (hemoglobin A1c) blood test. This provides information about blood glucose control over the previous 2-3 months.  An oral glucose tolerance test (OGTT). This measures your blood glucose at two times: ? After fasting. This is your baseline blood glucose level. ? Two hours after drinking a  beverage that contains glucose. How is this treated? Treatment depends on the cause of your hyperglycemia. Treatment may include:  Taking medicine to regulate your blood glucose levels. If you take insulin or other diabetes medicines, your medicine or dosage may be adjusted.  Lifestyle changes, such as exercising more, eating healthier foods, or losing weight.  Treating an illness or infection, if this caused your hyperglycemia.  Checking your blood glucose more often.  Stopping or reducing steroid medicines, if these caused your hyperglycemia. If your hyperglycemia becomes severe and it results in hyperglycemic hyperosmolar state, you must be hospitalized and given IV fluids. Follow these instructions at home:  General instructions  Take over-the-counter  and prescription medicines only as told by your health care provider.  Do not use any products that contain nicotine or tobacco, such as cigarettes and e-cigarettes. If you need help quitting, ask your health care provider.  Limit alcohol intake to no more than 1 drink per day for nonpregnant women and 2 drinks per day for men. One drink equals 12 oz of beer, 5 oz of wine, or 1 oz of hard liquor.  Learn to manage stress. If you need help with this, ask your health care provider.  Keep all follow-up visits as told by your health care provider. This is important. Eating and drinking  You spoke with a dietitian during your hospital stay. Here is a summary of the recommendations that were discussed:   Decrease intake of potatoes and starchy vegetables in general (peas, corn, potatoes, sweet potatoes).  Decrease/eliminate intake of sugar sweetened beverages. Advise against V8 splash. Recommend Zero sugar flavorings/sweetners only  Weight loss alone will increase your body's sensitivity to your own insulin - recommend exercising as able  Keep reading labels! Aim for ~45-60g of carb at a meal. Make sure to follow the plate method (34%  of meal veg, 25% pro and 25% carb).  Eat healthy foods, such as: ? Lean proteins. ? Complex carbohydrates. ? Fresh fruits and vegetables. ? Low-fat dairy products. ? Healthy fats.  Drink enough fluid to keep your urine clear or pale yellow. If you have diabetes:  Make sure you know the symptoms of hyperglycemia.  Follow your diabetes management plan, as told by your health care provider. Make sure you: ? Take your insulin and medicines as directed. ? Follow your exercise plan. ? Follow your meal plan. Eat on time, and do not skip meals. ? Check your blood glucose as often as directed. Make sure to check your blood glucose before and after exercise. If you exercise longer or in a different way than usual, check your blood glucose more often. ? Follow your sick day plan whenever you cannot eat or drink normally. Make this plan in advance with your health care provider.  Share your diabetes management plan with people in your workplace, school, and household.  Check your urine for ketones when you are ill and as told by your health care provider.  Carry a medical alert card or wear medical alert jewelry. Contact a health care provider if:  Your blood glucose is at or above 240 mg/dL (13.3 mmol/L) for 2 days in a row.  You have problems keeping your blood glucose in your target range.  You have frequent episodes of hyperglycemia. Get help right away if:  You have difficulty breathing.  You have a change in how you think, feel, or act (mental status).  You have nausea or vomiting that does not go away. These symptoms may represent a serious problem that is an emergency. Do not wait to see if the symptoms will go away. Get medical help right away. Call your local emergency services (911 in the [place] Do not drive yourself to the hospital. Summary  Hyperglycemia occurs when the level of sugar (glucose) in the blood is too high.  Hyperglycemia is diagnosed with a blood test to  measure your blood glucose level. This blood test is usually done while you are having symptoms. Your health care provider may also do a physical exam and review your medical history.  If you have diabetes, follow your diabetes management plan as told by your health care provider.  Contact  your health care provider if you have problems keeping your blood glucose in your target range. This information is not intended to replace advice given to you by your health care provider. Make sure you discuss any questions you have with your health care provider. Document Released: 04/26/2001 Document Revised: 07/18/2016 Document Reviewed: 07/18/2016 Elsevier Interactive Patient Education  2019 [employer].

## 2019-04-03 NOTE — Progress Notes (Signed)
MD Arrien notified of increasing CBGs after stopping insulin stabilizer. Last two CBGs in 300s. No new orders at this time.

## 2019-04-03 NOTE — TOC Initial Note (Signed)
Transition of Care [facility]) - Initial/Assessment Note    Patient Details  Name: Cynthia Espinoza MRN: [id number] Date of Birth: November 09, 1956  [facility]) CM/SW Contact:    Cynthia Espinoza, Cynthia Oiler, RN Phone Number: 04/03/2019, 12:09 PM  Clinical Narrative:       Admit with DKA. From home, has been living with son and DIL. Independent. Has cane and RW with seat.   She could not afford her BCBS premium and has not had insurance this year. She has been seen by financial counselor and will be screened for any appropriate Medicaid programs.  She admits to not taking her DM medications because her stomach "was always upset". Her CBG meter is broken. Discussed [employer] at [facility] as cheapest option for DM supplies. She has been uses a pharmacy in [place] for her blood pressure medications and [facility] for her thyroid medication. Discussed [facility] list. Will give copy of list and Good RX card for future reference. Will provide MATCH voucher for help with medications.   She sees Cynthia Espinoza at [facility].    Expected Discharge Plan: Home/Self Care Barriers to Discharge: No Barriers Identified   Patient Goals and CMS Choice        Expected Discharge Plan and Services Expected Discharge Plan: Home/Self Care   Discharge Planning Services: CM Consult, MATCH Program, Medication Assistance   Living arrangements for the past 2 months: Single Family Home                    Prior Living Arrangements/Services Living arrangements for the past 2 months: Single Family Home Lives with:: Adult Children Patient language and need for interpreter reviewed:: Yes Do you feel safe going back to the place where you live?: Yes      Need for Family Participation in Patient Care: Yes (Comment) Care giver support system in place?: Yes (comment) Current home services: DME(cane, RW with seat) Criminal Activity/Legal Involvement Pertinent to Current Situation/Hospitalization: No -  Comment as needed  Activities of Daily Living Home Assistive Devices/Equipment: Eyeglasses, Cane (specify quad or straight), Walker (specify type) ADL Screening (condition at time of admission) Patient'Espinoza cognitive ability adequate to safely complete daily activities?: Yes Is the patient deaf or have difficulty hearing?: No Does the patient have difficulty seeing, even when wearing glasses/contacts?: No Does the patient have difficulty concentrating, remembering, or making decisions?: No Patient able to express need for assistance with ADLs?: Yes Does the patient have difficulty dressing or bathing?: No Independently performs ADLs?: Yes (appropriate for developmental age) Does the patient have difficulty walking or climbing stairs?: No Weakness of Legs: None Weakness of Arms/Hands: None      Emotional Assessment Appearance:: Appears stated age Attitude/Demeanor/Rapport: Engaged Affect (typically observed): Accepting Orientation: : Oriented to Self, Oriented to Place Alcohol / Substance Use: Not Applicable Psych Involvement: No (comment)  Admission diagnosis:  High Sugar  Patient Active Problem List   Diagnosis Date Noted  . Type 2 diabetes mellitus with other specified complication (HCC) 04/03/2019  . AKI (acute kidney injury) (HCC) 04/03/2019  . DKA (diabetic ketoacidoses) (HCC) 04/02/2019  . History of posttraumatic stress disorder (PTSD) 10/05/2018  . Palpitations 05/12/2017  . Premature beat 05/12/2017  . Moderate episode of recurrent major depressive disorder (HCC) 02/16/2017  . Recurrent major depressive disorder, in partial remission (HCC) 01/24/2017  . Diabetes mellitus without complication (HCC) 01/10/2017  . Essential hypertension 08/30/2016  . Hypothyroidism 08/30/2016  . Generalized OA 08/30/2016  . Fibromyalgia 08/30/2016  .  Chronic bilateral low back pain with bilateral sciatica 08/30/2016  . Chronic pain of both knees 08/30/2016  . Pain in both wrists  08/30/2016  . Pain in joint involving ankle and foot 08/30/2016  . Pain of both shoulder joints 08/30/2016  . Morbid obesity (HCC) 08/30/2016   PCP:  Cynthia Espinoza, Cynthia S, PA-C Pharmacy:   [facility] [place] - [address] [place] [id number] Phone: [phone] Fax: [phone]   Readmission Risk Interventions No flowsheet data found.

## 2019-04-04 LAB — BASIC METABOLIC PANEL
Anion gap: 12 (ref 5–15)
BUN: 29 mg/dL — ABNORMAL HIGH (ref 6–20)
CO2: 24 mmol/L (ref 22–32)
Calcium: 8.4 mg/dL — ABNORMAL LOW (ref 8.9–10.3)
Chloride: 103 mmol/L (ref 98–111)
Creatinine, Ser: 0.84 mg/dL (ref 0.44–1.00)
GFR calc Af Amer: >60 mL/min (ref >60)
GFR calc non Af Amer: >60 mL/min (ref >60)
Glucose, Bld: 305 mg/dL — ABNORMAL HIGH (ref 70–99)
Potassium: 3.9 mmol/L (ref 3.5–5.1)
Sodium: 139 mmol/L (ref 135–145)

## 2019-04-04 LAB — CBC WITH DIFFERENTIAL/PLATELET
Basophils Absolute: 0.1 10*3/uL (ref 0.0–0.2)
Basos: 1 %
EOS (ABSOLUTE): 0.1 10*3/uL (ref 0.0–0.4)
Eos: 1 %
Hematocrit: 47.3 % — ABNORMAL HIGH (ref 34.0–46.6)
Hemoglobin: 15.2 g/dL (ref 11.1–15.9)
Immature Grans (Abs): 0 10*3/uL (ref 0.0–0.1)
Immature Granulocytes: 0 %
Lymphocytes Absolute: 1.6 10*3/uL (ref 0.7–3.1)
Lymphs: 16 %
MCH: 30.9 pg (ref 26.6–33.0)
MCHC: 32.1 g/dL (ref 31.5–35.7)
MCV: 96 fL (ref 79–97)
Monocytes Absolute: 1 10*3/uL — ABNORMAL HIGH (ref 0.1–0.9)
Monocytes: 9 %
Neutrophils Absolute: 7.6 10*3/uL — ABNORMAL HIGH (ref 1.4–7.0)
Neutrophils: 73 %
Platelets: 239 10*3/uL (ref 150–450)
RBC: 4.92 x10E6/uL (ref 3.77–5.28)
RDW: 12 % (ref 11.7–15.4)
WBC: 10.5 10*3/uL (ref 3.4–10.8)

## 2019-04-04 LAB — CMP14+EGFR
ALT: 40 IU/L — ABNORMAL HIGH (ref 0–32)
AST: 23 IU/L (ref 0–40)
Albumin/Globulin Ratio: 1.3 (ref 1.2–2.2)
Albumin: 4.4 g/dL (ref 3.8–4.9)
Alkaline Phosphatase: 80 IU/L (ref 39–117)
BUN/Creatinine Ratio: 22 (ref 12–28)
BUN: 25 mg/dL (ref 8–27)
Bilirubin Total: 0.6 mg/dL (ref 0.0–1.2)
CO2: 18 mmol/L — ABNORMAL LOW (ref 20–29)
Calcium: 9.7 mg/dL (ref 8.7–10.3)
Chloride: 88 mmol/L — ABNORMAL LOW (ref 96–106)
Creatinine, Ser: 1.15 mg/dL — ABNORMAL HIGH (ref 0.57–1.00)
GFR calc Af Amer: 60 mL/min/{1.73_m2} (ref >59)
GFR calc non Af Amer: 52 mL/min/{1.73_m2} — ABNORMAL LOW (ref >59)
Globulin, Total: 3.4 g/dL (ref 1.5–4.5)
Glucose: 861 mg/dL (ref 65–99)
Potassium: 4.7 mmol/L (ref 3.5–5.2)
Sodium: 131 mmol/L — ABNORMAL LOW (ref 134–144)
Total Protein: 7.8 g/dL (ref 6.0–8.5)

## 2019-04-04 LAB — ROCKY MTN SPOTTED FVR ABS PNL(IGG+IGM)
RMSF IgG: UNDETERMINED
RMSF IgM: 0.19 index (ref 0.00–0.89)

## 2019-04-04 LAB — ALPHA-GAL PANEL
Alpha Gal IgE*: 0.3 kU/L — ABNORMAL HIGH (ref <0.10)
Beef (Bos spp) IgE: 0.13 kU/L (ref <0.35)
Class Interpretation: 0
Class Interpretation: 0
Lamb/Mutton (Ovis spp) IgE: <0.1 kU/L (ref <0.35)
Pork (Sus spp) IgE: <0.1 kU/L (ref <0.35)

## 2019-04-04 LAB — GLUCOSE, CAPILLARY
Glucose-Capillary: 302 mg/dL — ABNORMAL HIGH (ref 70–99)
Glucose-Capillary: 309 mg/dL — ABNORMAL HIGH (ref 70–99)
Glucose-Capillary: 279 mg/dL — ABNORMAL HIGH (ref 70–99)
Glucose-Capillary: 253 mg/dL — ABNORMAL HIGH (ref 70–99)

## 2019-04-04 LAB — ALLERGEN, STRAWBERRY, F44: Allergen Strawberry IgE: <0.1 kU/L

## 2019-04-04 LAB — LYME AB/WESTERN BLOT REFLEX
LYME DISEASE AB, QUANT, IGM: <0.8 index (ref 0.00–0.79)
Lyme IgG/IgM Ab: <0.91 {ISR} (ref 0.00–0.90)

## 2019-04-04 LAB — HIV ANTIBODY (ROUTINE TESTING W REFLEX): HIV Screen 4th Generation wRfx: NONREACTIVE

## 2019-04-04 LAB — RMSF, IGG, IFA: RMSF, IGG, IFA: 1:256 {titer} — ABNORMAL HIGH

## 2019-04-04 LAB — TSH: TSH: 3.12 u[IU]/mL (ref 0.450–4.500)

## 2019-04-04 MED ORDER — BENAZEPRIL HCL 10 MG PO TABS
40.0000 mg | ORAL_TABLET | Freq: Every day | ORAL | Status: DC
  Administered 2019-04-05: 09:00:00 40 mg via ORAL
  Filled 2019-04-04: qty 4

## 2019-04-04 MED ORDER — INSULIN ASPART 100 UNIT/ML ~~LOC~~ SOLN
0.0000 [IU] | Freq: Three times a day (TID) | SUBCUTANEOUS | Status: DC
  Administered 2019-04-04: 17:00:00 8 [IU] via SUBCUTANEOUS
  Administered 2019-04-04 (×2): 11 [IU] via SUBCUTANEOUS
  Administered 2019-04-05: 09:00:00 5 [IU] via SUBCUTANEOUS
  Administered 2019-04-05: 12:00:00 8 [IU] via SUBCUTANEOUS

## 2019-04-04 MED ORDER — INSULIN DETEMIR 100 UNIT/ML ~~LOC~~ SOLN
30.0000 [IU] | Freq: Two times a day (BID) | SUBCUTANEOUS | Status: DC
  Administered 2019-04-04 (×2): 30 [IU] via SUBCUTANEOUS
  Filled 2019-04-04 (×9): qty 0.3

## 2019-04-04 NOTE — Progress Notes (Signed)
PROGRESS NOTE    Cynthia Espinoza  LAG:[id number] DOB: Dec 02, 1955 DOA: 04/02/2019 PCP: Terald Sleeper, PA-C    Brief Narrative:  63 year old female who presented with hyperglycemia.  She does have significant past medical history for hypertension, type 2 diabetes mellitus, depression and PTSD.  She was referred to the hospital by her primary care physician after finding very elevated glucose.  Patient has been off antihyperglycemic agents for the last 8 months.  For the last 7 days she has been developing worsening polyuria, polydipsia, easy fatigability, nausea and vomiting.  The day before admission she was prescribed doxycycline and prednisone for a generalized rash.  Positive tick bite about 4 weeks ago.  On her initial physical examination her blood pressure was 110/54, heart rate 88, respiratory rate 19, temperature 97.9, oxygen saturation 94%.  Dry mucous membranes, her lungs are clear to auscultation bilaterally, heart S1-S2 present with me, abdomen protuberant and nontender.  She had a generalized papular macular rash. Sodium 131, potassium 4.5, chloride 91, bicarb 20, glucose 930, BUN 48, creatinine 1.43, anion gap 20, white count 11.4, hemoglobin 14.9, hematocrit 47.0, platelets 260.  SARS COVID-19 negative.  Urinalysis more than 500 glucose.  Patient was admitted to the hospital with a working diagnosis of diabetes ketoacidosis   Assessment & Plan:   Principal Problem:   DKA (diabetic ketoacidoses) ([place]) Active Problems:   Essential hypertension   Hypothyroidism   Fibromyalgia   Morbid obesity ([place])   History of posttraumatic stress disorder (PTSD)   Type 2 diabetes mellitus with other specified complication ([place])   AKI (acute kidney injury) ([place])   1. DKA. Her anion gap continue to be closed and she is tolerating po well, her glucose continues to be elevated and has required high doses of insulin. Has received a total of 40 units of long acting insulin and 29 units of short  acting insulin. Will increase basal insulin to 30 units bid and will continue with 10 units of pre-meal insulin. Change insulin sliding scale to moderate sensitivity. Patient continue to be at high risk of worsening hyperglycemia and DKA.   2. AKI with hypokalemia. Renal function with serum cr at 0.84 with K at 3,9, will follow on renal panel in am.   3. T2DM. Hgb A1c 13.7 on 03/2017. Will continue with insulin therapy, diabetic teaching. Will plan to discharge patient on insulin.   4. HTN. Continue amlodipine for blood pressure control.    5. Anxiety. Continue alprazolam.  6. Hypothyroid.  On levothyroxine.   7. Morbid obesity. BMI is 44,5. Out of bed with meals and ambulate.   8. Resolving papular rash. Dry skin lower extremities, will recommend moisturizing lotion.   Body mass index is 44.58 kg/m. Malnutrition Type:      Malnutrition Characteristics:      Nutrition Interventions:     RN Pressure Injury Documentation:     Consultants:     Procedures:     Antimicrobials:       Subjective: Patient is feeling better but not yet back to baseline, no nausea or vomiting, continue to have dry mouth. No chest pain or dyspnea.   Objective: Vitals:   04/04/19 0400 04/04/19 0445 04/04/19 0500 04/04/19 0600  BP:  121/80 130/69 122/62  Pulse: 75 69 69 70  Resp: 13 (!) _0 Temp: 97.8 F (36.6 C)     TempSrc: Oral     SpO2: 96% 96% 97% 94%  Weight:      Height:  Intake/Output Summary (Last 24 hours) at 04/04/2019 0738 Last data filed at 04/03/2019 1740 Gross per 24 hour  Intake 1858.96 ml  Output 2 ml  Net 1856.96 ml   Filed Weights   04/02/19 1827 04/02/19 2347  Weight: 136.1 kg 129.1 kg    Examination:   General: deconditioned  Neurology: Awake and alert, non focal  E ENT: mild pallor, no icterus, oral mucosa moist Cardiovascular: No JVD. S1-S2 present, rhythmic, no gallops, rubs, or murmurs. No lower extremity edema.  Pulmonary: positive breath sounds bilaterally, adequate air movement, no wheezing, rhonchi or rales. Gastrointestinal. Abdomen with no organomegaly, non tender, no rebound or guarding Skin. No rashes Musculoskeletal: no joint deformities     Data Reviewed: I have personally reviewed following labs and imaging studies  CBC: Recent Labs  Lab 04/01/19 1206 04/02/19 1855  WBC 10.5 11.4*  NEUTROABS 7.6*  --   HGB 15.2 14.9  HCT 47.3* 47.0*  MCV 96 98.7  PLT 239 096   Basic Metabolic Panel: Recent Labs  Lab 04/02/19 1855 04/03/19 0037 04/03/19 0416 04/03/19 1548 04/04/19 0445  NA 131* 138 143 137 139  K 4.5 3.3* 3.5 3.6 3.9  CL 91* 99 109 102 103  CO2 20* _0 GLUCOSE 930* 457* 244* 383* 305*  BUN 48* 45* 47* 37* 29*  CREATININE 1.43* 1.16* 0.99 0.92 0.84  CALCIUM 9.3 9.0 8.8* 8.2* 8.4*   GFR: Estimated Creatinine Clearance: 99.6 mL/min (by C-G formula based on SCr of 0.84 mg/dL). Liver Function Tests: Recent Labs  Lab 04/01/19 1206  AST 23  ALT 40*  ALKPHOS 80  BILITOT 0.6  PROT 7.8  ALBUMIN 4.4   No results for input(s): LIPASE, AMYLASE in the last 168 hours. No results for input(s): AMMONIA in the last 168 hours. Coagulation Profile: No results for input(s): INR, PROTIME in the last 168 hours. Cardiac Enzymes: No results for input(s): CKTOTAL, CKMB, CKMBINDEX, TROPONINI in the last 168 hours. BNP (last 3 results) No results for input(s): PROBNP in the last 8760 hours. HbA1C: Recent Labs    04/01/19 1206  HGBA1C 13.7*   CBG: Recent Labs  Lab 04/03/19 1655 04/03/19 1809 04/03/19 1901 04/03/19 2001 04/03/19 2157  GLUCAP 337* 364* 348* 324* 269*   Lipid Profile: No results for input(s): CHOL, HDL, LDLCALC, TRIG, CHOLHDL, LDLDIRECT in the last 72 hours. Thyroid Function Tests: Recent Labs    04/01/19 1206  TSH 3.120   Anemia Panel: No results for input(s): VITAMINB12, FOLATE, FERRITIN, TIBC, IRON, RETICCTPCT in the last 72 hours.     Radiology Studies: I have reviewed all of the imaging during this hospital visit personally     Scheduled Meds: . amLODipine  10 mg Oral Daily  . Chlorhexidine Gluconate Cloth  6 each Topical Q0600  . enoxaparin (LOVENOX) injection  0.5 mg/kg Subcutaneous Q24H  . insulin aspart  0-15 Units Subcutaneous TID WC  . insulin aspart  10 Units Subcutaneous TID WC  . insulin detemir  30 Units Subcutaneous BID  . insulin starter kit- pen needles  1 kit Other Once  . levothyroxine  200 mcg Oral QAC breakfast   Continuous Infusions:   LOS: 2 days        Mauricio Gerome Apley, MD

## 2019-04-04 NOTE — Progress Notes (Signed)
Discussed in detail regarding Diabetes. Diabetes coordinator called but did not see pt face to face. Explained sliding scale insulin and pt demonstrated return feedback via self injection of appropriate amount of insulin. Gave pt handouts on Diabetes and encouraged to read and ask questions. Pt had multiple questions in which RN answered to the best of knowledge. Pt is specifically worried about managing diabetes at home and not having funds (no insurance) to obtain insulin. Pt understands that diet and exercise play a major role in controlling glucose levels and is hopeful with diet and exercise that she can come off of insulin. Diabetic coordinator stated that she would call pt once transferred to med-surg floor.

## 2019-04-04 NOTE — TOC Progression Note (Addendum)
Transition of Care [facility]) - Progression Note    Patient Details  Name: Cynthia Espinoza MRN: [id number] Date of Birth: [birth year]  [facility] CM/SW Contact  Michoel Kunin, Chrystine Oiler, RN Phone Number: 04/04/2019, 1:38 PM  Clinical Narrative:   Provided MATCH to patient and discussed. Also gave Good RX and $4 dollar [facility] list. Added [place] [facility] to chart.    ADDENDUM: Noted that BCBS has been added to chart. VM left with financial counselor to discuss.  [facility] referral has been made by attending.  Patient reports that BCBS should not be active, she has not paid premiums this year as cost went up from $10 to $105 per month.   Expected Discharge Plan: Home/Self Care Barriers to Discharge: No Barriers Identified  Expected Discharge Plan and Services Expected Discharge Plan: Home/Self Care   Discharge Planning Services: CM Consult, MATCH Program, Medication Assistance   Living arrangements for the past 2 months: Single Family Home                                       Social Determinants of Health (SDOH) Interventions    Readmission Risk Interventions No flowsheet data found.

## 2019-04-04 NOTE — Progress Notes (Signed)
Spoke with patient on the phone. She said that she had given herself an injection around 1300 and had done well. She stated that she wants to get into a good regimen for better control of her blood sugars, better diet. She is concerned about the cost of insulin and would like a fairly easy insulin regimen to follow. She stated that she could afford $42.88 for [employer] [facility] 70/30 insulin pens. Taking insulin twice a day should not be a problem for her.   Encouraged her to follow up with her PCP as soon as she could after discharge. She will need a home blood glucose meter, strips, and lancets and will need to keep a record of blood sugars at home.   Will continue to follow while in the hospital.    Smith Mince RN BSN CDE Diabetes Coordinator Pager: [phone]  8am-5pm

## 2019-04-05 LAB — BASIC METABOLIC PANEL
Anion gap: 10 (ref 5–15)
BUN: 19 mg/dL (ref 6–20)
CO2: 23 mmol/L (ref 22–32)
Calcium: 8 mg/dL — ABNORMAL LOW (ref 8.9–10.3)
Chloride: 103 mmol/L (ref 98–111)
Creatinine, Ser: 0.64 mg/dL (ref 0.44–1.00)
GFR calc Af Amer: >60 mL/min (ref >60)
GFR calc non Af Amer: >60 mL/min (ref >60)
Glucose, Bld: 235 mg/dL — ABNORMAL HIGH (ref 70–99)
Potassium: 3.1 mmol/L — ABNORMAL LOW (ref 3.5–5.1)
Sodium: 136 mmol/L (ref 135–145)

## 2019-04-05 LAB — GLUCOSE, CAPILLARY
Glucose-Capillary: 243 mg/dL — ABNORMAL HIGH (ref 70–99)
Glucose-Capillary: 252 mg/dL — ABNORMAL HIGH (ref 70–99)

## 2019-04-05 MED ORDER — NYSTATIN 100000 UNIT/ML MT SUSP: 5.0000 mL | Freq: Four times a day (QID) | OROMUCOSAL | 0 refills | Status: AC

## 2019-04-05 MED ORDER — INSULIN ASPART PROT & ASPART (70-30 MIX) 100 UNIT/ML ~~LOC~~ SUSP
40.0000 [IU] | Freq: Two times a day (BID) | SUBCUTANEOUS | Status: DC
  Filled 2019-04-05: qty 10

## 2019-04-05 MED ORDER — NYSTATIN 100000 UNIT/ML MT SUSP
5.0000 mL | Freq: Four times a day (QID) | OROMUCOSAL | Status: DC
  Administered 2019-04-05: 12:00:00 500000 [IU] via OROMUCOSAL
  Filled 2019-04-05: qty 5

## 2019-04-05 MED ORDER — BLOOD GLUCOSE METER KIT: PACK | 0 refills | Status: DC

## 2019-04-05 MED ORDER — INSULIN NPH ISOPHANE & REGULAR (70-30) 100 UNIT/ML ~~LOC~~ SUSP: 40.0000 [IU] | Freq: Two times a day (BID) | SUBCUTANEOUS | 0 refills | Status: DC

## 2019-04-05 MED ORDER — INSULIN DETEMIR 100 UNIT/ML ~~LOC~~ SOLN
40.0000 [IU] | Freq: Two times a day (BID) | SUBCUTANEOUS | Status: DC
  Filled 2019-04-05 (×7): qty 0.4

## 2019-04-05 MED ORDER — INSULIN PEN NEEDLE 29G X 5MM MISC: 1.0000 [IU] | Freq: Two times a day (BID) | 0 refills | Status: DC

## 2019-04-05 MED ORDER — POTASSIUM CHLORIDE CRYS ER 20 MEQ PO TBCR
40.0000 meq | EXTENDED_RELEASE_TABLET | ORAL | Status: AC
  Administered 2019-04-05 (×2): 40 meq via ORAL
  Filled 2019-04-05 (×2): qty 2

## 2019-04-05 MED ORDER — INSULIN DETEMIR 100 UNIT/ML ~~LOC~~ SOLN
35.0000 [IU] | Freq: Two times a day (BID) | SUBCUTANEOUS | Status: DC
  Filled 2019-04-05 (×7): qty 0.35

## 2019-04-05 NOTE — Progress Notes (Signed)
Removed IV-clean, dry, intact. Reviewed d/c paperwork with patient. Reviewed new medications and med changes. Answered all questions. Wheeled stable patient and belongings to short stay entrance where she was picked up by her son to d/c to home. Gave her preprinted prescription for glucose meter

## 2019-04-05 NOTE — Discharge Summary (Signed)
Physician Discharge Summary  MARKAYLA REICHART BSJ:[id number] DOB: August 10, 1956 DOA: 04/02/2019  PCP: Terald Sleeper, PA-C  Admit date: 04/02/2019 Discharge date: 04/05/2019  Admitted From: Home  Disposition:  Home   Recommendations for Outpatient Follow-up and new medication changes:  1. Follow up with Particia Nearing PA-C in 7 days.  2. Patient has been placed on insulin therapy. 3. Instructed to check capillary glucose before meals and keep a log.   Home Health: no   Equipment/Devices: glucometer    Discharge Condition: Stable  CODE STATUS: full  Diet recommendation: heart healthy and diabetic prudent.   Brief/Interim Summary: 63 year old female who presented with hyperglycemia. She does have significant past medical history for hypertension, type 2 diabetes mellitus, depression and PTSD. She was referred to the hospital by her primary care physician after finding veryelevated glucose. Patient has been off antihyperglycemic agents for the last 8 months.For the last 7 days she has been developing worsening polyuria, polydipsia, easy fatigability, nausea and vomiting. The day before admission she was prescribed doxycycline and prednisone for a generalized rash. Positive tick bite about 4 weeks ago. On her initial physical examination her blood pressure was 110/54, heart rate 88, respiratory rate 19, temperature 97.9, oxygen saturation 94%.Dry mucous membranes, her lungs were clear to auscultation bilaterally, heart S1-S2 present and rhythmic, abdomen protuberant and nontender. She had a generalized papular macular rash. Sodium 131, potassium 4.5, chloride 91, bicarb 20, glucose 930, BUN 48, creatinine 1.43, anion gap 20,white count 11.4, hemoglobin 14.9, hematocrit 47.0, platelets 260.SARS COVID-19 negative.Urinalysis more than 500 glucose.  Patientwas admitted to the hospital with a working diagnosis of diabetes ketoacidosis  1.  Diabetes ketoacidosis.  Patient was admitted to the  stepdown unit, she was placed on intravenous fluids and intravenous insulin per protocol, her anion gap closed, her diet was advanced, and she was successfully transitioned to subcutaneous insulin.  She has significant insulin resistance and required high doses of insulin.  Her electrolytes were corrected with potassium chloride, her discharge anion gap is 10.  2.  Type 2 diabetes mellitus.  Patient has chronic uncontrolled hyperglycemia, her hemoglobin A1c in May 18,2020 was 13.7.  She showed significant insulin resistance, her insulin dose has been daily modified, today she will be discharged on Insulin 70/30 ( in order her to afford treatment), 40 units twice daily, with capillary glucose monitoring before meals.  Follow-up within 7 days, if persistent hyperglycemia to consider adding metformin.  Patient had diabetic teaching, and lifestyle modification counseling.  Her fasting glucose at discharge is 235 mg/dl.   3.  Acute kidney injury with hypokalemia.  Hypovolemic related acute kidney injury, improved with intravenous fluids, potassium was corrected with potassium chloride, her discharge creatinine is 0.64.  4.  Hypertension.  Blood pressure control with amlodipine and benazepril.  5.  Hypothyroid.  Continue levothyroxine.  6.  Anxiety.  On alprazolam.  7.  Resolving macular papular rash.  Avoid steroids due to hyperglycemia, continue local wound care with moisturizing lotion.  Currently patient has been tested for Lyme disease as an outpatient.  Currently patient has remained afebrile.  8.  Morbid obesity.  Her calculated BMI is 44.5.  She was counseled about lifestyle modifications and weight loss.  9. Oral thrush.  Patient has been placed on nystatin swish and swallow for the next 10 days.   Discharge Diagnoses:  Principal Problem:   DKA (diabetic ketoacidoses) ([place]) Active Problems:   Essential hypertension   Hypothyroidism   Fibromyalgia   Morbid obesity (  [place])   History of  posttraumatic stress disorder (PTSD)   Type 2 diabetes mellitus with other specified complication ([place])   AKI (acute kidney injury) ([place])    Discharge Instructions   Allergies as of 04/05/2019      Reactions   Asa [aspirin] Rash   Penicillins Rash   Did it involve swelling of the face/tongue/throat, SOB, or low BP? Yes Did it involve sudden or severe rash/hives, skin peeling, or any reaction on the inside of your mouth or nose? Yes Did you need to seek medical attention at a hospital or doctor's office? Yes When did it last happen?Over 10 years ago If all above answers are "NO", may proceed with cephalosporin use.      Medication List    STOP taking these medications   doxycycline 100 MG capsule Commonly known as:  Vibramycin   glipiZIDE 10 MG tablet Commonly known as:  GLUCOTROL   predniSONE 10 MG tablet Commonly known as:  DELTASONE     TAKE these medications   ALPRAZolam 0.5 MG tablet Commonly known as:  XANAX Take 1 tablet (0.5 mg total) by mouth 2 (two) times daily as needed for anxiety.   amLODipine 10 MG tablet Commonly known as:  NORVASC TAKE ONE (1) TABLET EACH DAY What changed:  See the new instructions.   benazepril 40 MG tablet Commonly known as:  LOTENSIN TAKE ONE (1) TABLET EACH DAY What changed:  See the new instructions.   blood glucose meter kit and supplies Dispense based on patient and insurance preference. Use up to four times daily as directed. (FOR ICD-10 E10.9, E11.9).   hydrochlorothiazide 25 MG tablet Commonly known as:  HYDRODIURIL TAKE ONE (1) TABLET EACH DAY What changed:  See the new instructions.   insulin NPH-regular Human (70-30) 100 UNIT/ML injection Commonly known as:  NovoLIN 70/30 ReliOn Inject 40 Units into the skin 2 (two) times daily with a meal for 30 days.   Insulin Pen Needle 29G X 5MM Misc 1 Units by Does not apply route 2 (two) times a day. Insulin needles for relion insulin pen.   levothyroxine 200 MCG  tablet Commonly known as:  SYNTHROID TAKE ONE TABLET EACH MORNING BEFORE BREAKFAST What changed:    how much to take  how to take this  when to take this  additional instructions   nystatin 100000 UNIT/ML suspension Commonly known as:  MYCOSTATIN Use as directed 5 mLs (500,000 Units total) in the mouth or throat 4 (four) times daily for 10 days. Swish and swallow.       Allergies  Allergen Reactions  . Asa [Aspirin] Rash  . Penicillins Rash    Did it involve swelling of the face/tongue/throat, SOB, or low BP? Yes Did it involve sudden or severe rash/hives, skin peeling, or any reaction on the inside of your mouth or nose? Yes Did you need to seek medical attention at a hospital or doctor's office? Yes When did it last happen?Over 10 years ago If all above answers are "NO", may proceed with cephalosporin use.     Consultations:     Procedures/Studies:  No results found.   Procedures:   Subjective: Patient is feeling better, no nausea or vomiting, continue to have dry mouth and burning sensation in her tongue.   Discharge Exam: Vitals:   04/05/19 0409 04/05/19 0800  BP: (!) 142/100 (!) 146/95  Pulse: 73 70  Resp: 16 18  Temp: 97.9 F (36.6 C) 98.1 F (36.7 C)  SpO2:  97% 98%   Vitals:   04/04/19 1937 04/04/19 2356 04/05/19 0409 04/05/19 0800  BP: (!) 145/93 (!) 141/91 (!) 142/100 (!) 146/95  Pulse: 79 73 73 70  Resp: '18 16 16 18  ' Temp: 98.4 F (36.9 C) 98.3 F (36.8 C) 97.9 F (36.6 C) 98.1 F (36.7 C)  TempSrc: Oral Oral Oral Oral  SpO2: 99% 96% 97% 98%  Weight:      Height:        General: Not in pain or dyspnea,  Neurology: Awake and alert, non focal  E ENT: no pallor, no icterus, oral mucosa moist Cardiovascular: No JVD. S1-S2 present, rhythmic, no gallops, rubs, or murmurs. No lower extremity edema. Pulmonary: positive breath sounds bilaterally, adequate air movement, no wheezing, rhonchi or rales. Gastrointestinal. Abdomen with no  organomegaly, non tender, no rebound or guarding Skin. No rashes Musculoskeletal: no joint deformities   The results of significant diagnostics from this hospitalization (including imaging, microbiology, ancillary and laboratory) are listed below for reference.     Microbiology: Recent Results (from the past 240 hour(s))  Microscopic Examination     Status: Abnormal   Collection Time: 04/01/19 12:18 PM  Result Value Ref Range Status   WBC, UA 6-10 (A) 0 - 5 /hpf Final   RBC 0-2 0 - 2 /hpf Final   Epithelial Cells (non renal) 0-10 0 - 10 /hpf Final   Renal Epithel, UA None seen None seen /hpf Final   Bacteria, UA Few None seen/Few Final   Yeast, UA Present None seen Final  SARS Coronavirus 2 (CEPHEID - Performed in [place] hospital lab), Hosp Order     Status: None   Collection Time: 04/02/19  6:43 PM  Result Value Ref Range Status   SARS Coronavirus 2 NEGATIVE NEGATIVE Final    Comment: (NOTE) If result is NEGATIVE SARS-CoV-2 target nucleic acids are NOT DETECTED. The SARS-CoV-2 RNA is generally detectable in upper and lower  respiratory specimens during the acute phase of infection. The lowest  concentration of SARS-CoV-2 viral copies this assay can detect is 250  copies / mL. A negative result does not preclude SARS-CoV-2 infection  and should not be used as the sole basis for treatment or other  patient management decisions.  A negative result may occur with  improper specimen collection / handling, submission of specimen other  than nasopharyngeal swab, presence of viral mutation(s) within the  areas targeted by this assay, and inadequate number of viral copies  (<250 copies / mL). A negative result must be combined with clinical  observations, patient history, and epidemiological information. If result is POSITIVE SARS-CoV-2 target nucleic acids are DETECTED. The SARS-CoV-2 RNA is generally detectable in upper and lower  respiratory specimens dur ing the acute phase  of infection.  Positive  results are indicative of active infection with SARS-CoV-2.  Clinical  correlation with patient history and other diagnostic information is  necessary to determine patient infection status.  Positive results do  not rule out bacterial infection or co-infection with other viruses. If result is PRESUMPTIVE POSTIVE SARS-CoV-2 nucleic acids MAY BE PRESENT.   A presumptive positive result was obtained on the submitted specimen  and confirmed on repeat testing.  While 2019 novel coronavirus  (SARS-CoV-2) nucleic acids may be present in the submitted sample  additional confirmatory testing may be necessary for epidemiological  and / or clinical management purposes  to differentiate between  SARS-CoV-2 and other Sarbecovirus currently known to infect humans.  If clinically  indicated additional testing with an alternate test  methodology [phone]) is advised. The SARS-CoV-2 RNA is generally  detectable in upper and lower respiratory sp ecimens during the acute  phase of infection. The expected result is Negative. Fact Sheet for Patients:  StrictlyIdeas.no Fact Sheet for Healthcare Providers: BankingDealers.co.za This test is not yet approved or cleared by the [place] FDA and has been authorized for detection and/or diagnosis of SARS-CoV-2 by FDA under an Emergency Use Authorization (EUA).  This EUA will remain in effect (meaning this test can be used) for the duration of the COVID-19 declaration under Section 564(b)(1) of the Act, 21 U.S.C. section 360bbb-3(b)(1), unless the authorization is terminated or revoked sooner. Performed at [facility], [address], [place], [place] [address]   MRSA PCR Screening     Status: None   Collection Time: 04/02/19 11:22 PM  Result Value Ref Range Status   MRSA by PCR NEGATIVE NEGATIVE Final    Comment:        The GeneXpert MRSA Assay (FDA approved for NASAL  specimens only), is one component of a comprehensive MRSA colonization surveillance program. It is not intended to diagnose MRSA infection nor to guide or monitor treatment for MRSA infections. Performed at [facility], [address] [place], [place] [address]      Labs: BNP (last 3 results) No results for input(s): BNP in the last 8760 hours. Basic Metabolic Panel: Recent Labs  Lab 04/03/19 0037 04/03/19 0416 04/03/19 1548 04/04/19 0445 04/05/19 0623  NA 138 143 137 139 136  K 3.3* 3.5 3.6 3.9 3.1*  CL 99 109 102 103 103  CO2 '22 24 23 24 23  ' GLUCOSE 457* 244* 383* 305* 235*  BUN 45* 47* 37* 29* 19  CREATININE 1.16* 0.99 0.92 0.84 0.64  CALCIUM 9.0 8.8* 8.2* 8.4* 8.0*   Liver Function Tests: Recent Labs  Lab 04/01/19 1206  AST 23  ALT 40*  ALKPHOS 80  BILITOT 0.6  PROT 7.8  ALBUMIN 4.4   No results for input(s): LIPASE, AMYLASE in the last 168 hours. No results for input(s): AMMONIA in the last 168 hours. CBC: Recent Labs  Lab 04/01/19 1206 04/02/19 1855  WBC 10.5 11.4*  NEUTROABS 7.6*  --   HGB 15.2 14.9  HCT 47.3* 47.0*  MCV 96 98.7  PLT 239 260   Cardiac Enzymes: No results for input(s): CKTOTAL, CKMB, CKMBINDEX, TROPONINI in the last 168 hours. BNP: Invalid input(s): POCBNP CBG: Recent Labs  Lab 04/04/19 0758 04/04/19 1110 04/04/19 1606 04/04/19 2016 04/05/19 0714  GLUCAP 302* 309* 279* 253* 243*   D-Dimer No results for input(s): DDIMER in the last 72 hours. Hgb A1c No results for input(s): HGBA1C in the last 72 hours. Lipid Profile No results for input(s): CHOL, HDL, LDLCALC, TRIG, CHOLHDL, LDLDIRECT in the last 72 hours. Thyroid function studies No results for input(s): TSH, T4TOTAL, T3FREE, THYROIDAB in the last 72 hours.  Invalid input(s): FREET3 Anemia work up No results for input(s): VITAMINB12, FOLATE, FERRITIN, TIBC, IRON, RETICCTPCT in the last 72 hours. Urinalysis    Component Value Date/Time   COLORURINE STRAW (A)  04/02/2019 1830   APPEARANCEUR CLEAR 04/02/2019 1830   APPEARANCEUR Clear 04/01/2019 1218   LABSPEC 1.025 04/02/2019 1830   PHURINE 5.0 04/02/2019 1830   GLUCOSEU >=500 (A) 04/02/2019 1830   HGBUR NEGATIVE 04/02/2019 1830   BILIRUBINUR NEGATIVE 04/02/2019 1830   BILIRUBINUR Negative 04/01/2019 1218   KETONESUR 20 (A) 04/02/2019 1830   PROTEINUR NEGATIVE  04/02/2019 1830   NITRITE NEGATIVE 04/02/2019 1830   LEUKOCYTESUR SMALL (A) 04/02/2019 1830   Sepsis Labs Invalid input(s): PROCALCITONIN,  WBC,  LACTICIDVEN Microbiology Recent Results (from the past 240 hour(s))  Microscopic Examination     Status: Abnormal   Collection Time: 04/01/19 12:18 PM  Result Value Ref Range Status   WBC, UA 6-10 (A) 0 - 5 /hpf Final   RBC 0-2 0 - 2 /hpf Final   Epithelial Cells (non renal) 0-10 0 - 10 /hpf Final   Renal Epithel, UA None seen None seen /hpf Final   Bacteria, UA Few None seen/Few Final   Yeast, UA Present None seen Final  SARS Coronavirus 2 (CEPHEID - Performed in [place] hospital lab), Hosp Order     Status: None   Collection Time: 04/02/19  6:43 PM  Result Value Ref Range Status   SARS Coronavirus 2 NEGATIVE NEGATIVE Final    Comment: (NOTE) If result is NEGATIVE SARS-CoV-2 target nucleic acids are NOT DETECTED. The SARS-CoV-2 RNA is generally detectable in upper and lower  respiratory specimens during the acute phase of infection. The lowest  concentration of SARS-CoV-2 viral copies this assay can detect is 250  copies / mL. A negative result does not preclude SARS-CoV-2 infection  and should not be used as the sole basis for treatment or other  patient management decisions.  A negative result may occur with  improper specimen collection / handling, submission of specimen other  than nasopharyngeal swab, presence of viral mutation(s) within the  areas targeted by this assay, and inadequate number of viral copies  (<250 copies / mL). A negative result must be combined with  clinical  observations, patient history, and epidemiological information. If result is POSITIVE SARS-CoV-2 target nucleic acids are DETECTED. The SARS-CoV-2 RNA is generally detectable in upper and lower  respiratory specimens dur ing the acute phase of infection.  Positive  results are indicative of active infection with SARS-CoV-2.  Clinical  correlation with patient history and other diagnostic information is  necessary to determine patient infection status.  Positive results do  not rule out bacterial infection or co-infection with other viruses. If result is PRESUMPTIVE POSTIVE SARS-CoV-2 nucleic acids MAY BE PRESENT.   A presumptive positive result was obtained on the submitted specimen  and confirmed on repeat testing.  While 2019 novel coronavirus  (SARS-CoV-2) nucleic acids may be present in the submitted sample  additional confirmatory testing may be necessary for epidemiological  and / or clinical management purposes  to differentiate between  SARS-CoV-2 and other Sarbecovirus currently known to infect humans.  If clinically indicated additional testing with an alternate test  methodology [phone]) is advised. The SARS-CoV-2 RNA is generally  detectable in upper and lower respiratory sp ecimens during the acute  phase of infection. The expected result is Negative. Fact Sheet for Patients:  StrictlyIdeas.no Fact Sheet for Healthcare Providers: BankingDealers.co.za This test is not yet approved or cleared by the [place] FDA and has been authorized for detection and/or diagnosis of SARS-CoV-2 by FDA under an Emergency Use Authorization (EUA).  This EUA will remain in effect (meaning this test can be used) for the duration of the COVID-19 declaration under Section 564(b)(1) of the Act, 21 U.S.C. section 360bbb-3(b)(1), unless the authorization is terminated or revoked sooner. Performed at [facility], [address],  [place], [place] [address]   MRSA PCR Screening     Status: None   Collection Time: 04/02/19 11:22 PM  Result  Value Ref Range Status   MRSA by PCR NEGATIVE NEGATIVE Final    Comment:        The GeneXpert MRSA Assay (FDA approved for NASAL specimens only), is one component of a comprehensive MRSA colonization surveillance program. It is not intended to diagnose MRSA infection nor to guide or monitor treatment for MRSA infections. Performed at [facility], [address], [place], [place] [address]      Time coordinating discharge: 45 minutes  SIGNED:   Tawni Millers, MD  [facility] 04/05/2019, 9:31 AM

## 2019-04-09 ENCOUNTER — Other Ambulatory Visit: Payer: Self-pay | Admitting: *Deleted

## 2019-04-09 NOTE — Patient Outreach (Signed)
Referral received from inpatient RN hospital on 04/03/19, pt hospitalized 5/19-5/22/20 with DKA, pt with history diabetes, PTSD, major depressive disorder, fibromyalgia, pain, morbid obesity, AIC 04/01/19-13.7.  Primary MD office completes transition of care.  Outreach call to pt at [phone]  No answer to telephone, left voicemail requesting return phone call,  RN CM mailed unsuccessful outreach letter to pt home.  PLAN Outreach pt in 3-4 business days  Irving Shows [facility], BSN [facility] Care Coordinator [phone]

## 2019-04-11 NOTE — Progress Notes (Signed)
BP 118/74   Pulse 75   Temp (!) 97.2 F (36.2 C) (Oral)   Ht '5\' 7"'  (1.702 m)   Wt 294 lb 9.6 oz (133.6 kg)   BMI 46.14 kg/m    Subjective:    Patient ID: Cynthia Espinoza, female    DOB: December 24, 1955, 63 y.o.   MRN: [id number]  HPI: Cynthia Espinoza is a 63 y.o. female presenting on 04/12/2019 for Hospitalization Follow-up  Patient is having a hospital follow-up is been 7 days since she was discharged from any pain hospital.  She was admitted for hyperglycemia, and acute kidney injury.  Reports that overall she is doing much better.  TH and chronic care management has been getting in touch with her.  And they are going to be working with her on try to get her diabetes under very good control.  She is very motivated to get this worked on.  The readings that she shows me have been a very good improvement.  She has made a dramatic change in her diet and is wanting to start walking.  But she is just quite tired at this time.  Of encouraged her just to start with 5 or 10-minute walks every day and build up each week.  Information from her discharge summary is as follows:  1.  Diabetes ketoacidosis.  Patient was admitted to the stepdown unit, she was placed on intravenous fluids and intravenous insulin per protocol, her anion gap closed, her diet was advanced, and she was successfully transitioned to subcutaneous insulin.  She has significant insulin resistance and required high doses of insulin.  Her electrolytes were corrected with potassium chloride, her discharge anion gap is 10. A1c was 13.7 Glucose 65 - 99 mg/dL 861High Panic     Comment:         Client Requested Flag  Results confirmed on  dilution.   BUN 8 - 27 mg/dL 25    Creatinine, Ser 0.57 - 1.00 mg/dL 1.15High     GFR calc non Af Amer >59 mL/min/1.73 52Low     GFR calc Af Amer >59 mL/min/1.73 60        2.  Type 2 diabetes mellitus.  Patient has chronic uncontrolled hyperglycemia, her hemoglobin A1c in May 18,2020 was  13.7.  She showed significant insulin resistance, her insulin dose has been daily modified, today she will be discharged on Insulin 70/30 ( in order her to afford treatment),   40 units twice daily, with capillary glucose monitoring before meals.    Follow-up within 7 days, if persistent hyperglycemia to consider adding metformin.  Patient had diabetic teaching, and lifestyle modification counseling.  Her fasting glucose at discharge is 235 mg/dl.   3.  Acute kidney injury with hypokalemia.  Hypovolemic related acute kidney injury, improved with intravenous fluids, potassium was corrected with potassium chloride, her discharge creatinine is 0.64.  4.  Hypertension.  Blood pressure control with amlodipine and benazepril.  5.  Hypothyroid.  Continue levothyroxine.  6.  Anxiety.  On alprazolam.     Past Medical History:  Diagnosis Date  . Anxiety   . Depression   . Hypertension   . Thyroid disease    Relevant past medical, surgical, family and social history reviewed and updated as indicated. Interim medical history since our last visit reviewed. Allergies and medications reviewed and updated. DATA REVIEWED: CHART IN EPIC  Family History reviewed for pertinent findings.  Review of Systems  Constitutional: Negative.   HENT: Negative.  Eyes: Negative.   Respiratory: Negative.   Gastrointestinal: Negative.   Genitourinary: Negative.     Allergies as of 04/12/2019      Reactions   Asa [aspirin] Rash   Penicillins Rash   Did it involve swelling of the face/tongue/throat, SOB, or low BP? Yes Did it involve sudden or severe rash/hives, skin peeling, or any reaction on the inside of your mouth or nose? Yes Did you need to seek medical attention at a hospital or doctor's office? Yes When did it last happen?Over 10 years ago If all above answers are "NO", may proceed with cephalosporin use.      Medication List       Accurate as of Apr 12, 2019 11:59 PM. If you have any  questions, ask your nurse or doctor.        ALPRAZolam 0.5 MG tablet Commonly known as:  XANAX Take 1 tablet (0.5 mg total) by mouth 2 (two) times daily as needed for anxiety.   amLODipine 10 MG tablet Commonly known as:  NORVASC Take 1 tablet (10 mg total) by mouth daily. What changed:  See the new instructions. Changed by:  Terald Sleeper, PA-C   benazepril 40 MG tablet Commonly known as:  LOTENSIN Take 1 tablet (40 mg total) by mouth daily. What changed:  See the new instructions. Changed by:  Terald Sleeper, PA-C   blood glucose meter kit and supplies Dispense based on patient and insurance preference. Use up to four times daily as directed. (FOR ICD-10 E10.9, E11.9).   hydrochlorothiazide 25 MG tablet Commonly known as:  HYDRODIURIL Take 1 tablet (25 mg total) by mouth daily. What changed:  See the new instructions. Changed by:  Terald Sleeper, PA-C   insulin NPH-regular Human (70-30) 100 UNIT/ML injection Commonly known as:  NovoLIN 70/30 ReliOn Inject 15-30 Units into the skin 2 (two) times daily with a meal for 30 days. What changed:  how much to take Changed by:  Terald Sleeper, PA-C   Insulin Pen Needle 29G X 5MM Misc 1 Units by Does not apply route 2 (two) times a day. Insulin needles for relion insulin pen.   levothyroxine 200 MCG tablet Commonly known as:  SYNTHROID Take 1 tablet (200 mcg total) by mouth daily before breakfast.   nystatin 100000 UNIT/ML suspension Commonly known as:  MYCOSTATIN Use as directed 5 mLs (500,000 Units total) in the mouth or throat 4 (four) times daily for 10 days. Swish and swallow.          Objective:    BP 118/74   Pulse 75   Temp (!) 97.2 F (36.2 C) (Oral)   Ht '5\' 7"'  (1.702 m)   Wt 294 lb 9.6 oz (133.6 kg)   BMI 46.14 kg/m   Allergies  Allergen Reactions  . Asa [Aspirin] Rash  . Penicillins Rash    Did it involve swelling of the face/tongue/throat, SOB, or low BP? Yes Did it involve sudden or severe  rash/hives, skin peeling, or any reaction on the inside of your mouth or nose? Yes Did you need to seek medical attention at a hospital or doctor's office? Yes When did it last happen?Over 10 years ago If all above answers are "NO", may proceed with cephalosporin use.     Wt Readings from Last 3 Encounters:  04/12/19 294 lb 9.6 oz (133.6 kg)  04/12/19 287 lb (130.2 kg)  04/02/19 284 lb 9.8 oz (129.1 kg)    Physical Exam Constitutional:  Appearance: She is well-developed.  HENT:     Head: Normocephalic and atraumatic.  Eyes:     Conjunctiva/sclera: Conjunctivae normal.     Pupils: Pupils are equal, round, and reactive to light.  Cardiovascular:     Rate and Rhythm: Normal rate and regular rhythm.     Heart sounds: Normal heart sounds.  Pulmonary:     Effort: Pulmonary effort is normal.     Breath sounds: Normal breath sounds.  Abdominal:     General: Bowel sounds are normal.     Palpations: Abdomen is soft.  Skin:    General: Skin is warm and dry.     Findings: No rash.  Neurological:     Mental Status: She is alert and oriented to person, place, and time.     Deep Tendon Reflexes: Reflexes are normal and symmetric.  Psychiatric:        Behavior: Behavior normal.        Thought Content: Thought content normal.        Judgment: Judgment normal.     Results for orders placed or performed in visit on 04/12/19  CMP14+EGFR  Result Value Ref Range   Glucose 196 (H) 65 - 99 mg/dL   BUN 12 8 - 27 mg/dL   Creatinine, Ser 0.80 0.57 - 1.00 mg/dL   GFR calc non Af Amer 80 >59 mL/min/1.73   GFR calc Af Amer 93 >59 mL/min/1.73   BUN/Creatinine Ratio 15 12 - 28   Sodium 137 134 - 144 mmol/L   Potassium 3.6 3.5 - 5.2 mmol/L   Chloride 98 96 - 106 mmol/L   CO2 22 20 - 29 mmol/L   Calcium 9.6 8.7 - 10.3 mg/dL   Total Protein 6.8 6.0 - 8.5 g/dL   Albumin 4.1 3.8 - 4.9 g/dL   Globulin, Total 2.7 1.5 - 4.5 g/dL   Albumin/Globulin Ratio 1.5 1.2 - 2.2   Bilirubin Total 0.4  0.0 - 1.2 mg/dL   Alkaline Phosphatase 51 39 - 117 IU/L   AST 55 (H) 0 - 40 IU/L   ALT 72 (H) 0 - 32 IU/L  CBC with Differential/Platelet  Result Value Ref Range   WBC 7.9 3.4 - 10.8 x10E3/uL   RBC 4.47 3.77 - 5.28 x10E6/uL   Hemoglobin 14.1 11.1 - 15.9 g/dL   Hematocrit 40.9 34.0 - 46.6 %   MCV 92 79 - 97 fL   MCH 31.5 26.6 - 33.0 pg   MCHC 34.5 31.5 - 35.7 g/dL   RDW 12.8 11.7 - 15.4 %   Platelets 244 150 - 450 x10E3/uL   Neutrophils 58 Not Estab. %   Lymphs 27 Not Estab. %   Monocytes 11 Not Estab. %   Eos 3 Not Estab. %   Basos 1 Not Estab. %   Neutrophils Absolute 4.7 1.4 - 7.0 x10E3/uL   Lymphocytes Absolute 2.1 0.7 - 3.1 x10E3/uL   Monocytes Absolute 0.9 0.1 - 0.9 x10E3/uL   EOS (ABSOLUTE) 0.2 0.0 - 0.4 x10E3/uL   Basophils Absolute 0.1 0.0 - 0.2 x10E3/uL   Immature Granulocytes 0 Not Estab. %   Immature Grans (Abs) 0.0 0.0 - 0.1 x10E3/uL  TSH  Result Value Ref Range   TSH 3.270 0.450 - 4.500 uIU/mL      Assessment & Plan:   1. Diabetic ketoacidosis without coma associated with type 2 diabetes mellitus ([place]) - CMP14+EGFR - CBC with Differential/Platelet  2. AKI (acute kidney injury) ([place]) - CMP14+EGFR - CBC with Differential/Platelet  3. Essential hypertension - amLODipine (NORVASC) 10 MG tablet; Take 1 tablet (10 mg total) by mouth daily.  Dispense: 90 tablet; Refill: 1 - benazepril (LOTENSIN) 40 MG tablet; Take 1 tablet (40 mg total) by mouth daily.  Dispense: 90 tablet; Refill: 1 - hydrochlorothiazide (HYDRODIURIL) 25 MG tablet; Take 1 tablet (25 mg total) by mouth daily.  Dispense: 90 tablet; Refill: 1  4. Hypothyroidism, unspecified type - TSH - levothyroxine (SYNTHROID) 200 MCG tablet; Take 1 tablet (200 mcg total) by mouth daily before breakfast.  Dispense: 90 tablet; Refill: 1  5. Recurrent major depressive disorder, in partial remission (HCC) - ALPRAZolam (XANAX) 0.5 MG tablet; Take 1 tablet (0.5 mg total) by mouth 2 (two) times daily as needed for  anxiety.  Dispense: 180 tablet; Refill: 1   Continue all other maintenance medications as listed above.  Follow up plan: Return in about 6 weeks (around 05/24/2019) for recheck.  Educational handout given for carb counting  Terald Sleeper PA-C [place] [address]  [place], [place] [address] [phone]   04/16/2019, 10:16 PM

## 2019-04-12 ENCOUNTER — Other Ambulatory Visit: Payer: Self-pay

## 2019-04-12 ENCOUNTER — Encounter: Payer: Self-pay | Admitting: Physician Assistant

## 2019-04-12 ENCOUNTER — Ambulatory Visit: Payer: BLUE CROSS/BLUE SHIELD | Admitting: Physician Assistant

## 2019-04-12 ENCOUNTER — Encounter: Payer: Self-pay | Admitting: *Deleted

## 2019-04-12 ENCOUNTER — Other Ambulatory Visit: Payer: Self-pay | Admitting: *Deleted

## 2019-04-12 VITALS — BP 118/74 | HR 75 | Temp 97.2°F | Ht 67.0 in | Wt 294.6 lb

## 2019-04-12 DIAGNOSIS — E039 Hypothyroidism, unspecified: Secondary | ICD-10-CM

## 2019-04-12 DIAGNOSIS — E1110 Type 2 diabetes mellitus with ketoacidosis without coma: Secondary | ICD-10-CM | POA: Diagnosis not present

## 2019-04-12 DIAGNOSIS — I10 Essential (primary) hypertension: Secondary | ICD-10-CM

## 2019-04-12 DIAGNOSIS — N179 Acute kidney failure, unspecified: Secondary | ICD-10-CM

## 2019-04-12 DIAGNOSIS — F3341 Major depressive disorder, recurrent, in partial remission: Secondary | ICD-10-CM

## 2019-04-12 MED ORDER — AMLODIPINE BESYLATE 10 MG PO TABS: 10.0000 mg | ORAL_TABLET | Freq: Every day | ORAL | 1 refills | Status: DC

## 2019-04-12 MED ORDER — LEVOTHYROXINE SODIUM 200 MCG PO TABS: 200.0000 ug | ORAL_TABLET | Freq: Every day | ORAL | 1 refills | Status: DC

## 2019-04-12 MED ORDER — INSULIN NPH ISOPHANE & REGULAR (70-30) 100 UNIT/ML ~~LOC~~ SUSP: 15.0000 [IU] | Freq: Two times a day (BID) | SUBCUTANEOUS | 5 refills | Status: DC

## 2019-04-12 MED ORDER — BENAZEPRIL HCL 40 MG PO TABS: 40.0000 mg | ORAL_TABLET | Freq: Every day | ORAL | 1 refills | Status: DC

## 2019-04-12 MED ORDER — ALPRAZOLAM 0.5 MG PO TABS: 0.5000 mg | ORAL_TABLET | Freq: Two times a day (BID) | ORAL | 1 refills | Status: DC | PRN

## 2019-04-12 MED ORDER — HYDROCHLOROTHIAZIDE 25 MG PO TABS: 25.0000 mg | ORAL_TABLET | Freq: Every day | ORAL | 1 refills | Status: DC

## 2019-04-12 NOTE — Patient Outreach (Signed)
Outreach call for screening/  2nd attempt, pt with diagnosis Type 2 DM uncontrolled with AIC 13.7 on 04/01/19, AKI, PTSD, depression, hypothyroidism, fibromyalgia, morbid obesity, hospitalized 04/02/19-04/05/19 with DKA.  Spoke with pt, HIPAA verified, pt reports she is to see primary care provider this afternoon, pt states she is staying at her son's house at present as she lives alone.  Pt states she is checking CBG BID with 202 FBS today and 148 RBS yesterday evening.  RN CM reviewed importance of good blood sugar control, pt has pain almost daily with fibromyalgia and states she has tried " a lot of different things and nothing really helps so I don't really take any strong pain meds"  Pt is trying to reduce stress and relax more.  RNCM informed pt there is CSW at her primary care provider office if pt is interested for counseling/ resources, pt states she may talk with her provider about this today, pt states she previously went to outpatient to see psychiatrist and " I may start doing that again"  Pt states she is on xanax and this does help with her anxiety.  RN Cm urged pt to schedule eye exam and she states she has never had an eye exam. Pt states she will schedule an appointment. RN CM faxed copy of today's note and barrier letter to primary care provider.  Mailed successful outreach letter to pt home, gave pt RN CM contact #.    Outpatient Encounter Medications as of 04/12/2019  Medication Sig Note  . ALPRAZolam (XANAX) 0.5 MG tablet Take 1 tablet (0.5 mg total) by mouth 2 (two) times daily as needed for anxiety.   [name] Kitchen amLODipine (NORVASC) 10 MG tablet TAKE ONE (1) TABLET EACH DAY (Patient taking differently: Take 10 mg by mouth daily. )   . benazepril (LOTENSIN) 40 MG tablet TAKE ONE (1) TABLET EACH DAY (Patient taking differently: Take 40 mg by mouth daily. )   . blood glucose meter kit and supplies Dispense based on patient and insurance preference. Use up to four times daily as directed. (FOR ICD-10  E10.9, E11.9).   . hydrochlorothiazide (HYDRODIURIL) 25 MG tablet TAKE ONE (1) TABLET EACH DAY (Patient taking differently: Take 25 mg by mouth daily. )   . insulin NPH-regular Human (NOVOLIN 70/30 RELION) (70-30) 100 UNIT/ML injection Inject 40 Units into the skin 2 (two) times daily with a meal for 30 days. 04/12/2019: Pt state she is not taking 40 units BID, but is using sliding scale.  . Insulin Pen Needle 29G X 5MM MISC 1 Units by Does not apply route 2 (two) times a day. Insulin needles for relion insulin pen.   . levothyroxine (SYNTHROID, LEVOTHROID) 200 MCG tablet TAKE ONE TABLET EACH MORNING BEFORE BREAKFAST (Patient taking differently: Take 200 mcg by mouth daily before breakfast. )   . nystatin (MYCOSTATIN) 100000 UNIT/ML suspension Use as directed 5 mLs (500,000 Units total) in the mouth or throat 4 (four) times daily for 10 days. Swish and swallow.    No facility-administered encounter medications on file as of 04/12/2019.     THN CM Care Plan Problem One     Most Recent Value  Care Plan Problem One  Knowledge deficit related to diabetes  Role Documenting the Problem One  Care Management Brave for Problem One  Active  THN Long Term Goal   Pt will verbalize/ demonstrate improved self care for diabetes aeb decrease in AIC by 1-2 points within 90 days  [facility]  Long Term Goal Start Date  04/12/19  Interventions for Problem One Long Term Goal  RN CM reviewed plan of care with pt, mailed successful outreach letter with consent form, diabetes booklet and ask pt to read through, pt is to see primary care MD today  [facility] CM Short Term Goal #1   Pt will talk with primary care provider about insulin dosage and how many times daily to check CBG within one week.  THN CM Short Term Goal #1 Start Date  04/12/19  Interventions for Short Term Goal #1  RNCM reviewed discharge summary/ medications and states pt to take insulin NPH 40 units BID, pt is taking 10 units plus sliding scale citing  she did not know this and does not have DC summary with her, pt is at her son's house,  RN CM faxed barrier letter to primary MD office and reported dosage of insulin pt is taking and ask for clairification  THN CM Short Term Goal #2   Pt will verbalize plate method and how many carbohydrates allowed at each meal within 30 days  THN CM Short Term Goal #2 Start Date  04/12/19  Interventions for Short Term Goal #2  RN CM reviewed diabetic/ carbohydrate modified diet, plate method     PLAN Outreach pt next week for follow up after MD visit 04/12/19 Verify insulin dosage Assess CBG  Jacqlyn Larsen [facility], [place] Coordinator [phone]

## 2019-04-12 NOTE — Patient Instructions (Signed)
Carbohydrate Counting for Diabetes Mellitus, Adult  Carbohydrate counting is a method of keeping track of how many carbohydrates you eat. Eating carbohydrates naturally increases the amount of sugar (glucose) in the blood. Counting how many carbohydrates you eat helps keep your blood glucose within normal limits, which helps you manage your diabetes (diabetes mellitus). It is important to know how many carbohydrates you can safely have in each meal. This is different for every person. A diet and nutrition specialist (registered dietitian) can help you make a meal plan and calculate how many carbohydrates you should have at each meal and snack. Carbohydrates are found in the following foods:  Grains, such as breads and cereals.  Dried beans and soy products.  Starchy vegetables, such as potatoes, peas, and corn.  Fruit and fruit juices.  Milk and yogurt.  Sweets and snack foods, such as cake, cookies, candy, chips, and soft drinks. How do I count carbohydrates? There are two ways to count carbohydrates in food. You can use either of the methods or a combination of both. Reading "Nutrition Facts" on packaged food The "Nutrition Facts" list is included on the labels of almost all packaged foods and beverages in the [place] It includes:  The serving size.  Information about nutrients in each serving, including the grams (g) of carbohydrate per serving. To use the "Nutrition Facts":  Decide how many servings you will have.  Multiply the number of servings by the number of carbohydrates per serving.  The resulting number is the total amount of carbohydrates that you will be having. Learning standard serving sizes of other foods When you eat carbohydrate foods that are not packaged or do not include "Nutrition Facts" on the label, you need to measure the servings in order to count the amount of carbohydrates:  Measure the foods that you will eat with a food scale or measuring cup, if needed.   Decide how many standard-size servings you will eat.  Multiply the number of servings by 15. Most carbohydrate-rich foods have about 15 g of carbohydrates per serving. ? For example, if you eat 8 oz (170 g) of strawberries, you will have eaten 2 servings and 30 g of carbohydrates (2 servings x 15 g = 30 g).  For foods that have more than one food mixed, such as soups and casseroles, you must count the carbohydrates in each food that is included. The following list contains standard serving sizes of common carbohydrate-rich foods. Each of these servings has about 15 g of carbohydrates:   hamburger bun or  English muffin.   oz (15 mL) syrup.   oz (14 g) jelly.  1 slice of bread.  1 six-inch tortilla.  3 oz (85 g) cooked rice or pasta.  4 oz (113 g) cooked dried beans.  4 oz (113 g) starchy vegetable, such as peas, corn, or potatoes.  4 oz (113 g) hot cereal.  4 oz (113 g) mashed potatoes or  of a large baked potato.  4 oz (113 g) canned or frozen fruit.  4 oz (120 mL) fruit juice.  4-6 crackers.  6 chicken nuggets.  6 oz (170 g) unsweetened dry cereal.  6 oz (170 g) plain fat-free yogurt or yogurt sweetened with artificial sweeteners.  8 oz (240 mL) milk.  8 oz (170 g) fresh fruit or one small piece of fruit.  24 oz (680 g) popped popcorn. Example of carbohydrate counting Sample meal  3 oz (85 g) chicken breast.  6 oz (170 g)   brown rice.  4 oz (113 g) corn.  8 oz (240 mL) milk.  8 oz (170 g) strawberries with sugar-free whipped topping. Carbohydrate calculation 1. Identify the foods that contain carbohydrates: ? Rice. ? Corn. ? Milk. ? Strawberries. 2. Calculate how many servings you have of each food: ? 2 servings rice. ? 1 serving corn. ? 1 serving milk. ? 1 serving strawberries. 3. Multiply each number of servings by 15 g: ? 2 servings rice x 15 g = 30 g. ? 1 serving corn x 15 g = 15 g. ? 1 serving milk x 15 g = 15 g. ? 1 serving  strawberries x 15 g = 15 g. 4. Add together all of the amounts to find the total grams of carbohydrates eaten: ? 30 g + 15 g + 15 g + 15 g = 75 g of carbohydrates total. Summary  Carbohydrate counting is a method of keeping track of how many carbohydrates you eat.  Eating carbohydrates naturally increases the amount of sugar (glucose) in the blood.  Counting how many carbohydrates you eat helps keep your blood glucose within normal limits, which helps you manage your diabetes.  A diet and nutrition specialist (registered dietitian) can help you make a meal plan and calculate how many carbohydrates you should have at each meal and snack. This information is not intended to replace advice given to you by your health care provider. Make sure you discuss any questions you have with your health care provider. Document Released: 10/31/2005 Document Revised: 05/10/2017 Document Reviewed: 04/13/2016 Elsevier Interactive Patient Education  2019 Elsevier Inc.  

## 2019-04-13 LAB — CMP14+EGFR
ALT: 72 IU/L — ABNORMAL HIGH (ref 0–32)
AST: 55 IU/L — ABNORMAL HIGH (ref 0–40)
Albumin/Globulin Ratio: 1.5 (ref 1.2–2.2)
Albumin: 4.1 g/dL (ref 3.8–4.9)
Alkaline Phosphatase: 51 IU/L (ref 39–117)
BUN/Creatinine Ratio: 15 (ref 12–28)
BUN: 12 mg/dL (ref 8–27)
Bilirubin Total: 0.4 mg/dL (ref 0.0–1.2)
CO2: 22 mmol/L (ref 20–29)
Calcium: 9.6 mg/dL (ref 8.7–10.3)
Chloride: 98 mmol/L (ref 96–106)
Creatinine, Ser: 0.8 mg/dL (ref 0.57–1.00)
GFR calc Af Amer: 93 mL/min/{1.73_m2} (ref >59)
GFR calc non Af Amer: 80 mL/min/{1.73_m2} (ref >59)
Globulin, Total: 2.7 g/dL (ref 1.5–4.5)
Glucose: 196 mg/dL — ABNORMAL HIGH (ref 65–99)
Potassium: 3.6 mmol/L (ref 3.5–5.2)
Sodium: 137 mmol/L (ref 134–144)
Total Protein: 6.8 g/dL (ref 6.0–8.5)

## 2019-04-13 LAB — CBC WITH DIFFERENTIAL/PLATELET
Basophils Absolute: 0.1 10*3/uL (ref 0.0–0.2)
Basos: 1 %
EOS (ABSOLUTE): 0.2 10*3/uL (ref 0.0–0.4)
Eos: 3 %
Hematocrit: 40.9 % (ref 34.0–46.6)
Hemoglobin: 14.1 g/dL (ref 11.1–15.9)
Immature Grans (Abs): 0 10*3/uL (ref 0.0–0.1)
Immature Granulocytes: 0 %
Lymphocytes Absolute: 2.1 10*3/uL (ref 0.7–3.1)
Lymphs: 27 %
MCH: 31.5 pg (ref 26.6–33.0)
MCHC: 34.5 g/dL (ref 31.5–35.7)
MCV: 92 fL (ref 79–97)
Monocytes Absolute: 0.9 10*3/uL (ref 0.1–0.9)
Monocytes: 11 %
Neutrophils Absolute: 4.7 10*3/uL (ref 1.4–7.0)
Neutrophils: 58 %
Platelets: 244 10*3/uL (ref 150–450)
RBC: 4.47 x10E6/uL (ref 3.77–5.28)
RDW: 12.8 % (ref 11.7–15.4)
WBC: 7.9 10*3/uL (ref 3.4–10.8)

## 2019-04-13 LAB — TSH: TSH: 3.27 u[IU]/mL (ref 0.450–4.500)

## 2019-04-18 ENCOUNTER — Other Ambulatory Visit: Payer: Self-pay | Admitting: *Deleted

## 2019-04-18 NOTE — Patient Outreach (Signed)
Outreach call to pt for follow up on CBG readings, no answer to telephone, left voicemail requesting return phone call, RN CM mailed unsuccessful outreach letter to pt home.  PLAN Outreach pt in 3-4 business days  Irving Shows [facility], BSN [facility] Care Coordinator [phone]

## 2019-04-23 ENCOUNTER — Other Ambulatory Visit: Payer: Self-pay | Admitting: *Deleted

## 2019-04-23 NOTE — Patient Outreach (Addendum)
Outreach call to pt (2nd attempt) for follow up on CBG readings, no answer to telephone, left voicemail requesting return phone call. Pt called back later and left voicemail,  RN CM tried to call pt back again with still no answer to telephone.  PLAN Outreach pt in 3-4 business days  Jacqlyn Larsen [facility], [place] [phone]

## 2019-04-26 ENCOUNTER — Other Ambulatory Visit: Payer: Self-pay | Admitting: *Deleted

## 2019-04-26 ENCOUNTER — Encounter: Payer: Self-pay | Admitting: *Deleted

## 2019-04-26 NOTE — Patient Outreach (Signed)
Outreach call to pt for telephone assessment, spoke with pt, HIPAA verified, pt reports last few readings fasting are 161, 169, 173, 166 and recent random readings are 134, 114, pt states " this is so much better than before and I"m trying to eat better and walk twice daily"  Pt states she is paying 17$/ monthly for glucometer strips from [place] for  Reli On meter and states she is going to contact [facility] to inquire if there are any strips she can get that will be fully reimbursed by [employer],  Pt will update RN CM on this in the event pharmacy referral may need to be placed in the future.  Pt feels getting outside and walking twice daily has helped her tremendously.  RN CM praised and encouraged pt for her efforts towards better health.  THN CM Care Plan Problem One     Most Recent Value  Care Plan Problem One  Knowledge deficit related to diabetes  Role Documenting the Problem One  Care Management Coordinator  Care Plan for Problem One  Active  THN Long Term Goal   Pt will verbalize/ demonstrate improved self care for diabetes aeb decrease in AIC by 1-2 points within 90 days  THN Long Term Goal Start Date  04/12/19  Interventions for Problem One Long Term Goal  RN CM reinforced plan of care, pt states she did receive Diabetes booklet and has been looking through the booklet  THN CM Short Term Goal #1   Pt will talk with primary care provider about insulin dosage and how many times daily to check CBG within one week.  THN CM Short Term Goal #1 Start Date  04/12/19  [facility] CM Short Term Goal #1 Met Date  04/26/19  Interventions for Short Term Goal #1  Pt discussed insulin dosage with primary MD and is on sliding scale, verbalizes understanding  THN CM Short Term Goal #2   Pt will verbalize plate method and how many carbohydrates allowed at each meal within 30 days  THN CM Short Term Goal #2 Start Date  04/26/19 Bar[facility] Folk re-established]  Interventions for Short Term Goal #2  RN CM reinforced plate method and  food choices      PLAN Outreach pt for telephone assessment next month Assess CBG   Jacqlyn Larsen [facility], BSN [place] Coordinator [phone]

## 2019-05-09 ENCOUNTER — Telehealth: Payer: Self-pay | Admitting: Physician Assistant

## 2019-05-09 DIAGNOSIS — Z8659 Personal history of other mental and behavioral disorders: Secondary | ICD-10-CM

## 2019-05-09 DIAGNOSIS — F3341 Major depressive disorder, recurrent, in partial remission: Secondary | ICD-10-CM

## 2019-05-09 NOTE — Telephone Encounter (Signed)
Depression, anxiety, PTSD

## 2019-05-09 NOTE — Telephone Encounter (Signed)
We can place a referral with [facility] for CCM. What type of diagnosis should we use?

## 2019-05-10 ENCOUNTER — Telehealth: Payer: Self-pay | Admitting: Physician Assistant

## 2019-05-10 NOTE — Telephone Encounter (Signed)
Order placed and patient aware.

## 2019-05-15 ENCOUNTER — Ambulatory Visit: Payer: Self-pay | Admitting: Licensed Clinical Social Worker

## 2019-05-15 DIAGNOSIS — E039 Hypothyroidism, unspecified: Secondary | ICD-10-CM

## 2019-05-15 DIAGNOSIS — M797 Fibromyalgia: Secondary | ICD-10-CM

## 2019-05-15 DIAGNOSIS — F3341 Major depressive disorder, recurrent, in partial remission: Secondary | ICD-10-CM

## 2019-05-15 DIAGNOSIS — Z8659 Personal history of other mental and behavioral disorders: Secondary | ICD-10-CM

## 2019-05-15 DIAGNOSIS — I10 Essential (primary) hypertension: Secondary | ICD-10-CM

## 2019-05-15 DIAGNOSIS — E119 Type 2 diabetes mellitus without complications: Secondary | ICD-10-CM

## 2019-05-15 NOTE — Telephone Encounter (Signed)
Please see previous telephone call

## 2019-05-15 NOTE — Chronic Care Management (AMB) (Signed)
  Care Management Note   Cynthia Espinoza is a 63 y.o. year old female who is a primary care patient of Cynthia Sleeper, PA-C. The CM team was consulted for assistance with chronic disease management and care coordination.   I reached out to [name] by phone today.    Review of patient status, including review of consultants reports, relevant laboratory and other test results, and collaboration with appropriate care team members and the patient's provider was performed as part of comprehensive patient evaluation and provision of chronic care management services.   Social Determinants of Health: Risk for Depression;Risk for tobacco exposure    Chronic Care Management from 05/15/2019 in [place]  PHQ-9 Total Score  8     GAD 7 : Generalized Anxiety Score 05/15/2019  Nervous, Anxious, on Edge 2  Control/stop worrying 1  Worry too much - different things 1  Trouble relaxing 1  Restless 0  Easily annoyed or irritable 0  Afraid - awful might happen 1  Total GAD 7 Score 6  Anxiety Difficulty Somewhat difficult   Goals    . Client wants to talk with LCSW about depression/stress issues facing client (pt-stated)     Current Barriers:  . Pain issues from fibromyalgia . Financial Challenges . Has been working on [employer] but it is not approved yet. This is challenge area   Clinical Social Work Clinical Goal(s):  [name] Kitchen LCSW and client will talk in next 30 days about depression and stress issues faced by client  Interventions: . Provided patient with information about LCSW support  .  Talked about depression issues facing client  . Talked with client about barriers facing client . Talked with client about relaxation techniques of choice (watch TV, takes walks with use of walker, likes to talk with family or friends via phone) . Talked with client about PTSD and past treatment for PTSD.    Patient Self Care Activities:  . Self administers medications as  prescribed . Attends all scheduled provider appointments   Plan:  Client to attend scheduled medical appointments Client to use relaxation techniques of choice to help her manage depression symptoms LCSW to call client in next 3 weeks to talk with clientt about mangement of depression symptoms .  Initial goal documentation      Client said she is currently residing with her son. She has been in process of applying for Disability. She has law firm, [employer], representing her in [place] application. She has pain issus with fibromyalgia.  She said she gets sad, depressed from time to time. LCSW talked with client about relaxation techniques that help her manage depression symptoms. She said she has prescribed medications and is taking medications as prescribed . Client has medical appointment with Particia Nearing on 05/24/2019.  Client said she likes to read Bible, or read other religious literature as a way to relax. She is active at her church. LCSW and client spoke of PTSD issues for client.  Follow Up Plan:  LCSW to call client in next 3 weeks to talk with her about depression symptoms management for client   Norva Riffle.Tanveer Dobberstein MSW, LCSW Licensed Clinical Social Worker [facility] Care Management [phone]

## 2019-05-15 NOTE — Patient Instructions (Signed)
Licensed Clinical Social Worker Visit Information  Goals we discussed today:  Goals Addressed            This Visit's Progress   . Client wants to talk with LCSW about depression/stress issues facing client (pt-stated)       Current Barriers:  . Pain issues from fibromyalgia . Finances Challenges . Has been working on [employer] but it is not approved yet. This is challenge area   Clinical Social Work Clinical Goal(s):  [name] Kitchen LCSW and client will talk in next 30 days about depression and stress issuef faced by client  Interventions: . Provided patient with information about LCSW support  .  Talked about depression issues facing client  . Talked with client about barriers facing client . Talked with client about relaxation techniques of choice (watch TV, takes walks with use of walker, likes to talk with family or friends via phone) . Talked with client about PTSD and past treatment for PTSD.    Patient Self Care Activities:  . Self administers medications as prescribed . Attends all scheduled provider appointments   Plan:  Client to attend scheduled medical appointments Client to use relaxation techniques of choice to help her manage depression symptoms LCSW to call client in next 3 weeks to talk with client about management of depression symptoms .  Initial goal documentation        Materials Provided: No  Follow Up Plan: LCSW to call client in next 3 weeks to talk with client about management of  depression symptoms of client  The patient verbalized understanding of instructions provided today and declined a print copy of patient instruction materials.   Norva Riffle.Regis Hinton MSW, LCSW Licensed Clinical Social Worker [facility] Care Management [phone]

## 2019-05-22 ENCOUNTER — Other Ambulatory Visit: Payer: Self-pay

## 2019-05-24 ENCOUNTER — Other Ambulatory Visit: Payer: Self-pay

## 2019-05-24 ENCOUNTER — Encounter: Payer: Self-pay | Admitting: Physician Assistant

## 2019-05-24 ENCOUNTER — Ambulatory Visit: Payer: BLUE CROSS/BLUE SHIELD | Admitting: Physician Assistant

## 2019-05-24 VITALS — BP 119/69 | HR 77 | Temp 97.3°F | Ht 67.0 in | Wt 291.4 lb

## 2019-05-24 DIAGNOSIS — G8929 Other chronic pain: Secondary | ICD-10-CM

## 2019-05-24 DIAGNOSIS — M797 Fibromyalgia: Secondary | ICD-10-CM | POA: Diagnosis not present

## 2019-05-24 DIAGNOSIS — E1169 Type 2 diabetes mellitus with other specified complication: Secondary | ICD-10-CM

## 2019-05-24 DIAGNOSIS — I10 Essential (primary) hypertension: Secondary | ICD-10-CM

## 2019-05-24 DIAGNOSIS — E039 Hypothyroidism, unspecified: Secondary | ICD-10-CM | POA: Diagnosis not present

## 2019-05-24 DIAGNOSIS — M25561 Pain in right knee: Secondary | ICD-10-CM

## 2019-05-24 DIAGNOSIS — M25562 Pain in left knee: Secondary | ICD-10-CM

## 2019-05-24 MED ORDER — MELOXICAM 7.5 MG PO TABS: 7.5000 mg | ORAL_TABLET | Freq: Every day | ORAL | 5 refills | Status: DC

## 2019-05-24 MED ORDER — INSULIN PEN NEEDLE 29G X 5MM MISC: 1.0000 [IU] | Freq: Two times a day (BID) | 11 refills | Status: DC

## 2019-05-24 MED ORDER — DULOXETINE HCL 30 MG PO CPEP: 30.0000 mg | ORAL_CAPSULE | Freq: Every day | ORAL | 3 refills | Status: DC

## 2019-05-24 MED ORDER — CANAGLIFLOZIN 100 MG PO TABS: 100.0000 mg | ORAL_TABLET | Freq: Every day | ORAL | 5 refills | Status: DC

## 2019-05-24 MED ORDER — NOVOLIN 70/30 RELION (70-30) 100 UNIT/ML ~~LOC~~ SUSP: 15.0000 [IU] | Freq: Two times a day (BID) | SUBCUTANEOUS | 5 refills | Status: DC

## 2019-05-27 ENCOUNTER — Other Ambulatory Visit: Payer: Self-pay | Admitting: *Deleted

## 2019-05-27 ENCOUNTER — Telehealth: Payer: Self-pay | Admitting: Physician Assistant

## 2019-05-27 NOTE — Patient Outreach (Signed)
Outreach call to pt for telephone assessment, no answer to telephone, left voicemail requesting return phone call, mailed unsuccessful outreach letter to pt home.  PLAN Outreach pt in 3-4 business days  Jacqlyn Larsen [facility], [place] [phone]

## 2019-05-27 NOTE — Telephone Encounter (Signed)
Unfortunately, the note is not available for review.  According to the AVS, it would appear she wants her to take both.  Will cc to PCP for clarification

## 2019-05-27 NOTE — Telephone Encounter (Signed)
Yes, told her to continue the sliding scale insulin that she has been doing.  Add the Invokana and then the sliding scale insulin will titrate down as the pre-meal readings get lower.

## 2019-05-27 NOTE — Progress Notes (Signed)
BP 119/69   Pulse 77   Temp (!) 97.3 F (36.3 C) (Oral)   Ht _0  (1.702 m)   Wt 291 lb 6.4 oz (132.2 kg)   BMI 45.64 kg/m    Subjective:    Patient ID: Cynthia Espinoza, female    DOB: 1956/06/02, 63 y.o.   MRN: [id number]  HPI: Cynthia Espinoza is a 63 y.o. female presenting on 05/24/2019 for Diabetes (6 week )  This patient comes in for follow-up visit on all of her medical conditions.  She is still applying for disability all of this related to her severe arthritis, fibromyalgia, posttraumatic stress disorder with anxiety.  She also had diabetes diagnosis at a recent hospitalization.  Her A1c in May was 13.7.  We will draw it again in August.  Most of her fasting blood sugars over the past month have been quite good.  Ranging anywhere from 120-160.  She has had a few postprandial readings close to 200.  Overall she is doing an excellent job at watching her diet efforts and using the sliding scale insulin.  She still is unable to fully stand and walk and do long-term work.  She is trying to do a little bit of walking but only can does short distance in her yard.  She still quite weak from the hospitalization.  Because she continues with the severity in her joints and muscles.  Past Medical History:  Diagnosis Date  . Anxiety   . Depression   . Hypertension   . Thyroid disease    Relevant past medical, surgical, family and social history reviewed and updated as indicated. Interim medical history since our last visit reviewed. Allergies and medications reviewed and updated. DATA REVIEWED: CHART IN EPIC  Family History reviewed for pertinent findings.  Review of Systems  Constitutional: Negative.   HENT: Negative.   Eyes: Negative.   Respiratory: Negative.   Gastrointestinal: Negative.   Genitourinary: Negative.   Musculoskeletal: Positive for arthralgias, gait problem, joint swelling and myalgias.  Neurological: Positive for weakness.  Psychiatric/Behavioral: Positive for  decreased concentration. The patient is nervous/anxious.     Allergies as of 05/24/2019      Reactions   Metformin And Related    GI distress   Asa [aspirin] Rash   Penicillins Rash   Did it involve swelling of the face/tongue/throat, SOB, or low BP? Yes Did it involve sudden or severe rash/hives, skin peeling, or any reaction on the inside of your mouth or nose? Yes Did you need to seek medical attention at a hospital or doctor's office? Yes When did it last happen?Over 10 years ago If all above answers are "NO", may proceed with cephalosporin use.      Medication List       Accurate as of May 24, 2019 11:59 PM. If you have any questions, ask your nurse or doctor.        ALPRAZolam 0.5 MG tablet Commonly known as: XANAX Take 1 tablet (0.5 mg total) by mouth 2 (two) times daily as needed for anxiety.   amLODipine 10 MG tablet Commonly known as: NORVASC Take 1 tablet (10 mg total) by mouth daily.   benazepril 40 MG tablet Commonly known as: LOTENSIN Take 1 tablet (40 mg total) by mouth daily.   blood glucose meter kit and supplies Dispense based on patient and insurance preference. Use up to four times daily as directed. (FOR ICD-10 E10.9, E11.9).   canagliflozin 100 MG Tabs tablet  Commonly known as: Invokana Take 1 tablet (100 mg total) by mouth daily before breakfast. Started by: Terald Sleeper, PA-C   DULoxetine 30 MG capsule Commonly known as: Cymbalta Take 1 capsule (30 mg total) by mouth daily. Started by: Terald Sleeper, PA-C   hydrochlorothiazide 25 MG tablet Commonly known as: HYDRODIURIL Take 1 tablet (25 mg total) by mouth daily.   Insulin Pen Needle 29G X 5MM Misc 1 Units by Does not apply route 2 (two) times a day. Insulin needles for relion insulin pen.   levothyroxine 200 MCG tablet Commonly known as: SYNTHROID Take 1 tablet (200 mcg total) by mouth daily before breakfast.   meloxicam 7.5 MG tablet Commonly known as: MOBIC Take 1 tablet (7.5  mg total) by mouth daily. Started by: Terald Sleeper, PA-C   NovoLIN 70/30 ReliOn (70-30) 100 UNIT/ML injection Generic drug: insulin NPH-regular Human Inject 15-30 Units into the skin 2 (two) times daily with a meal.          Objective:    BP 119/69   Pulse 77   Temp (!) 97.3 F (36.3 C) (Oral)   Ht _0  (1.702 m)   Wt 291 lb 6.4 oz (132.2 kg)   BMI 45.64 kg/m   Allergies  Allergen Reactions  . Metformin And Related     GI distress   . Asa [Aspirin] Rash  . Penicillins Rash    Did it involve swelling of the face/tongue/throat, SOB, or low BP? Yes Did it involve sudden or severe rash/hives, skin peeling, or any reaction on the inside of your mouth or nose? Yes Did you need to seek medical attention at a hospital or doctor's office? Yes When did it last happen?Over 10 years ago If all above answers are "NO", may proceed with cephalosporin use.     Wt Readings from Last 3 Encounters:  05/24/19 291 lb 6.4 oz (132.2 kg)  04/12/19 294 lb 9.6 oz (133.6 kg)  04/12/19 287 lb (130.2 kg)    Physical Exam Constitutional:      Appearance: She is well-developed.  HENT:     Head: Normocephalic and atraumatic.  Eyes:     Conjunctiva/sclera: Conjunctivae normal.     Pupils: Pupils are equal, round, and reactive to light.  Cardiovascular:     Rate and Rhythm: Normal rate and regular rhythm.     Heart sounds: Normal heart sounds.  Pulmonary:     Effort: Pulmonary effort is normal.  Skin:    General: Skin is warm and dry.     Findings: No rash.  Neurological:     Mental Status: She is alert and oriented to person, place, and time.     Deep Tendon Reflexes: Reflexes are normal and symmetric.  Psychiatric:        Behavior: Behavior normal.     Results for orders placed or performed in visit on 04/12/19  CMP14+EGFR  Result Value Ref Range   Glucose 196 (H) 65 - 99 mg/dL   BUN 12 8 - 27 mg/dL   Creatinine, Ser 0.80 0.57 - 1.00 mg/dL   GFR calc non Af Amer 80 >59  mL/min/1.73   GFR calc Af Amer 93 >59 mL/min/1.73   BUN/Creatinine Ratio 15 12 - 28   Sodium 137 134 - 144 mmol/L   Potassium 3.6 3.5 - 5.2 mmol/L   Chloride 98 96 - 106 mmol/L   CO2 22 20 - 29 mmol/L   Calcium 9.6 8.7 - 10.3 mg/dL  Total Protein 6.8 6.0 - 8.5 g/dL   Albumin 4.1 3.8 - 4.9 g/dL   Globulin, Total 2.7 1.5 - 4.5 g/dL   Albumin/Globulin Ratio 1.5 1.2 - 2.2   Bilirubin Total 0.4 0.0 - 1.2 mg/dL   Alkaline Phosphatase 51 39 - 117 IU/L   AST 55 (H) 0 - 40 IU/L   ALT 72 (H) 0 - 32 IU/L  CBC with Differential/Platelet  Result Value Ref Range   WBC 7.9 3.4 - 10.8 x10E3/uL   RBC 4.47 3.77 - 5.28 x10E6/uL   Hemoglobin 14.1 11.1 - 15.9 g/dL   Hematocrit 40.9 34.0 - 46.6 %   MCV 92 79 - 97 fL   MCH 31.5 26.6 - 33.0 pg   MCHC 34.5 31.5 - 35.7 g/dL   RDW 12.8 11.7 - 15.4 %   Platelets 244 150 - 450 x10E3/uL   Neutrophils 58 Not Estab. %   Lymphs 27 Not Estab. %   Monocytes 11 Not Estab. %   Eos 3 Not Estab. %   Basos 1 Not Estab. %   Neutrophils Absolute 4.7 1.4 - 7.0 x10E3/uL   Lymphocytes Absolute 2.1 0.7 - 3.1 x10E3/uL   Monocytes Absolute 0.9 0.1 - 0.9 x10E3/uL   EOS (ABSOLUTE) 0.2 0.0 - 0.4 x10E3/uL   Basophils Absolute 0.1 0.0 - 0.2 x10E3/uL   Immature Granulocytes 0 Not Estab. %   Immature Grans (Abs) 0.0 0.0 - 0.1 x10E3/uL  TSH  Result Value Ref Range   TSH 3.270 0.450 - 4.500 uIU/mL      Assessment & Plan:   1. Type 2 diabetes mellitus with other specified complication, without long-term current use of insulin (HCC) - canagliflozin (INVOKANA) 100 MG TABS tablet; Take 1 tablet (100 mg total) by mouth daily before breakfast.  Dispense: 30 tablet; Refill: 5 - insulin NPH-regular Human (NOVOLIN 70/30 RELION) (70-30) 100 UNIT/ML injection; Inject 15-30 Units into the skin 2 (two) times daily with a meal.  Dispense: 18 mL; Refill: 5 - Insulin Pen Needle 29G X 5MM MISC; 1 Units by Does not apply route 2 (two) times a day. Insulin needles for relion insulin pen.   Dispense: 60 each; Refill: 11  2. Fibromyalgia - DULoxetine (CYMBALTA) 30 MG capsule; Take 1 capsule (30 mg total) by mouth daily.  Dispense: 30 capsule; Refill: 3  3. Hypothyroidism, unspecified type Continue medication  4. Essential hypertension Continue medicatio  5. Chronic pain of both knees - meloxicam (MOBIC) 7.5 MG tablet; Take 1 tablet (7.5 mg total) by mouth daily.  Dispense: 30 tablet; Refill: 5   Continue all other maintenance medications as listed above.  Follow up plan: Return in about 4 weeks (around 06/21/2019) for recheck medications and labs.  Educational handout given for [place] PA-C [facility] [address]  [place], [place] [address] [phone]   05/27/2019, 1:49 PM

## 2019-05-27 NOTE — Telephone Encounter (Signed)
Patient aware.

## 2019-05-28 NOTE — Telephone Encounter (Signed)
Patient aware.

## 2019-05-31 ENCOUNTER — Ambulatory Visit: Payer: Self-pay | Admitting: Licensed Clinical Social Worker

## 2019-05-31 ENCOUNTER — Other Ambulatory Visit: Payer: Self-pay | Admitting: *Deleted

## 2019-05-31 DIAGNOSIS — Z8659 Personal history of other mental and behavioral disorders: Secondary | ICD-10-CM

## 2019-05-31 DIAGNOSIS — G8929 Other chronic pain: Secondary | ICD-10-CM

## 2019-05-31 DIAGNOSIS — I10 Essential (primary) hypertension: Secondary | ICD-10-CM

## 2019-05-31 DIAGNOSIS — M797 Fibromyalgia: Secondary | ICD-10-CM

## 2019-05-31 DIAGNOSIS — E119 Type 2 diabetes mellitus without complications: Secondary | ICD-10-CM

## 2019-05-31 DIAGNOSIS — E1169 Type 2 diabetes mellitus with other specified complication: Secondary | ICD-10-CM

## 2019-05-31 DIAGNOSIS — E039 Hypothyroidism, unspecified: Secondary | ICD-10-CM

## 2019-05-31 NOTE — Patient Outreach (Signed)
Outreach call to pt for telephone assessment/ 2nd attempt, no answer to telephone and no option to leave voicemail.  PLAN Outreach pt in 3-4 business days  Julie Farmer RNC, BSN [facility] Community Care Coordinator [phone]   

## 2019-05-31 NOTE — Patient Instructions (Signed)
Licensed Clinical Social Worker Visit Information  Goals we discussed today:    Goals    . Client wants to talk with LCSW about depression/stress issues facing client (pt-stated)     Current Barriers:  . Pain issues from fibromyalgia . Finances Challenges . Has been working on [employer] but it is not approved yet. This is challenge area  Clinical Social Work Clinical Goal(s):  [name] Kitchen LCSW and client will talk in next 30 days about depression and stress issuef faced by client  Interventions:   .  Talked about depression issues facing client  . Talked with client about barriers facing client . Talked with client about relaxation techniques of choice (watch TV, takes walks with use of walker, likes to talk with family or friends via phone)  Patient Self Care Activities:  . Self administers medications as prescribed . Attends all scheduled provider appointments   Plan:  Client to attend scheduled medical appointments Client to use relaxation techniques of choice to help her manage depression symptoms LCSW to call client in next 3 weeks to talk with clinet about mangement of depression symptoms .  Initial goal documentation       Materials Provided: No  Follow Up Plan:  LCSW to call client in next 3 weeks to talk with client about depression symptoms management for client.  The patient verbalized understanding of instructions provided today and declined a print copy of patient instruction materials.   Norva Riffle.Nana Vastine MSW, LCSW Licensed Clinical Social Worker [facility] Care Management [phone]

## 2019-05-31 NOTE — Chronic Care Management (AMB) (Signed)
  Care Management Note   Cynthia Espinoza is a 63 y.o. year old female who is a primary care patient of Terald Sleeper, PA-C. The CM team was consulted for assistance with chronic disease management and care coordination.   I reached out to [name] by phone today.   Review of patient status, including review of consultants reports, relevant laboratory and other test results, and collaboration with appropriate care team members and the patient's provider was performed as part of comprehensive patient evaluation and provision of chronic care management services.   Social Determinants of Health: risk for tobacco exposure; risk for depression    Chronic Care Management from 05/15/2019 in [place]  PHQ-9 Total Score  8     GAD 7 : Generalized Anxiety Score 05/15/2019  Nervous, Anxious, on Edge 2  Control/stop worrying 1  Worry too much - different things 1  Trouble relaxing 1  Restless 0  Easily annoyed or irritable 0  Afraid - awful might happen 1  Total GAD 7 Score 6  Anxiety Difficulty Somewhat difficult    Goals    . Client wants to talk with LCSW about depression/stress issues facing client (pt-stated)     Current Barriers:  . Pain issues from fibromyalgia . Finances Challenges . Has been working on [employer] but it is not approved yet. This is challenge area  Clinical Social Work Clinical Goal(s):  [name] Kitchen LCSW and client will talk in next 30 days about depression and stress issuef faced by client  Interventions: .  Talked about depression issues facing client  . Talked with client about barriers facing client . Talked with client about relaxation techniques of choice (watch TV, takes walks with use of walker, likes to talk with family or friends via phone)  Patient Self Care Activities:  . Self administers medications as prescribed . Attends all scheduled provider appointments   Plan:  Client to attend scheduled medical appointments  Client to use relaxation techniques of choice to help her manage depression symptoms LCSW to call client in next 3 weeks to talk with clinet about mangement of depression symptoms .  Initial goal documentation      Client has prescribed medications and is taking medications as prescribed. She spoke of pain issues experienced. She had appointment with Particia Nearing PA-C on 05/24/2019.  She has been sleeping adequately. Client enjoys to walk to help her relax.  She enjoys spending time with her family members.  She said she is eating adequately.  She said she is trying to eat a healthy diet. Her family members help with transport needs of client.  LCSW gave client LCSW phone number and encouraged Cynthia Espinoza to call LCSW as needed to discuss social work needs of client  Follow Up Plan: LCSW to call client in next 3 weeks to talk with client about depression symptoms management of client  Norva Riffle.Viona Hosking MSW, LCSW Licensed Clinical Social Worker [facility] Care Management [phone]

## 2019-06-06 ENCOUNTER — Encounter: Payer: Self-pay | Admitting: *Deleted

## 2019-06-06 ENCOUNTER — Other Ambulatory Visit: Payer: Self-pay | Admitting: *Deleted

## 2019-06-06 NOTE — Patient Outreach (Signed)
Outreach call to pt for telephone assessment (3rd attempt), spoke with pt, HIPAA verified, pt reports she is doing well and blood sugar readings are much improved, pt states FBS ranges are all in low 100's (no higher than 145) and random readings are 87-114 range.  Pt states she saw primary care provider and the plan is to wean completely off insulin and stay on po meds only for diabetes management. Pt states she is off all sugary soft drinks and sweets, pt is trying to be mindful of everything she eats.  Pt states she is walking daily with a friend and this has helped her to feel better and have more energy. RN CM reviewed medications with pt.  RN CM praised and encouraged pt for her efforts towards making healthier lifestyle choices.  [facility] CM Care Plan Problem One     Most Recent Value  Care Plan Problem One  Knowledge deficit related to diabetes  Role Documenting the Problem One  Care Management Coordinator  Care Plan for Problem One  Active  [facility] Long Term Goal   Pt will verbalize/ demonstrate improved self care for diabetes aeb decrease in AIC by 1-2 points within 90 days  [facility] Long Term Goal Start Date  04/12/19  Interventions for Problem One Long Term Goal  RN CM reviewed plan of care with pt and will outreach pt next month after she sees primary care provider in August, pt goal with provider is to wean off insulin and be on po meds only  [facility] CM Short Term Goal #1   Pt will talk with primary care provider about insulin dosage and how many times daily to check CBG within one week.  [facility] CM Short Term Goal #1 Start Date  04/12/19  [facility] CM Short Term Goal #1 Met Date  04/26/19  [facility] CM Short Term Goal #2   Pt will verbalize plate method and how many carbohydrates allowed at each meal within 30 days  [facility] CM Short Term Goal #2 Start Date  06/06/19 Barrie Folk re-established  ongoing reinforcement]  Interventions for Short Term Goal #2  RN CM reviewed plate method and being mindful of carbohydrate intake, pt is  trying to stay around no more than 3-4 carb choices per meal.      PLAN Outreach pt next month by telephone after she her appointment with primary care  Jacqlyn Larsen [facility], [place] Coordinator [phone]

## 2019-06-21 ENCOUNTER — Ambulatory Visit: Payer: Self-pay | Admitting: Licensed Clinical Social Worker

## 2019-06-21 DIAGNOSIS — M25562 Pain in left knee: Secondary | ICD-10-CM

## 2019-06-21 DIAGNOSIS — E1169 Type 2 diabetes mellitus with other specified complication: Secondary | ICD-10-CM

## 2019-06-21 DIAGNOSIS — F3341 Major depressive disorder, recurrent, in partial remission: Secondary | ICD-10-CM

## 2019-06-21 DIAGNOSIS — Z8659 Personal history of other mental and behavioral disorders: Secondary | ICD-10-CM

## 2019-06-21 DIAGNOSIS — M797 Fibromyalgia: Secondary | ICD-10-CM

## 2019-06-21 DIAGNOSIS — I10 Essential (primary) hypertension: Secondary | ICD-10-CM

## 2019-06-21 DIAGNOSIS — G8929 Other chronic pain: Secondary | ICD-10-CM

## 2019-06-21 NOTE — Patient Instructions (Signed)
Licensed Clinical Social Worker Visit Information                    Licensed Clinical Social Worker Visit Information  Goals we discussed today:  Goals Addressed            This Visit's Progress   . Client wants to talk with LCSW about depression/stress issues facing client (pt-stated)       Current Barriers:  . Pain issues from fibromyalgia . Finances Challenges . Has been working on [employer] but it is not approved yet. This is challenge area   Clinical Social Work Clinical Goal(s):  [name] Kitchen LCSW and client will talk in next 30 days about depression and stress issues faced by client  Interventions: . Provided patient with information about LCSW support  .  Talked about depression issues facing client  . Talked with client about barriers facing client . Talked with client about relaxation techniques of choice (watch TV, takes walks with use of walker, likes to talk with family or friends via phone) . Talked with client about PTSD and past treatment for PTSD.    Patient Self Care Activities:  . Self administers medications as prescribed . Attends all scheduled provider appointments   Plan:  Client to attend scheduled medical appointments Client to use relaxation techniques of choice to help her manage depression symptoms LCSW to call client in next 3 weeks to talk with clinet about mangement of depression symptoms .  Initial goal documentation         Materials Provided:   Follow Up Plan: LCSW to call client in next 3 weeks to talk with client about management of depression symptoms of client  The patient verbalized understanding of instructions provided today and declined a print copy of patient instruction materials.   Norva Riffle.Keyra Virella MSW, LCSW Licensed Clinical Social Worker [facility] Care Management [phone]

## 2019-06-21 NOTE — Chronic Care Management (AMB) (Signed)
  Care Management Note   Cynthia Espinoza is a 63 y.o. year old female who is a primary care patient of Terald Sleeper, PA-C. The CM team was consulted for assistance with chronic disease management and care coordination.   I reached out to [name] by phone today.   Review of patient status, including review of consultants reports, relevant laboratory and other test results, and collaboration with appropriate care team members and the patient's provider was performed as part of comprehensive patient evaluation and provision of chronic care management services.   Social Determinants of Health:risk for tobacco use; risk of depression    Chronic Care Management from 05/15/2019 in [place]  PHQ-9 Total Score  8     GAD 7 : Generalized Anxiety Score 05/15/2019  Nervous, Anxious, on Edge 2  Control/stop worrying 1  Worry too much - different things 1  Trouble relaxing 1  Restless 0  Easily annoyed or irritable 0  Afraid - awful might happen 1  Total GAD 7 Score 6  Anxiety Difficulty Somewhat difficult    Goals Addressed            This Visit's Progress   . Client wants to talk with LCSW about depression/stress issues facing client (pt-stated)       Current Barriers:  . Pain issues from fibromyalgia . Finances Challenges . Has been working on [employer] but it is not approved yet. This is challenge area .  [name] Kitchen   Clinical Social Work Clinical Goal(s):  [name] Kitchen LCSW and client will talk in next 30 days about depression and stress issues faced by client  Interventions: . Previously provided patient with information about LCSW support  .  Previously talked with client  about depression issues facing client  . Previously talked with client about barriers facing client . Talked previously  with client about relaxation techniques of choice (watch TV, takes walks with use of walker, likes to talk with family or friends via phone) . Previously talked with  client about PTSD and past treatment for PTSD.    Patient Self Care Activities:  . Self administers medications as prescribed . Attends all scheduled provider appointments   Plan:  Client to attend scheduled medical appointments Client to use relaxation techniques of choice to help her manage depression symptoms LCSW to call client in next 3 weeks to talk with clinet about mangement of depression symptoms .  Initial goal documentation       Follow Up Plan: LCSW to call client in next 3 weeks to talk with client about management of depression symptoms.    Norva Riffle.Arling Cerone MSW, LCSW Licensed Clinical Social Worker [facility] [phone]

## 2019-06-25 ENCOUNTER — Ambulatory Visit: Payer: BC Managed Care – PPO | Admitting: Physician Assistant

## 2019-06-27 ENCOUNTER — Ambulatory Visit: Payer: BC Managed Care – PPO | Admitting: Physician Assistant

## 2019-06-28 ENCOUNTER — Ambulatory Visit: Payer: Self-pay | Admitting: Licensed Clinical Social Worker

## 2019-06-28 DIAGNOSIS — M25561 Pain in right knee: Secondary | ICD-10-CM

## 2019-06-28 DIAGNOSIS — G8929 Other chronic pain: Secondary | ICD-10-CM

## 2019-06-28 DIAGNOSIS — I10 Essential (primary) hypertension: Secondary | ICD-10-CM

## 2019-06-28 DIAGNOSIS — Z8659 Personal history of other mental and behavioral disorders: Secondary | ICD-10-CM

## 2019-06-28 DIAGNOSIS — E039 Hypothyroidism, unspecified: Secondary | ICD-10-CM

## 2019-06-28 DIAGNOSIS — F3341 Major depressive disorder, recurrent, in partial remission: Secondary | ICD-10-CM

## 2019-06-28 DIAGNOSIS — E119 Type 2 diabetes mellitus without complications: Secondary | ICD-10-CM

## 2019-06-28 DIAGNOSIS — E1169 Type 2 diabetes mellitus with other specified complication: Secondary | ICD-10-CM

## 2019-06-28 DIAGNOSIS — M797 Fibromyalgia: Secondary | ICD-10-CM

## 2019-06-28 NOTE — Chronic Care Management (AMB) (Signed)
  Care Management Note   Cynthia Espinoza is a 63 y.o. year old female who is a primary care patient of Cynthia Sleeper, PA-C. The CM team was consulted for assistance with chronic disease management and care coordination.   I reached out to Cynthia Espinoza by phone today.   Review of patient status, including review of consultants reports, relevant laboratory and other test results, and collaboration with appropriate care team members and the patient's provider was performed as part of comprehensive patient evaluation and provision of chronic care management services.   Social Determinants of Health: risk of tobacco use; risk for depression    Chronic Care Management from 05/15/2019 in [place]  PHQ-9 Total Score  8     GAD 7 : Generalized Anxiety Score 05/15/2019  Nervous, Anxious, on Edge 2  Control/stop worrying 1  Worry too much - different things 1  Trouble relaxing 1  Restless 0  Easily annoyed or irritable 0  Afraid - awful might happen 1  Total GAD 7 Score 6  Anxiety Difficulty Somewhat difficult   Client said she had her prescribed medications and is taking medications as prescribed. She said she has some difficulty sleeping, related to fibromyalgia.  She spoke of stress issues. She has a Disability Hearing via phone on 09/06/2019.  Cynthia Espinoza has appointment with Cynthia Nearing PA-C on 07/09/2019. She resides with her son and daughter in law. She said her family is supportive. Her family helps to transport client to and from client's medical appointments. She uses a walker to ambulate as needed. She said she does well with ADLs; she said her daughter in law helps her as needed with ADLs.  Client has potential for falls. She said sometimes her legs become weak.  She is using rollator walker to help her ambulate. She has a shower seat to use when bathing.  She said she is eating well. She spoke of financial challenges. She also likes to talk with relatives via phone to  help her relax.    Follow Up Plan: LCSW to call client in next 3 weeks to talk with client about client management of anxiety issues  Cynthia Espinoza.Cynthia Espinoza MSW, LCSW Licensed Clinical Social Worker [facility] [phone]

## 2019-06-28 NOTE — Patient Instructions (Addendum)
Licensed Clinical Social Worker Visit Information  Materials Provided: No  Client said she had her prescribed medications and is taking medications as prescribed. She said she has some difficulty sleeping, related to fibromyalgia.  She spoke of stress issues. She has a Disability Hearing via phone on 09/06/2019.  Cynthia Espinoza has appointment with Particia Nearing PA-C on 07/09/2019. She resides with her son and daughter in law. She said her family is supportive. Her family helps to transport client to and from client's medical appointments. She uses a walker to ambulate as needed. She said she does well with ADLs; she said her daughter in law helps her as needed with ADLs.  Client has potential for falls. She said sometimes her legs become weak.  She is using rollator walker to help her ambulate. She has a shower seat to use when bathing.  She said she is eating well. She spoke of financial challenges. She also likes to talk with relatives via phone to help her relax.    Follow Up Plan:LCSW to call client in next 3 weeks to talk with client about client management of anxiety symptoms   The patient verbalized understanding of instructions provided today and declined a print copy of patient instruction materials.   Norva Riffle.Rasheka Denard MSW, LCSW Licensed Clinical Social Worker [facility] [phone]

## 2019-07-02 ENCOUNTER — Ambulatory Visit: Payer: BC Managed Care – PPO | Admitting: Physician Assistant

## 2019-07-04 ENCOUNTER — Other Ambulatory Visit: Payer: Self-pay | Admitting: *Deleted

## 2019-07-04 NOTE — Patient Outreach (Signed)
Outreach call to pt for telephone assessment, no answer to telephone, left voicemail requesting return phone call.  RN CM mailed unsuccessful outreach letter to pt home.  PLAN Outreach pt in 3-4 business days  Hani Patnode RNC, BSN [facility] Coordinator [phone]   

## 2019-07-08 ENCOUNTER — Other Ambulatory Visit: Payer: Self-pay

## 2019-07-09 ENCOUNTER — Ambulatory Visit: Payer: BC Managed Care – PPO | Admitting: Physician Assistant

## 2019-07-09 ENCOUNTER — Encounter: Payer: Self-pay | Admitting: Physician Assistant

## 2019-07-09 ENCOUNTER — Other Ambulatory Visit: Payer: Self-pay | Admitting: *Deleted

## 2019-07-09 VITALS — BP 129/78 | HR 81 | Temp 97.8°F | Ht 67.0 in | Wt 297.6 lb

## 2019-07-09 DIAGNOSIS — E1169 Type 2 diabetes mellitus with other specified complication: Secondary | ICD-10-CM | POA: Diagnosis not present

## 2019-07-09 DIAGNOSIS — M5442 Lumbago with sciatica, left side: Secondary | ICD-10-CM

## 2019-07-09 DIAGNOSIS — Z8659 Personal history of other mental and behavioral disorders: Secondary | ICD-10-CM | POA: Diagnosis not present

## 2019-07-09 DIAGNOSIS — M797 Fibromyalgia: Secondary | ICD-10-CM

## 2019-07-09 DIAGNOSIS — G8929 Other chronic pain: Secondary | ICD-10-CM

## 2019-07-09 DIAGNOSIS — M5441 Lumbago with sciatica, right side: Secondary | ICD-10-CM

## 2019-07-09 DIAGNOSIS — M25562 Pain in left knee: Secondary | ICD-10-CM

## 2019-07-09 DIAGNOSIS — M25561 Pain in right knee: Secondary | ICD-10-CM

## 2019-07-09 LAB — BAYER DCA HB A1C WAIVED: HB A1C (BAYER DCA - WAIVED): 7.2 % — ABNORMAL HIGH (ref <7.0)

## 2019-07-09 NOTE — Progress Notes (Signed)
BP 129/78   Pulse 81   Temp 97.8 F (36.6 C) (Temporal)   Ht '5\' 7"'  (1.702 m)   Wt 297 lb 9.6 oz (135 kg)   BMI 46.61 kg/m    Subjective:    Patient ID: Cynthia Espinoza, female    DOB: 1956-03-19, 63 y.o.   MRN: [id number]  HPI: SHAKEELA RABADAN is a 63 y.o. female presenting on 07/09/2019 for Diabetes (4 week follow up)  The patient comes in for follow-up on her diabetes.  She had a hemoglobin A1c performed today and it is doing extremely better.  She has done a very good job with all of her diet changes.  Her glucose log is excellent.  She will continue her medication.  We will start to slowly titrate down her insulin  The patient is still undergoing evaluation for chronic disability related to her osteoarthritis, fibromyalgia, lumbar pain with sciatica, posttraumatic stress disorder, fibromyalgia.  Patient still undergoing counseling.  She is continue to take her medication.  She does have an upcoming hearing on 09/06/2019.    The patient states that she is still not able to walk very far in a store.  She always has to use a walker.  She will sometimes even use a scooter if she has to walk a long way in the store.  She can never go for very far.  She has been doing this for couple years now.   Past Medical History:  Diagnosis Date  . Anxiety   . Depression   . Hypertension   . Thyroid disease    Relevant past medical, surgical, family and social history reviewed and updated as indicated. Interim medical history since our last visit reviewed. Allergies and medications reviewed and updated. DATA REVIEWED: CHART IN EPIC  Family History reviewed for pertinent findings.  Review of Systems  Constitutional: Positive for fatigue. Negative for activity change and fever.  HENT: Negative.   Eyes: Negative.   Respiratory: Negative.  Negative for cough.   Cardiovascular: Negative.  Negative for chest pain.  Gastrointestinal: Negative.  Negative for abdominal pain.  Endocrine:  Negative.   Genitourinary: Negative.  Negative for dysuria.  Musculoskeletal: Positive for arthralgias, back pain, gait problem, joint swelling and myalgias.  Skin: Negative.     Allergies as of 07/09/2019      Reactions   Metformin And Related    GI distress   Asa [aspirin] Rash   Penicillins Rash   Did it involve swelling of the face/tongue/throat, SOB, or low BP? Yes Did it involve sudden or severe rash/hives, skin peeling, or any reaction on the inside of your mouth or nose? Yes Did you need to seek medical attention at a hospital or doctor's office? Yes When did it last happen?Over 10 years ago If all above answers are "NO", may proceed with cephalosporin use.      Medication List       Accurate as of July 09, 2019 11:59 PM. If you have any questions, ask your nurse or doctor.        ALPRAZolam 0.5 MG tablet Commonly known as: XANAX Take 1 tablet (0.5 mg total) by mouth 2 (two) times daily as needed for anxiety.   amLODipine 10 MG tablet Commonly known as: NORVASC Take 1 tablet (10 mg total) by mouth daily.   benazepril 40 MG tablet Commonly known as: LOTENSIN Take 1 tablet (40 mg total) by mouth daily.   blood glucose meter kit and  supplies Dispense based on patient and insurance preference. Use up to four times daily as directed. (FOR ICD-10 E10.9, E11.9).   canagliflozin 100 MG Tabs tablet Commonly known as: Invokana Take 1 tablet (100 mg total) by mouth daily before breakfast.   DULoxetine 30 MG capsule Commonly known as: Cymbalta Take 1 capsule (30 mg total) by mouth daily.   hydrochlorothiazide 25 MG tablet Commonly known as: HYDRODIURIL Take 1 tablet (25 mg total) by mouth daily.   Insulin Pen Needle 29G X 5MM Misc 1 Units by Does not apply route 2 (two) times a day. Insulin needles for relion insulin pen.   levothyroxine 200 MCG tablet Commonly known as: SYNTHROID Take 1 tablet (200 mcg total) by mouth daily before breakfast.   meloxicam 7.5  MG tablet Commonly known as: MOBIC Take 1 tablet (7.5 mg total) by mouth daily.   NovoLIN 70/30 ReliOn (70-30) 100 UNIT/ML injection Generic drug: insulin NPH-regular Human Inject 15-30 Units into the skin 2 (two) times daily with a meal.          Objective:    BP 129/78   Pulse 81   Temp 97.8 F (36.6 C) (Temporal)   Ht '5\' 7"'  (1.702 m)   Wt 297 lb 9.6 oz (135 kg)   BMI 46.61 kg/m   Allergies  Allergen Reactions  . Metformin And Related     GI distress   . Asa [Aspirin] Rash  . Penicillins Rash    Did it involve swelling of the face/tongue/throat, SOB, or low BP? Yes Did it involve sudden or severe rash/hives, skin peeling, or any reaction on the inside of your mouth or nose? Yes Did you need to seek medical attention at a hospital or doctor's office? Yes When did it last happen?Over 10 years ago If all above answers are "NO", may proceed with cephalosporin use.     Wt Readings from Last 3 Encounters:  07/09/19 297 lb 9.6 oz (135 kg)  05/24/19 291 lb 6.4 oz (132.2 kg)  04/12/19 294 lb 9.6 oz (133.6 kg)    Physical Exam Constitutional:      General: She is not in acute distress.    Appearance: Normal appearance. She is well-developed.  HENT:     Head: Normocephalic and atraumatic.  Cardiovascular:     Rate and Rhythm: Normal rate.  Pulmonary:     Effort: Pulmonary effort is normal.  Musculoskeletal:     Right knee: She exhibits decreased range of motion and swelling.     Left knee: She exhibits decreased range of motion and swelling.     Lumbar back: She exhibits decreased range of motion and tenderness.  Skin:    General: Skin is warm and dry.     Findings: No rash.  Neurological:     Mental Status: She is alert and oriented to person, place, and time.     Deep Tendon Reflexes: Reflexes are normal and symmetric.     Results for orders placed or performed in visit on 07/09/19  Bayer DCA Hb A1c Waived  Result Value Ref Range   HB A1C (BAYER DCA -  WAIVED) 7.2 (H) <7.0 %      Assessment & Plan:   1. Type 2 diabetes mellitus with other specified complication, without long-term current use of insulin (HCC) - Bayer DCA Hb A1c Waived  2. Chronic bilateral low back pain with bilateral sciatica Continue medications   3. Chronic pain of both knees Continue medications  4. History of  posttraumatic stress disorder (PTSD) Continue medications and counseling  5. Fibromyalgia Continue medications    Continue all other maintenance medications as listed above.  Follow up plan: Return in about 3 months (around 10/09/2019) for recheck medications.  Educational handout given for carb counting  Terald Sleeper PA-C [place] [address]  [place], [place] 09/29/1956 [phone]   07/14/2019, 5:14 PM

## 2019-07-09 NOTE — Patient Outreach (Signed)
Outreach call to pt for telephone assessment, no answer to telephone, left voicemail requesting return phone call.  PLAN Outreach pt in 3-4 business days  Glen Kesinger RNC, BSN [facility] Care Coordinator [phone]   

## 2019-07-12 ENCOUNTER — Other Ambulatory Visit: Payer: Self-pay | Admitting: *Deleted

## 2019-07-12 ENCOUNTER — Ambulatory Visit: Payer: Self-pay | Admitting: Licensed Clinical Social Worker

## 2019-07-12 DIAGNOSIS — M797 Fibromyalgia: Secondary | ICD-10-CM

## 2019-07-12 DIAGNOSIS — E039 Hypothyroidism, unspecified: Secondary | ICD-10-CM

## 2019-07-12 DIAGNOSIS — E1169 Type 2 diabetes mellitus with other specified complication: Secondary | ICD-10-CM

## 2019-07-12 DIAGNOSIS — E119 Type 2 diabetes mellitus without complications: Secondary | ICD-10-CM

## 2019-07-12 DIAGNOSIS — I10 Essential (primary) hypertension: Secondary | ICD-10-CM

## 2019-07-12 DIAGNOSIS — Z8659 Personal history of other mental and behavioral disorders: Secondary | ICD-10-CM

## 2019-07-12 NOTE — Patient Outreach (Signed)
Outreach call to pt for telephone assessment/  3rd attempt, no answer to telephone, left voicemail requesting return phone call.  PLAN Close case in 3-4 business days   Jacqlyn Larsen [facility] [place] Coordinator [phone]

## 2019-07-12 NOTE — Patient Instructions (Signed)
Licensed Clinical Social Worker Visit Information  Goals we discussed today:  Goals    . Client wants to talk with LCSW about depression/stress issues facing client (pt-stated)     Current Barriers:  . Pain issues from fibromyalgia . Finances Challenges . Has been working on [employer] but it is not approved yet. This is challenge area .  [name] Kitchen   Clinical Social Work Clinical Goal(s):  [name] Kitchen LCSW and client will talk in next 30 days about depression and stress issues faced by client  Interventions: . Previously provided patient with information about LCSW support  .  Previously talked about depression issues facing client  . Previously talked with client about barriers facing client . Previously talked with client about relaxation techniques of choice (watch TV, takes walks with use of walker, likes to talk with family or friends via phone) . Previously talked with client about PTSD and past treatment for PTSD.    Patient Self Care Activities:  . Self administers medications as prescribed . Attends all scheduled provider appointments   Plan:  Client to attend scheduled medical appointments Client to use relaxation techniques of choice to help her manage depression symptoms LCSW to call client in next 3 weeks to talk with clinet about mangement of depression symptoms .  Initial goal documentation       Materials Provided: No  Follow Up Plan: LCSW to call client in next 3 weeks to talk with client about client management of depression symptoms.  LCSW was not able to speak via phone with client today. Thus, the patient was not able to verbalize understanding of instructions provided today and was not able to accept or decline a print copy of patient instruction materials.   Norva Riffle.Ladarrian Asencio MSW, LCSW Licensed Clinical Social Worker [facility] Care Management [phone]

## 2019-07-12 NOTE — Chronic Care Management (AMB) (Signed)
  Care Management Note   Cynthia Espinoza is a 63 y.o. year old female who is a primary care patient of Terald Sleeper, PA-C. The CM team was consulted for assistance with chronic disease management and care coordination.   I reached out to [name] by phone today.   Social Determinants of Health :risk of depression; risk of tobacco use    Chronic Care Management from 05/15/2019 in [place]  PHQ-9 Total Score  8     GAD 7 : Generalized Anxiety Score 05/15/2019  Nervous, Anxious, on Edge 2  Control/stop worrying 1  Worry too much - different things 1  Trouble relaxing 1  Restless 0  Easily annoyed or irritable 0  Afraid - awful might happen 1  Total GAD 7 Score 6  Anxiety Difficulty Somewhat difficult     Medications   New medications from outside sources are available for reconciliation   ALPRAZolam (XANAX) 0.5 MG tablet    amLODipine (NORVASC) 10 MG tablet    benazepril (LOTENSIN) 40 MG tablet    blood glucose meter kit and supplies    canagliflozin (INVOKANA) 100 MG TABS tablet    DULoxetine (CYMBALTA) 30 MG capsule    hydrochlorothiazide (HYDRODIURIL) 25 MG tablet    insulin NPH-regular Human (NOVOLIN 70/30 RELION) (70-30) 100 UNIT/ML injection(Expired)    Insulin Pen Needle 29G X 5MM MISC    levothyroxine (SYNTHROID) 200 MCG tablet    meloxicam (MOBIC) 7.5 MG tablet     Goals Discussed:                           . Client wants to talk with LCSW about depression/stress issues facing client (pt-stated)        Current Barriers:   Pain issues from [place]  Has been working on [employer] but it is not approved yet. This is challenge area      Clinical Social Work Clinical Goal(s):   LCSW and client will talk in next 30 days about depression and stress issues faced by client  Interventions:  Previously provided patient with information about LCSW support    Previously  talked with client  about depression issues facing client   Previously talked with client about barriers facing client  Talked previously  with client about relaxation techniques of choice (watch TV, takes walks with use of walker, likes to talk with family or friends via phone)  Previously talked with client about PTSD and past treatment for PTSD.    Patient Self Care Activities:   Self administers medications as prescribed  Attends all scheduled provider appointments   Plan:  Client to attend scheduled medical appointments Client to use relaxation techniques of choice to help her manage depression symptoms LCSW to call client in next 3 weeks to talk with clinet about mangement of depression symptoms .  Initial goal documentation        Follow Up Plan: LCSW to call client in next 3 weeks to talk with client about management of depression symptoms  Norva Riffle.Karlissa Aron MSW, LCSW Licensed Clinical Social Worker [facility] Care Management [phone]

## 2019-07-19 ENCOUNTER — Other Ambulatory Visit: Payer: Self-pay | Admitting: *Deleted

## 2019-07-19 NOTE — Patient Outreach (Signed)
Case closed due to unable to maintain contact.  Mailed case closure letter to pt home and faxed case closure letter to primary care provider.  Jacqlyn Larsen [facility] [place] Coordinator [phone]

## 2019-08-05 ENCOUNTER — Ambulatory Visit: Payer: Self-pay | Admitting: Licensed Clinical Social Worker

## 2019-08-05 DIAGNOSIS — M797 Fibromyalgia: Secondary | ICD-10-CM

## 2019-08-05 DIAGNOSIS — E1169 Type 2 diabetes mellitus with other specified complication: Secondary | ICD-10-CM

## 2019-08-05 DIAGNOSIS — G8929 Other chronic pain: Secondary | ICD-10-CM

## 2019-08-05 DIAGNOSIS — I10 Essential (primary) hypertension: Secondary | ICD-10-CM

## 2019-08-05 DIAGNOSIS — M159 Polyosteoarthritis, unspecified: Secondary | ICD-10-CM

## 2019-08-05 DIAGNOSIS — F3341 Major depressive disorder, recurrent, in partial remission: Secondary | ICD-10-CM

## 2019-08-05 DIAGNOSIS — M25562 Pain in left knee: Secondary | ICD-10-CM

## 2019-08-05 DIAGNOSIS — Z8659 Personal history of other mental and behavioral disorders: Secondary | ICD-10-CM

## 2019-08-05 DIAGNOSIS — E039 Hypothyroidism, unspecified: Secondary | ICD-10-CM

## 2019-08-05 NOTE — Chronic Care Management (AMB) (Signed)
  Care Management Note   Cynthia Espinoza is a 63 y.o. year old female who is a primary care patient of Terald Sleeper, PA-C. The CM team was consulted for assistance with chronic disease management and care coordination.   I reached out to [name] by phone today.   Review of patient status, including review of consultants reports, relevant laboratory and other test results, and collaboration with appropriate care team members and the patient's provider was performed as part of comprehensive patient evaluation and provision of chronic care management services.   Social Determinants of Health:  Risk of tobacco use; risk of depression    Chronic Care Management from 05/15/2019 in [place]  PHQ-9 Total Score  8     GAD 7 : Generalized Anxiety Score 05/15/2019  Nervous, Anxious, on Edge 2  Control/stop worrying 1  Worry too much - different things 1  Trouble relaxing 1  Restless 0  Easily annoyed or irritable 0  Afraid - awful might happen 1  Total GAD 7 Score 6  Anxiety Difficulty Somewhat difficult   Medications   New medications from outside sources are available for reconciliation   ALPRAZolam (XANAX) 0.5 MG tablet    amLODipine (NORVASC) 10 MG tablet    benazepril (LOTENSIN) 40 MG tablet    blood glucose meter kit and supplies    canagliflozin (INVOKANA) 100 MG TABS tablet    DULoxetine (CYMBALTA) 30 MG capsule    hydrochlorothiazide (HYDRODIURIL) 25 MG tablet    insulin NPH-regular Human (NOVOLIN 70/30 RELION) (70-30) 100 UNIT/ML injection(Expired)    Insulin Pen Needle 29G X 5MM MISC    levothyroxine (SYNTHROID) 200 MCG tablet    meloxicam (MOBIC) 7.5 MG tablet     Goals    . Client wants to talk with LCSW about depression/stress issues facing client (pt-stated)     Current Barriers:  . Pain issues from fibromyalgia . Finances Challenges . Has been working on [employer] but it is not approved yet. This is challenge area .  [name] Kitchen    Clinical Social Work Clinical Goal(s):  [name] Kitchen LCSW and client will talk in next 30 days about depression and stress issues faced by client  Interventions: . Previously provided patient with information about LCSW support  . Previously talked with client about depression issues facing client  . Talked previously with client about barriers facing client . Talked previously with client about relaxation techniques of choice (watch TV, takes walks with use of walker, likes to talk with family or friends via phone) . Previously talked with client about PTSD and past treatment for PTSD.    Patient Self Care Activities:  . Self administers medications as prescribed . Attends all scheduled provider appointments   Plan:  Client to attend scheduled medical appointments Client to use relaxation techniques of choice to help her manage depression symptoms LCSW to call client in next 3 weeks to talk with clinet about mangement of depression symptoms .  Initial goal documentation        Follow Up Plan: LCSW to call client in next 3 weeks to talk with client about management of depression symptoms of client  Norva Riffle.[name] MSW, LCSW Licensed Clinical Social Worker [facility] [phone]

## 2019-08-05 NOTE — Patient Instructions (Signed)
Licensed Clinical Social Worker Visit Information  Goals we discussed today:  Goals    . Client wants to talk with LCSW about depression/stress issues facing client (pt-stated)     Current Barriers:  . Pain issues from fibromyalgia . Finances Challenges . Has been working on [employer] but it is not approved yet. This is challenge area .  [name] Kitchen   Clinical Social Work Clinical Goal(s):  [name] Kitchen LCSW and client will talk in next 30 days about depression and stress issues faced by client  Interventions: . Previously provided patient with information about LCSW support  .  Talked previously about depression issues facing client  . Talked previously with client about barriers facing client . Talked previously with client about relaxation techniques of choice (watch TV, takes walks with use of walker, likes to talk with family or friends via phone) . Previously talked with client about PTSD and past treatment for PTSD.    Patient Self Care Activities:  . Self administers medications as prescribed . Attends all scheduled provider appointments   Plan:  Client to attend scheduled medical appointments Client to use relaxation techniques of choice to help her manage depression symptoms LCSW to call client in next 3 weeks to talk with clinet about mangement of depression symptoms .  Initial goal documentation         Materials Provided: No  Follow Up Plan:  LCSW to call client in next 3 weeks to talk with client about management of depression symptoms of client  The patient verbalized understanding of instructions provided today and declined a print copy of patient instruction materials.   Norva Riffle.Aydin Hink MSW, LCSW Licensed Clinical Social Worker [facility] Care Management [phone]

## 2019-08-22 ENCOUNTER — Other Ambulatory Visit: Payer: Self-pay | Admitting: Physician Assistant

## 2019-08-22 DIAGNOSIS — E039 Hypothyroidism, unspecified: Secondary | ICD-10-CM

## 2019-08-22 DIAGNOSIS — F3341 Major depressive disorder, recurrent, in partial remission: Secondary | ICD-10-CM

## 2019-08-27 ENCOUNTER — Ambulatory Visit: Payer: Self-pay | Admitting: Licensed Clinical Social Worker

## 2019-08-27 DIAGNOSIS — M159 Polyosteoarthritis, unspecified: Secondary | ICD-10-CM

## 2019-08-27 DIAGNOSIS — I10 Essential (primary) hypertension: Secondary | ICD-10-CM

## 2019-08-27 DIAGNOSIS — M5442 Lumbago with sciatica, left side: Secondary | ICD-10-CM

## 2019-08-27 DIAGNOSIS — G8929 Other chronic pain: Secondary | ICD-10-CM

## 2019-08-27 DIAGNOSIS — Z8659 Personal history of other mental and behavioral disorders: Secondary | ICD-10-CM

## 2019-08-27 DIAGNOSIS — F3341 Major depressive disorder, recurrent, in partial remission: Secondary | ICD-10-CM

## 2019-08-27 DIAGNOSIS — E1169 Type 2 diabetes mellitus with other specified complication: Secondary | ICD-10-CM

## 2019-08-27 NOTE — Patient Instructions (Signed)
Licensed Clinical Social Worker Visit Information  Goals we discussed today:  Goals    . Client wants to talk with LCSW about depression/stress issues facing client (pt-stated)     Current Barriers:  . Pain issues from fibromyalgia . Finances Challenges . Has been working on [employer] but it is not approved yet. This is challenge area .  [name] Kitchen   Clinical Social Work Clinical Goal(s):  [name] Kitchen LCSW and client will talk in next 30 days about depression and stress issues faced by client  Interventions: . Provided patient with information about LCSW support  .  Talked about depression issues facing client  . Talked with client about relaxation techniques of choice (watch TV, takes walks with use of walker, likes to talk with family or friends via phone) . Talked with client about upcoming client Disability Hearing (via phone) for client on September 06, 2019. . Talked with client about pain issues of client    Patient Self Care Activities:  . Self administers medications as prescribed . Attends all scheduled provider appointments   Plan:  Client to attend scheduled medical appointments Client to use relaxation techniques of choice to help her manage depression symptoms LCSW to call client in next 3 weeks to talk with clinet about mangement of depression symptoms .  Initial goal documentation        Materials Provided: No  Follow Up Plan:  LCSW to call client in next 3 weeks to talk with client about management of depression symptoms.  The patient verbalized understanding of instructions provided today and declined a print copy of patient instruction materials.   Norva Riffle.Velda Wendt MSW, LCSW Licensed Clinical Social Worker [facility] [phone]

## 2019-08-27 NOTE — Chronic Care Management (AMB) (Signed)
  Care Management Note   Cynthia Espinoza is a 63 y.o. year old female who is a primary care patient of Terald Sleeper, PA-C. The CM team was consulted for assistance with chronic disease management and care coordination.   I reached out to [name] by phone today.   Review of patient status, including review of consultants reports, relevant laboratory and other test results, and collaboration with appropriate care team members and the patient's provider was performed as part of comprehensive patient evaluation and provision of chronic care management services.   Social Determinants of Health: risk of tobacco use; risk of depression    Chronic Care Management from 05/15/2019 in [place]  PHQ-9 Total Score  8     GAD 7 : Generalized Anxiety Score 05/15/2019  Nervous, Anxious, on Edge 2  Control/stop worrying 1  Worry too much - different things 1  Trouble relaxing 1  Restless 0  Easily annoyed or irritable 0  Afraid - awful might happen 1  Total GAD 7 Score 6  Anxiety Difficulty Somewhat difficult   Medications   New medications from outside sources are available for reconciliation   ALPRAZolam (XANAX) 0.5 MG tablet    amLODipine (NORVASC) 10 MG tablet    benazepril (LOTENSIN) 40 MG tablet    blood glucose meter kit and supplies    canagliflozin (INVOKANA) 100 MG TABS tablet    DULoxetine (CYMBALTA) 30 MG capsule    hydrochlorothiazide (HYDRODIURIL) 25 MG tablet    insulin NPH-regular Human (NOVOLIN 70/30 RELION) (70-30) 100 UNIT/ML injection(Expired)    Insulin Pen Needle 29G X 5MM MISC    levothyroxine (SYNTHROID) 200 MCG tablet    meloxicam (MOBIC) 7.5 MG tablet     Goals    . Client wants to talk with LCSW about depression/stress issues facing client (pt-stated)     Current Barriers:  . Pain issues from fibromyalgia . Finances Challenges . Has been working on [employer] but it is not approved yet. This is challenge area .  [name] Kitchen    Clinical Social Work Clinical Goal(s):  [name] Kitchen LCSW and client will talk in next 30 days about depression and stress issues faced by client  Interventions: . Provided patient with information about LCSW support  .  Talked about depression issues facing client  . Talked with client about relaxation techniques of choice (watch TV, takes walks with use of walker, likes to talk with family or friends via phone) . Talked about client Disability Hearing via phone on 09/06/2019.  . Talked with client about pain issues of client  Patient Self Care Activities:  . Self administers medications as prescribed . Attends all scheduled provider appointments   Plan:  Client to attend scheduled medical appointments Client to use relaxation techniques of choice to help her manage depression symptoms LCSW to call client in next 3 weeks to talk with clinet about mangement of depression symptoms .  Initial goal documentation        Follow Up Plan: LCSW to call client in next 3 weeks to talk with client about management of depression symptoms  Norva Riffle.Janea Schwenn MSW, LCSW Licensed Clinical Social Worker [facility] [phone]

## 2019-08-30 ENCOUNTER — Encounter: Payer: Self-pay | Admitting: *Deleted

## 2019-09-17 ENCOUNTER — Ambulatory Visit: Payer: Self-pay | Admitting: Licensed Clinical Social Worker

## 2019-09-17 DIAGNOSIS — I10 Essential (primary) hypertension: Secondary | ICD-10-CM

## 2019-09-17 DIAGNOSIS — M5441 Lumbago with sciatica, right side: Secondary | ICD-10-CM

## 2019-09-17 DIAGNOSIS — G8929 Other chronic pain: Secondary | ICD-10-CM

## 2019-09-17 DIAGNOSIS — F3341 Major depressive disorder, recurrent, in partial remission: Secondary | ICD-10-CM

## 2019-09-17 DIAGNOSIS — E039 Hypothyroidism, unspecified: Secondary | ICD-10-CM

## 2019-09-17 DIAGNOSIS — Z8659 Personal history of other mental and behavioral disorders: Secondary | ICD-10-CM

## 2019-09-17 DIAGNOSIS — E1169 Type 2 diabetes mellitus with other specified complication: Secondary | ICD-10-CM

## 2019-09-17 DIAGNOSIS — M797 Fibromyalgia: Secondary | ICD-10-CM

## 2019-09-17 DIAGNOSIS — M159 Polyosteoarthritis, unspecified: Secondary | ICD-10-CM

## 2019-09-17 NOTE — Patient Instructions (Addendum)
Licensed Clinical Social Worker Visit Information  Goals we discussed today:  Goals    . Client wants to talk with LCSW about depression/stress issues facing client (pt-stated)     Current Barriers:  . Pain issues from fibromyalgia . Finances Challenges . Has been working on [employer] but it is not approved yet. This is challenge area .  [name] Kitchen   Clinical Social Work Clinical Goal(s):  [name] Kitchen LCSW and client will talk in next 30 days about depression and stress issues faced by client  Interventions: .  Talked about depression issues facing client  . Talked with client about relaxation techniques of choice (watch TV, takes walks with use of walker, likes to talk with family or friends via phone)    Talked with client about medical records confidentiality.  Talked with client about client's recent Disability Phone Call Hearing on 10/23,2020 . Talked with client about pain issues faced by client.    Patient Self Care Activities:  . Self administers medications as prescribed . Attends all scheduled provider appointments   Plan:  Client to attend scheduled medical appointments Client to use relaxation techniques of choice to help her manage depression symptoms LCSW to call client in next 3 weeks to talk with clinet about mangement of depression symptoms .  Initial goal documentation         Materials Provided: No  Follow Up Plan:  LCSW to call client in next 3 weeks to talk with client about management of depression symptoms of client  The patient verbalized understanding of instructions provided today and declined a print copy of patient instruction materials.   Norva Riffle.Finneus Kaneshiro MSW, LCSW Licensed Clinical Social Worker [facility] [phone]

## 2019-09-17 NOTE — Chronic Care Management (AMB) (Signed)
  Care Management Note   Cynthia Espinoza is a 63 y.o. year old female who is a primary care patient of Cynthia Sleeper, PA-C. The CM team was consulted for assistance with chronic disease management and care coordination.   I reached out to Cynthia Espinoza by phone today.   Review of patient status, including review of consultants reports, relevant laboratory and other test results, and collaboration with appropriate care team members and the patient's provider was performed as part of comprehensive patient evaluation and provision of chronic care management services.   Social Determinants of Health: risk of tobacco use; risk of financial strain    Chronic Care Management from 05/15/2019 in [place]  PHQ-9 Total Score  8     GAD 7 : Generalized Anxiety Score 05/15/2019  Nervous, Anxious, on Edge 2  Control/stop worrying 1  Worry too much - different things 1  Trouble relaxing 1  Restless 0  Easily annoyed or irritable 0  Afraid - awful might happen 1  Total GAD 7 Score 6  Anxiety Difficulty Somewhat difficult   Medications   New medications from outside sources are available for reconciliation   ALPRAZolam (XANAX) 0.5 MG tablet    amLODipine (NORVASC) 10 MG tablet    benazepril (LOTENSIN) 40 MG tablet    blood glucose meter kit and supplies    canagliflozin (INVOKANA) 100 MG TABS tablet    DULoxetine (CYMBALTA) 30 MG capsule    hydrochlorothiazide (HYDRODIURIL) 25 MG tablet    insulin NPH-regular Human (NOVOLIN 70/30 RELION) (70-30) 100 UNIT/ML injection(Expired)    Insulin Pen Needle 29G X 5MM MISC    levothyroxine (SYNTHROID) 200 MCG tablet    meloxicam (MOBIC) 7.5 MG tablet     Goals    . Client wants to talk with LCSW about depression/stress issues facing client (pt-stated)     Current Barriers:  . Pain issues from fibromyalgia . Finances Challenges . Has been working on [employer] but it is not approved yet. This is challenge area   Clinical Social Work Clinical Goal(s):  [name] Kitchen LCSW and client will talk in next 30 days about depression and stress issues faced by client  Interventions: .  Talked about depression issues facing client  . Talked with client about medical records confidentiality. . Talked with client about relaxation techniques of choice (watch TV, takes walks with use of walker, likes to talk with family or friends via phone) . Talked with client about client's recent Disability Phone Call Hearing on 10/23,2020 . Talked with client about pain issues faced by client.    Patient Self Care Activities:  . Self administers medications as prescribed . Attends all scheduled provider appointments   Plan:  Client to attend scheduled medical appointments Client to use relaxation techniques of choice to help her manage depression symptoms LCSW to call client in next 3 weeks to talk with clinet about mangement of depression symptoms .  Initial goal documentation       Follow Up Plan: LCSW to call client in next 3 weeks to talk with client about management of depression symptoms of client  Norva Riffle.Charleston Vierling MSW, LCSW Licensed Clinical Social Worker [facility] [phone]

## 2019-10-09 ENCOUNTER — Ambulatory Visit: Payer: Self-pay | Admitting: Licensed Clinical Social Worker

## 2019-10-09 DIAGNOSIS — F3341 Major depressive disorder, recurrent, in partial remission: Secondary | ICD-10-CM

## 2019-10-09 DIAGNOSIS — M159 Polyosteoarthritis, unspecified: Secondary | ICD-10-CM

## 2019-10-09 DIAGNOSIS — E1169 Type 2 diabetes mellitus with other specified complication: Secondary | ICD-10-CM

## 2019-10-09 DIAGNOSIS — Z8659 Personal history of other mental and behavioral disorders: Secondary | ICD-10-CM

## 2019-10-09 DIAGNOSIS — M797 Fibromyalgia: Secondary | ICD-10-CM

## 2019-10-09 DIAGNOSIS — I10 Essential (primary) hypertension: Secondary | ICD-10-CM

## 2019-10-09 NOTE — Chronic Care Management (AMB) (Signed)
  Care Management Note   Cynthia Espinoza is a 63 y.o. year old female who is a primary care patient of Cynthia Sleeper, PA-C. The CM team was consulted for assistance with chronic disease management and care coordination.   I reached out to Cynthia Espinoza by phone today.   Review of patient status, including review of consultants reports, relevant laboratory and other test results, and collaboration with appropriate care team members and the patient's provider was performed as part of comprehensive patient evaluation and provision of chronic care management services.   Social determinants of health: risk of tobacco use; risk of depression    Chronic Care Management from 05/15/2019 in [place]  PHQ-9 Total Score  8     GAD 7 : Generalized Anxiety Score 05/15/2019  Nervous, Anxious, on Edge 2  Control/stop worrying 1  Worry too much - different things 1  Trouble relaxing 1  Restless 0  Easily annoyed or irritable 0  Afraid - awful might happen 1  Total GAD 7 Score 6  Anxiety Difficulty Somewhat difficult   Medications   New medications from outside sources are available for reconciliation   ALPRAZolam (XANAX) 0.5 MG tablet    amLODipine (NORVASC) 10 MG tablet    benazepril (LOTENSIN) 40 MG tablet    blood glucose meter kit and supplies    canagliflozin (INVOKANA) 100 MG TABS tablet    DULoxetine (CYMBALTA) 30 MG capsule    hydrochlorothiazide (HYDRODIURIL) 25 MG tablet    insulin NPH-regular Human (NOVOLIN 70/30 RELION) (70-30) 100 UNIT/ML injection(Expired)    Insulin Pen Needle 29G X 5MM MISC    levothyroxine (SYNTHROID) 200 MCG tablet    meloxicam (MOBIC) 7.5 MG tablet     Goals    . Client wants to talk with LCSW about depression/stress issues facing client (pt-stated)     Current Barriers:  . Pain issues from fibromyalgia . Finances Challenges . Has been working on [employer] but it is not approved yet. This is challenge area  Clinical  Social Work Clinical Goal(s):  Cynthia Espinoza Kitchen LCSW and client will talk in next 30 days about depression and stress issues faced by client  Interventions: .  Talked previously with client about depression issues facing client  . Talked previously with client about barriers facing client . Talked with client about relaxation techniques of choice (watch TV, takes walks with use of walker, likes to talk with family or friends via phone) . Talked with client about her Social Security Disability approval . Talked with client about her upcoming medical appointments  Patient Self Care Activities:  . Self administers medications as prescribed . Attends all scheduled provider appointments   Plan:  Client to attend scheduled medical appointments Client to use relaxation techniques of choice to help her manage depression symptoms LCSW to call client in next 3 weeks to talk with clinet about mangement of depression symptoms .  Initial goal documentation       Follow Up Plan: LCSW to call client in next 3 weeks to talk with client about her management of depression symptoms faced by client  Cynthia Espinoza.Cynthia Espinoza MSW, LCSW Licensed Clinical Social Worker [facility] [phone]

## 2019-10-09 NOTE — Patient Instructions (Addendum)
Licensed Clinical Social Worker Visit Information  Goals we discussed today:  Goals    . Client wants to talk with LCSW about depression/stress issues facing client (pt-stated)     Current Barriers:  . Pain issues from fibromyalgia . Finances Challenges . Has been working on [employer] but it is not approved yet. This is challenge area .  [name] Kitchen   Clinical Social Work Clinical Goal(s):  [name] Kitchen LCSW and client will talk in next 30 days about depression and stress issues faced by client  Interventions: .  Talked previously with client about depression issues facing client  . Talked with client previously about barriers facing client . Talked with client about relaxation techniques of choice (watch TV, takes walks with use of walker, likes to talk with family or friends via phone)    Talked with client about her [employer] approval . Talked with client about her upcoming medical appointments  Patient Self Care Activities:  . Self administers medications as prescribed . Attends all scheduled provider appointments   Plan:  Client to attend scheduled medical appointments Client to use relaxation techniques of choice to help her manage depression symptoms LCSW to call client in next 3 weeks to talk with clinet about mangement of depression symptoms .  Initial goal documentation         Materials Provided: No  Follow Up Plan:  LCSW to call client in next 3 weeks to talk with client about her management of depression symptoms faced  The patient verbalized understanding of instructions provided today and declined a print copy of patient instruction materials.   Norva Riffle.Roshawnda Pecora MSW, LCSW Licensed Clinical Social Worker [facility] [phone]

## 2019-10-11 ENCOUNTER — Ambulatory Visit: Payer: BC Managed Care – PPO | Admitting: Physician Assistant

## 2019-10-14 ENCOUNTER — Other Ambulatory Visit: Payer: Self-pay

## 2019-10-15 ENCOUNTER — Other Ambulatory Visit: Payer: Self-pay

## 2019-10-15 ENCOUNTER — Ambulatory Visit: Payer: BC Managed Care – PPO | Admitting: Physician Assistant

## 2019-10-15 ENCOUNTER — Encounter: Payer: Self-pay | Admitting: Physician Assistant

## 2019-10-15 VITALS — BP 135/81 | HR 74 | Temp 98.5°F | Ht 67.0 in | Wt 310.2 lb

## 2019-10-15 DIAGNOSIS — M25561 Pain in right knee: Secondary | ICD-10-CM | POA: Diagnosis not present

## 2019-10-15 DIAGNOSIS — I10 Essential (primary) hypertension: Secondary | ICD-10-CM

## 2019-10-15 DIAGNOSIS — E1169 Type 2 diabetes mellitus with other specified complication: Secondary | ICD-10-CM

## 2019-10-15 DIAGNOSIS — M797 Fibromyalgia: Secondary | ICD-10-CM

## 2019-10-15 DIAGNOSIS — E039 Hypothyroidism, unspecified: Secondary | ICD-10-CM | POA: Diagnosis not present

## 2019-10-15 DIAGNOSIS — F3341 Major depressive disorder, recurrent, in partial remission: Secondary | ICD-10-CM

## 2019-10-15 DIAGNOSIS — M25562 Pain in left knee: Secondary | ICD-10-CM

## 2019-10-15 DIAGNOSIS — G8929 Other chronic pain: Secondary | ICD-10-CM

## 2019-10-15 LAB — BAYER DCA HB A1C WAIVED: HB A1C (BAYER DCA - WAIVED): 7.1 % — ABNORMAL HIGH (ref <7.0)

## 2019-10-15 MED ORDER — AMLODIPINE BESYLATE 10 MG PO TABS: 10.0000 mg | ORAL_TABLET | Freq: Every day | ORAL | 1 refills | Status: DC

## 2019-10-15 MED ORDER — BENAZEPRIL HCL 40 MG PO TABS: 40.0000 mg | ORAL_TABLET | Freq: Every day | ORAL | 1 refills | Status: DC

## 2019-10-15 MED ORDER — CANAGLIFLOZIN 100 MG PO TABS: 100.0000 mg | ORAL_TABLET | Freq: Every day | ORAL | 1 refills | Status: DC

## 2019-10-15 MED ORDER — HYDROCHLOROTHIAZIDE 25 MG PO TABS: 25.0000 mg | ORAL_TABLET | Freq: Every day | ORAL | 1 refills | Status: DC

## 2019-10-15 MED ORDER — MELOXICAM 7.5 MG PO TABS: 7.5000 mg | ORAL_TABLET | Freq: Every day | ORAL | 3 refills | Status: DC

## 2019-10-15 MED ORDER — NOVOLIN 70/30 RELION (70-30) 100 UNIT/ML ~~LOC~~ SUSP: 15.0000 [IU] | Freq: Two times a day (BID) | SUBCUTANEOUS | 5 refills | Status: DC

## 2019-10-15 MED ORDER — DULOXETINE HCL 30 MG PO CPEP: 30.0000 mg | ORAL_CAPSULE | Freq: Every day | ORAL | 3 refills | Status: DC

## 2019-10-15 MED ORDER — ALPRAZOLAM 0.5 MG PO TABS: 0.5000 mg | ORAL_TABLET | Freq: Two times a day (BID) | ORAL | 1 refills | Status: DC | PRN

## 2019-10-15 MED ORDER — LEVOTHYROXINE SODIUM 200 MCG PO TABS: 200.0000 ug | ORAL_TABLET | Freq: Every day | ORAL | 1 refills | Status: DC

## 2019-10-15 NOTE — Patient Instructions (Signed)
Carbohydrate Counting for Diabetes Mellitus, Adult  Carbohydrate counting is a method of keeping track of how many carbohydrates you eat. Eating carbohydrates naturally increases the amount of sugar (glucose) in the blood. Counting how many carbohydrates you eat helps keep your blood glucose within normal limits, which helps you manage your diabetes (diabetes mellitus). It is important to know how many carbohydrates you can safely have in each meal. This is different for every person. A diet and nutrition specialist (registered dietitian) can help you make a meal plan and calculate how many carbohydrates you should have at each meal and snack. Carbohydrates are found in the following foods:  Grains, such as breads and cereals.  Dried beans and soy products.  Starchy vegetables, such as potatoes, peas, and corn.  Fruit and fruit juices.  Milk and yogurt.  Sweets and snack foods, such as cake, cookies, candy, chips, and soft drinks. How do I count carbohydrates? There are two ways to count carbohydrates in food. You can use either of the methods or a combination of both. Reading "Nutrition Facts" on packaged food The "Nutrition Facts" list is included on the labels of almost all packaged foods and beverages in the [place] It includes:  The serving size.  Information about nutrients in each serving, including the grams (g) of carbohydrate per serving. To use the "Nutrition Facts":  Decide how many servings you will have.  Multiply the number of servings by the number of carbohydrates per serving.  The resulting number is the total amount of carbohydrates that you will be having. Learning standard serving sizes of other foods When you eat carbohydrate foods that are not packaged or do not include "Nutrition Facts" on the label, you need to measure the servings in order to count the amount of carbohydrates:  Measure the foods that you will eat with a food scale or measuring cup, if needed.   Decide how many standard-size servings you will eat.  Multiply the number of servings by 15. Most carbohydrate-rich foods have about 15 g of carbohydrates per serving. ? For example, if you eat 8 oz (170 g) of strawberries, you will have eaten 2 servings and 30 g of carbohydrates (2 servings x 15 g = 30 g).  For foods that have more than one food mixed, such as soups and casseroles, you must count the carbohydrates in each food that is included. The following list contains standard serving sizes of common carbohydrate-rich foods. Each of these servings has about 15 g of carbohydrates:   hamburger bun or  English muffin.   oz (15 mL) syrup.   oz (14 g) jelly.  1 slice of bread.  1 six-inch tortilla.  3 oz (85 g) cooked rice or pasta.  4 oz (113 g) cooked dried beans.  4 oz (113 g) starchy vegetable, such as peas, corn, or potatoes.  4 oz (113 g) hot cereal.  4 oz (113 g) mashed potatoes or  of a large baked potato.  4 oz (113 g) canned or frozen fruit.  4 oz (120 mL) fruit juice.  4-6 crackers.  6 chicken nuggets.  6 oz (170 g) unsweetened dry cereal.  6 oz (170 g) plain fat-free yogurt or yogurt sweetened with artificial sweeteners.  8 oz (240 mL) milk.  8 oz (170 g) fresh fruit or one small piece of fruit.  24 oz (680 g) popped popcorn. Example of carbohydrate counting Sample meal  3 oz (85 g) chicken breast.  6 oz (170 g)   brown rice.  4 oz (113 g) corn.  8 oz (240 mL) milk.  8 oz (170 g) strawberries with sugar-free whipped topping. Carbohydrate calculation 1. Identify the foods that contain carbohydrates: ? Rice. ? Corn. ? Milk. ? Strawberries. 2. Calculate how many servings you have of each food: ? 2 servings rice. ? 1 serving corn. ? 1 serving milk. ? 1 serving strawberries. 3. Multiply each number of servings by 15 g: ? 2 servings rice x 15 g = 30 g. ? 1 serving corn x 15 g = 15 g. ? 1 serving milk x 15 g = 15 g. ? 1 serving  strawberries x 15 g = 15 g. 4. Add together all of the amounts to find the total grams of carbohydrates eaten: ? 30 g + 15 g + 15 g + 15 g = 75 g of carbohydrates total. Summary  Carbohydrate counting is a method of keeping track of how many carbohydrates you eat.  Eating carbohydrates naturally increases the amount of sugar (glucose) in the blood.  Counting how many carbohydrates you eat helps keep your blood glucose within normal limits, which helps you manage your diabetes.  A diet and nutrition specialist (registered dietitian) can help you make a meal plan and calculate how many carbohydrates you should have at each meal and snack. This information is not intended to replace advice given to you by your health care provider. Make sure you discuss any questions you have with your health care provider. Document Released: 10/31/2005 Document Revised: 05/25/2017 Document Reviewed: 04/13/2016 Elsevier Patient Education  2020 [employer]  

## 2019-10-16 LAB — MICROALBUMIN / CREATININE URINE RATIO
Creatinine, Urine: 80.9 mg/dL
Microalb/Creat Ratio: <4 mg/g creat (ref 0–29)
Microalbumin, Urine: <3 ug/mL

## 2019-10-16 LAB — LIPID PANEL
Chol/HDL Ratio: 4.1 ratio (ref 0.0–4.4)
Cholesterol, Total: 180 mg/dL (ref 100–199)
HDL: 44 mg/dL (ref >39)
LDL Chol Calc (NIH): 93 mg/dL (ref 0–99)
Triglycerides: 256 mg/dL — ABNORMAL HIGH (ref 0–149)
VLDL Cholesterol Cal: 43 mg/dL — ABNORMAL HIGH (ref 5–40)

## 2019-10-16 LAB — CBC WITH DIFFERENTIAL/PLATELET
Basophils Absolute: 0 10*3/uL (ref 0.0–0.2)
Basos: 0 %
EOS (ABSOLUTE): 0.2 10*3/uL (ref 0.0–0.4)
Eos: 2 %
Hematocrit: 41.9 % (ref 34.0–46.6)
Hemoglobin: 14.1 g/dL (ref 11.1–15.9)
Immature Grans (Abs): 0.1 10*3/uL (ref 0.0–0.1)
Immature Granulocytes: 1 %
Lymphocytes Absolute: 2.4 10*3/uL (ref 0.7–3.1)
Lymphs: 29 %
MCH: 31.1 pg (ref 26.6–33.0)
MCHC: 33.7 g/dL (ref 31.5–35.7)
MCV: 93 fL (ref 79–97)
Monocytes Absolute: 0.8 10*3/uL (ref 0.1–0.9)
Monocytes: 10 %
Neutrophils Absolute: 4.9 10*3/uL (ref 1.4–7.0)
Neutrophils: 58 %
Platelets: 269 10*3/uL (ref 150–450)
RBC: 4.53 x10E6/uL (ref 3.77–5.28)
RDW: 12.6 % (ref 11.7–15.4)
WBC: 8.4 10*3/uL (ref 3.4–10.8)

## 2019-10-16 LAB — CMP14+EGFR
ALT: 35 IU/L — ABNORMAL HIGH (ref 0–32)
AST: 30 IU/L (ref 0–40)
Albumin/Globulin Ratio: 1.7 (ref 1.2–2.2)
Albumin: 4.5 g/dL (ref 3.8–4.8)
Alkaline Phosphatase: 58 IU/L (ref 39–117)
BUN/Creatinine Ratio: 21 (ref 12–28)
BUN: 15 mg/dL (ref 8–27)
Bilirubin Total: 0.4 mg/dL (ref 0.0–1.2)
CO2: 23 mmol/L (ref 20–29)
Calcium: 9.4 mg/dL (ref 8.7–10.3)
Chloride: 102 mmol/L (ref 96–106)
Creatinine, Ser: 0.72 mg/dL (ref 0.57–1.00)
GFR calc Af Amer: 105 mL/min/{1.73_m2} (ref >59)
GFR calc non Af Amer: 91 mL/min/{1.73_m2} (ref >59)
Globulin, Total: 2.7 g/dL (ref 1.5–4.5)
Glucose: 184 mg/dL — ABNORMAL HIGH (ref 65–99)
Potassium: 3.9 mmol/L (ref 3.5–5.2)
Sodium: 141 mmol/L (ref 134–144)
Total Protein: 7.2 g/dL (ref 6.0–8.5)

## 2019-10-16 LAB — TSH: TSH: 3.6 u[IU]/mL (ref 0.450–4.500)

## 2019-10-18 NOTE — Progress Notes (Signed)
BP 135/81    Pulse 74    Temp 98.5 F (36.9 C) (Temporal)    Ht '5\' 7"'  (1.702 m)    Wt (!) 310 lb 3.2 oz (140.7 kg)    SpO2 95%    BMI 48.58 kg/m    Subjective:    Patient ID: Cynthia Espinoza, female    DOB: 02-26-1956, 63 y.o.   MRN: [id number]  HPI: Cynthia Espinoza is a 63 y.o. female presenting on 10/15/2019 for Medical Management of Chronic Issues and Diabetes  On hypertension, diabetes which was type I but improving to the point that we hope to reduce her insulin altogether.  She was awarded her disability finally.  She was very relieved and excited about this.  Her clinical course has been stable. Insulin dosage review with Alexie suggested compliance all of the time. Associated symptoms of hyperglycemia have been none.  Associated symptoms of hypoglycemia have been none.   She is currently taking NPH 70/30 10-15 units pre-breakfast, , 15 units pre-dinner,  Insulin injections are given by patient.   Compliance with blood glucose monitoring: excellent.  The patient does perform independently. Rotation of sites for injection: abdominal wall Exercise: rarely  Meal panning: She is using ADA exchanges. Blood glucose times and ranges:               Past Medical History:  Diagnosis Date   Anxiety    Depression    Hypertension    Thyroid disease    Relevant past medical, surgical, family and social history reviewed and updated as indicated. Interim medical history since our last visit reviewed. Allergies and medications reviewed and updated. DATA REVIEWED: CHART IN EPIC  Family History reviewed for pertinent findings.  Review of Systems  Constitutional: Negative.   HENT: Negative.   Eyes: Negative.   Respiratory: Negative.   Gastrointestinal: Negative.   Genitourinary: Negative.     Allergies as of 10/15/2019      Reactions   Metformin And Related    GI distress   Asa [aspirin] Rash   Penicillins Rash   Did it involve swelling of the face/tongue/throat, SOB, or  low BP? Yes Did it involve sudden or severe rash/hives, skin peeling, or any reaction on the inside of your mouth or nose? Yes Did you need to seek medical attention at a hospital or doctor's office? Yes When did it last happen?Over 10 years ago If all above answers are NO, may proceed with cephalosporin use.      Medication List       Accurate as of October 15, 2019 11:59 PM. If you have any questions, ask your nurse or doctor.        ALPRAZolam 0.5 MG tablet Commonly known as: XANAX Take 1 tablet (0.5 mg total) by mouth 2 (two) times daily as needed for anxiety.   amLODipine 10 MG tablet Commonly known as: NORVASC Take 1 tablet (10 mg total) by mouth daily.   benazepril 40 MG tablet Commonly known as: LOTENSIN Take 1 tablet (40 mg total) by mouth daily.   blood glucose meter kit and supplies Dispense based on patient and insurance preference. Use up to four times daily as directed. (FOR ICD-10 E10.9, E11.9).   canagliflozin 100 MG Tabs tablet Commonly known as: Invokana Take 1 tablet (100 mg total) by mouth daily before breakfast.   DULoxetine 30 MG capsule Commonly known as: Cymbalta Take 1 capsule (30 mg total) by mouth daily.   hydrochlorothiazide 25 MG  tablet Commonly known as: HYDRODIURIL Take 1 tablet (25 mg total) by mouth daily.   Insulin Pen Needle 29G X 5MM Misc 1 Units by Does not apply route 2 (two) times a day. Insulin needles for relion insulin pen.   levothyroxine 200 MCG tablet Commonly known as: SYNTHROID Take 1 tablet (200 mcg total) by mouth daily before breakfast. (repeat labs in Nov)   meloxicam 7.5 MG tablet Commonly known as: MOBIC Take 1 tablet (7.5 mg total) by mouth daily.   NovoLIN 70/30 ReliOn (70-30) 100 UNIT/ML injection Generic drug: insulin NPH-regular Human Inject 15-30 Units into the skin 2 (two) times daily with a meal.          Objective:    BP 135/81    Pulse 74    Temp 98.5 F (36.9 C) (Temporal)    Ht '5\' 7"'   (1.702 m)    Wt (!) 310 lb 3.2 oz (140.7 kg)    SpO2 95%    BMI 48.58 kg/m   Allergies  Allergen Reactions   Metformin And Related     GI distress    Asa [Aspirin] Rash   Penicillins Rash    Did it involve swelling of the face/tongue/throat, SOB, or low BP? Yes Did it involve sudden or severe rash/hives, skin peeling, or any reaction on the inside of your mouth or nose? Yes Did you need to seek medical attention at a hospital or doctor's office? Yes When did it last happen?Over 10 years ago If all above answers are NO, may proceed with cephalosporin use.     Wt Readings from Last 3 Encounters:  10/15/19 (!) 310 lb 3.2 oz (140.7 kg)  07/09/19 297 lb 9.6 oz (135 kg)  05/24/19 291 lb 6.4 oz (132.2 kg)    Physical Exam Constitutional:      General: She is not in acute distress.    Appearance: Normal appearance. She is well-developed.  HENT:     Head: Normocephalic and atraumatic.  Cardiovascular:     Rate and Rhythm: Normal rate.  Pulmonary:     Effort: Pulmonary effort is normal.  Skin:    General: Skin is warm and dry.     Findings: No rash.  Neurological:     Mental Status: She is alert and oriented to person, place, and time.     Deep Tendon Reflexes: Reflexes are normal and symmetric.     Results for orders placed or performed in visit on 10/15/19  hgba1c  Result Value Ref Range   HB A1C (BAYER DCA - WAIVED) 7.1 (H) <7.0 %  TSH  Result Value Ref Range   TSH 3.600 0.450 - 4.500 uIU/mL  Microalbumin / creatinine urine ratio  Result Value Ref Range   Creatinine, Urine 80.9 Not Estab. mg/dL   Microalbumin, Urine <3.0 Not Estab. ug/mL   Microalb/Creat Ratio <4 0 - 29 mg/g creat  Lipid Panel  Result Value Ref Range   Cholesterol, Total 180 100 - 199 mg/dL   Triglycerides 256 (H) 0 - 149 mg/dL   HDL 44 >39 mg/dL   VLDL Cholesterol Cal 43 (H) 5 - 40 mg/dL   LDL Chol Calc (NIH) 93 0 - 99 mg/dL   Chol/HDL Ratio 4.1 0.0 - 4.4 ratio  CBC with  Differential/Platelet  Result Value Ref Range   WBC 8.4 3.4 - 10.8 x10E3/uL   RBC 4.53 3.77 - 5.28 x10E6/uL   Hemoglobin 14.1 11.1 - 15.9 g/dL   Hematocrit 41.9 34.0 - 46.6 %  MCV 93 79 - 97 fL   MCH 31.1 26.6 - 33.0 pg   MCHC 33.7 31.5 - 35.7 g/dL   RDW 12.6 11.7 - 15.4 %   Platelets 269 150 - 450 x10E3/uL   Neutrophils 58 Not Estab. %   Lymphs 29 Not Estab. %   Monocytes 10 Not Estab. %   Eos 2 Not Estab. %   Basos 0 Not Estab. %   Neutrophils Absolute 4.9 1.4 - 7.0 x10E3/uL   Lymphocytes Absolute 2.4 0.7 - 3.1 x10E3/uL   Monocytes Absolute 0.8 0.1 - 0.9 x10E3/uL   EOS (ABSOLUTE) 0.2 0.0 - 0.4 x10E3/uL   Basophils Absolute 0.0 0.0 - 0.2 x10E3/uL   Immature Granulocytes 1 Not Estab. %   Immature Grans (Abs) 0.1 0.0 - 0.1 x10E3/uL  CMP14+EGFR  Result Value Ref Range   Glucose 184 (H) 65 - 99 mg/dL   BUN 15 8 - 27 mg/dL   Creatinine, Ser 0.72 0.57 - 1.00 mg/dL   GFR calc non Af Amer 91 >59 mL/min/1.73   GFR calc Af Amer 105 >59 mL/min/1.73   BUN/Creatinine Ratio 21 12 - 28   Sodium 141 134 - 144 mmol/L   Potassium 3.9 3.5 - 5.2 mmol/L   Chloride 102 96 - 106 mmol/L   CO2 23 20 - 29 mmol/L   Calcium 9.4 8.7 - 10.3 mg/dL   Total Protein 7.2 6.0 - 8.5 g/dL   Albumin 4.5 3.8 - 4.8 g/dL   Globulin, Total 2.7 1.5 - 4.5 g/dL   Albumin/Globulin Ratio 1.7 1.2 - 2.2   Bilirubin Total 0.4 0.0 - 1.2 mg/dL   Alkaline Phosphatase 58 39 - 117 IU/L   AST 30 0 - 40 IU/L   ALT 35 (H) 0 - 32 IU/L      Assessment & Plan:   1. Type 2 diabetes mellitus with other specified complication, without long-term current use of insulin (HCC) - hgba1c - POCT UA - Microalbumin - canagliflozin (INVOKANA) 100 MG TABS tablet; Take 1 tablet (100 mg total) by mouth daily before breakfast.  Dispense: 90 tablet; Refill: 1 - insulin NPH-regular Human (NOVOLIN 70/30 RELION) (70-30) 100 UNIT/ML injection; Inject 15-30 Units into the skin 2 (two) times daily with a meal.  Dispense: 18 mL; Refill: 5 -  CMP14+EGFR; Future - CBC with Differential/Platelet; Future - Lipid Panel; Future - Microalbumin / creatinine urine ratio; Future - Microalbumin / creatinine urine ratio - Lipid Panel - CBC with Differential/Platelet - CMP14+EGFR  2. Chronic pain of both knees - meloxicam (MOBIC) 7.5 MG tablet; Take 1 tablet (7.5 mg total) by mouth daily.  Dispense: 90 tablet; Refill: 3  3. Essential hypertension - hydrochlorothiazide (HYDRODIURIL) 25 MG tablet; Take 1 tablet (25 mg total) by mouth daily.  Dispense: 90 tablet; Refill: 1 - amLODipine (NORVASC) 10 MG tablet; Take 1 tablet (10 mg total) by mouth daily.  Dispense: 90 tablet; Refill: 1 - benazepril (LOTENSIN) 40 MG tablet; Take 1 tablet (40 mg total) by mouth daily.  Dispense: 90 tablet; Refill: 1 - CMP14+EGFR; Future - CBC with Differential/Platelet; Future - Lipid Panel; Future - Microalbumin / creatinine urine ratio; Future - Microalbumin / creatinine urine ratio - Lipid Panel - CBC with Differential/Platelet - CMP14+EGFR  4. Recurrent major depressive disorder, in partial remission (HCC) - ALPRAZolam (XANAX) 0.5 MG tablet; Take 1 tablet (0.5 mg total) by mouth 2 (two) times daily as needed for anxiety.  Dispense: 180 tablet; Refill: 1  5. Fibromyalgia - DULoxetine (CYMBALTA)  30 MG capsule; Take 1 capsule (30 mg total) by mouth daily.  Dispense: 90 capsule; Refill: 3  6. Hypothyroidism, unspecified type - levothyroxine (SYNTHROID) 200 MCG tablet; Take 1 tablet (200 mcg total) by mouth daily before breakfast. (repeat labs in Nov)  Dispense: 90 tablet; Refill: 1 - TSH; Future - TSH   Continue all other maintenance medications as listed above.  Follow up plan: Return in about 3 months (around 01/13/2020).  Educational handout given for carb counting  Terald Sleeper PA-C [facility] [address]  [place], [place] [address] [phone]

## 2019-10-31 ENCOUNTER — Ambulatory Visit: Payer: Self-pay | Admitting: Licensed Clinical Social Worker

## 2019-10-31 DIAGNOSIS — Z8659 Personal history of other mental and behavioral disorders: Secondary | ICD-10-CM

## 2019-10-31 DIAGNOSIS — I10 Essential (primary) hypertension: Secondary | ICD-10-CM

## 2019-10-31 DIAGNOSIS — E1169 Type 2 diabetes mellitus with other specified complication: Secondary | ICD-10-CM

## 2019-10-31 DIAGNOSIS — E039 Hypothyroidism, unspecified: Secondary | ICD-10-CM

## 2019-10-31 DIAGNOSIS — F3341 Major depressive disorder, recurrent, in partial remission: Secondary | ICD-10-CM

## 2019-10-31 NOTE — Patient Instructions (Addendum)
Licensed Clinical Social Worker Visit Information  Goals we discussed today:  Goals Addressed            This Visit's Progress   . Client wants to talk with LCSW about depression/stress issues facing client (pt-stated)       Current Barriers:  . Pain issues from fibromyalgia in client with Chronic Diagnoses of Type 2 DM, Hx PTSD, Major Depressive Disorder, HTN and Hypothyroidism . Finances Challenges . Has been working on [employer] but it is not approved yet. This is challenge area  Clinical Social Work Clinical Goal(s):  [name] Kitchen LCSW and client will talk in next 30 days about depression and stress issues faced by client  Interventions:  Previously talked with client about depression issues facing client   Talked with client about ambulation challenges of client  Talked with client about relaxation techniques of choice (watch TV, takes walks with use of walker, likes to talk with family or friends via phone)  Talked with client about her in home support with family  Talked with client about pain issues of client  Patient Self Care Activities:  . Self administers medications as prescribed . Attends all scheduled provider appointments   Plan:  Client to attend scheduled medical appointments Client to use relaxation techniques of choice to help her manage depression symptoms LCSW to call client in next 4 weeks to talk with clinet about mangement of depression symptoms .  Initial goal documentation         Materials Provided: No   Follow Up Plan: LCSW to call client in next 4 weeks to talk with client about management  of depression symptoms of client  The patient verbalized understanding of instructions provided today and declined a print copy of patient instruction materials.   Norva Riffle.Daianna Vasques MSW, LCSW Licensed Clinical Social Worker [facility] [phone]

## 2019-10-31 NOTE — Chronic Care Management (AMB) (Signed)
  Care Management Note   Cynthia Espinoza is a 63 y.o. year old female who is a primary care patient of Terald Sleeper, PA-C. The CM team was consulted for assistance with chronic disease management and care coordination.   I reached out to [name] by phone today.   Review of patient status, including review of consultants reports, relevant laboratory and other test results, and collaboration with appropriate care team members and the patient's provider was performed as part of comprehensive patient evaluation and provision of chronic care management services.   Social determinants of health: risk of tobacco use; risk of depression    Chronic Care Management from 05/15/2019 in [facility]  PHQ-9 Total Score  8     GAD 7 : Generalized Anxiety Score 05/15/2019  Nervous, Anxious, on Edge 2  Control/stop worrying 1  Worry too much - different things 1  Trouble relaxing 1  Restless 0  Easily annoyed or irritable 0  Afraid - awful might happen 1  Total GAD 7 Score 6  Anxiety Difficulty Somewhat difficult   Goals Addressed            This Visit's Progress   . Client wants to talk with LCSW about depression/stress issues facing client (pt-stated)       Current Barriers:  . Pain issues from fibromyalgia in client with Chronic Diagnoses of Type 2 DM, Hx PTSD, Major Depressive Disorder, HTN and Hypothyroidism . Finances Challenges . Has been working on [employer] but it is not approved yet. This is challenge area  Clinical Social Work Clinical Goal(s):  [name] Kitchen LCSW and client will talk in next 30 days about depression and stress issues faced by client  Interventions: . Previously talked with client about depression issues facing client  . Talked with client about ambulation challenges of client . Talked with client about relaxation techniques of choice (watch TV, takes walks with use of walker, likes to talk with family or friends via phone) . Talked  with client about her in home support with family . Talked with client about pain issues of client  Patient Self Care Activities:  . Self administers medications as prescribed . Attends all scheduled provider appointments   Plan:  Client to attend scheduled medical appointments Client to use relaxation techniques of choice to help her manage depression symptoms LCSW to call client in next 4 weeks to talk with clinet about mangement of depression symptoms .  Initial goal documentation       Follow Up Plan: LCSW to call client in next 4 weeks to talk with client about her managing depression symptoms faced  Norva Riffle.Randy Whitener MSW, LCSW Licensed Clinical Social Worker [facility] [phone]

## 2019-11-26 ENCOUNTER — Ambulatory Visit: Payer: Self-pay | Admitting: Licensed Clinical Social Worker

## 2019-11-26 DIAGNOSIS — E039 Hypothyroidism, unspecified: Secondary | ICD-10-CM

## 2019-11-26 DIAGNOSIS — Z8659 Personal history of other mental and behavioral disorders: Secondary | ICD-10-CM

## 2019-11-26 DIAGNOSIS — E1169 Type 2 diabetes mellitus with other specified complication: Secondary | ICD-10-CM

## 2019-11-26 DIAGNOSIS — F3341 Major depressive disorder, recurrent, in partial remission: Secondary | ICD-10-CM

## 2019-11-26 DIAGNOSIS — I10 Essential (primary) hypertension: Secondary | ICD-10-CM

## 2019-11-26 NOTE — Chronic Care Management (AMB) (Signed)
  Care Management Note   Cynthia Espinoza is a 64 y.o. year old female who is a primary care patient of Terald Sleeper, PA-C. The CM team was consulted for assistance with chronic disease management and care coordination.   I reached out to [name] by phone today.   Review of patient status, including review of consultants reports, relevant laboratory and other test results, and collaboration with appropriate care team members and the patient's provider was performed as part of comprehensive patient evaluation and provision of chronic care management services.   Social determinants of health: risk of tobacco use; risk of depression    Chronic Care Management from 05/15/2019 in [facility]  PHQ-9 Total Score  8     GAD 7 : Generalized Anxiety Score 05/15/2019  Nervous, Anxious, on Edge 2  Control/stop worrying 1  Worry too much - different things 1  Trouble relaxing 1  Restless 0  Easily annoyed or irritable 0  Afraid - awful might happen 1  Total GAD 7 Score 6  Anxiety Difficulty Somewhat difficult   Medications   (very important)  New medications from outside sources are available for reconciliation  ALPRAZolam (XANAX) 0.5 MG tablet amLODipine (NORVASC) 10 MG tablet benazepril (LOTENSIN) 40 MG tablet blood glucose meter kit and supplies canagliflozin (INVOKANA) 100 MG TABS tablet DULoxetine (CYMBALTA) 30 MG capsule hydrochlorothiazide (HYDRODIURIL) 25 MG tablet insulin NPH-regular Human (NOVOLIN 70/30 RELION) (70-30) 100 UNIT/ML injection(Expired) Insulin Pen Needle 29G X 5MM MISC levothyroxine (SYNTHROID) 200 MCG tablet meloxicam (MOBIC) 7.5 MG tablet  Goals    . Client wants to talk with LCSW about depression/stress issues facing client (pt-stated)     Current Barriers:  . Pain issues from fibromyalgia in client with Chronic Diagnoses of Type 2 DM, Hx PTSD, Major Depressive Disorder, HTN and Hypothyroidism . Finances Challenges . Has been  working on [employer] but it is not approved yet. This is challenge area  Clinical Social Work Clinical Goal(s):  [name] Kitchen LCSW and client will talk in next 30 days about depression and stress issues faced by client  Interventions:  Talked with client about depression issues facing client   Talked with client about ambulation challenges of client  Talked with client about relaxation techniques of choice (watch TV, takes walks with use of walker, likes to talk with family or friends via phone)  Talked with client about her in home support with family  Talked with client about pain issues of client . Talked with client about financial needs of client  Patient Self Care Activities:  . Self administers medications as prescribed . Attends all scheduled provider appointments   Plan:  Client to attend scheduled medical appointments Client to use relaxation techniques of choice to help her manage depression symptoms LCSW to call client in next 4 weeks to talk with clinet about mangement of depression symptoms .  Initial goal documentation      Follow Up Plan: LCSW to call client in next 4 weeks to talk with client about her management of depression symptoms faced  Norva Riffle.Silvana Holecek MSW, LCSW Licensed Clinical Social Worker [facility] [phone]

## 2019-11-26 NOTE — Patient Instructions (Addendum)
Licensed Clinical Social Worker Visit Information  Goals we discussed today:  Goals    . Client wants to talk with LCSW about depression/stress issues facing client (pt-stated)     Current Barriers:  . Pain issues from fibromyalgia in client with Chronic Diagnoses of Type 2 DM, Hx PTSD, Major Depressive Disorder, HTN and Hypothyroidism . Finances Challenges . Has been working on [employer] but it is not approved yet. This is challenge area  Clinical Social Work Clinical Goal(s):  [name] Kitchen LCSW and client will talk in next 30 days about depression and stress issues faced by client  Interventions: Talked with client about depression issues facing client  Talked with client about ambulation challenges of client  Talked with client about relaxation techniques of choice (watch TV, takes walks with use of walker, likes to talk with family or friends via phone)  Talked with client about her in home support with family  Talked with client about pain issues of client Talked with client about financial needs of client    Patient Self Care Activities:  . Self administers medications as prescribed . Attends all scheduled provider appointments   Plan:  Client to attend scheduled medical appointments Client to use relaxation techniques of choice to help her manage depression symptoms LCSW to call client in next 4 weeks to talk with clinet about mangement of depression symptoms .  Initial goal documentation         Materials Provided:  No  Follow Up Plan: LCSW to call client in next 4 weeks to talk with client about her management of depression symptoms faced  The patient verbalized understanding of instructions provided today and declined a print copy of patient instruction materials.   Kelton Pillar.Brodi Nery MSW, LCSW Licensed Clinical Social Worker [facility] [phone]

## 2019-12-26 ENCOUNTER — Ambulatory Visit: Payer: Self-pay | Admitting: Licensed Clinical Social Worker

## 2019-12-26 DIAGNOSIS — M797 Fibromyalgia: Secondary | ICD-10-CM

## 2019-12-26 DIAGNOSIS — E1169 Type 2 diabetes mellitus with other specified complication: Secondary | ICD-10-CM

## 2019-12-26 DIAGNOSIS — F3341 Major depressive disorder, recurrent, in partial remission: Secondary | ICD-10-CM

## 2019-12-26 DIAGNOSIS — E039 Hypothyroidism, unspecified: Secondary | ICD-10-CM

## 2019-12-26 DIAGNOSIS — Z8659 Personal history of other mental and behavioral disorders: Secondary | ICD-10-CM

## 2019-12-26 DIAGNOSIS — I10 Essential (primary) hypertension: Secondary | ICD-10-CM

## 2019-12-26 NOTE — Patient Instructions (Addendum)
Licensed Clinical Social Worker Visit Information  Goals we discussed today:  Goals    . Client wants to talk with LCSW about depression/stress issues facing client (pt-stated)     Current Barriers:  . Pain issues from fibromyalgia in client with Chronic Diagnoses of Type 2 DM, Hx PTSD, Major Depressive Disorder, HTN and Hypothyroidism . Finances Challenges . Has been working on [employer] but it is not approved yet. This is challenge area  Clinical Social Work Clinical Goal(s):  [name] Kitchen LCSW and client will talk in next 30 days about depression and stress issues faced by client  Interventions: Talked with client previously about depression issues facing client  Talked with client previously about ambulation challenges of client  Previously talked with client about relaxation techniques of choice (watch TV, takes walks with use of walker, likes to talk with family or friends via phone)  Previously talked with client about her in home support with family  Talked previously with client about pain issues of client Talked previously with client about financial needs of client   Patient Self Care Activities:  . Self administers medications as prescribed . Attends all scheduled provider appointments   Plan:  Client to attend scheduled medical appointments Client to use relaxation techniques of choice to help her manage depression symptoms LCSW to call client in next 4 weeks to talk with clinet about mangement of depression symptoms .  Initial goal documentation        Materials Provided:  No  Follow Up Plan: LCSW to call client in next 4 weeks to talk with client about her management of depression symptoms faced  The patient verbalized understanding of instructions provided today and declined a print copy of patient instruction materials.   Kelton Pillar.Kaylen Nghiem MSW, LCSW Licensed Clinical Social Worker [facility] [phone]

## 2019-12-26 NOTE — Chronic Care Management (AMB) (Signed)
  Care Management Note   Cynthia Espinoza is a 64 y.o. year old female who is a primary care patient of Terald Sleeper, PA-C. The CM team was consulted for assistance with chronic disease management and care coordination.   I reached out to [name] by phone today.     Review of patient status, including review of consultants reports, relevant laboratory and other test results, and collaboration with appropriate care team members and the patient's provider was performed as part of comprehensive patient evaluation and provision of chronic care management services.   Social determinants of health: risk of tobacco use; risk of depression    Chronic Care Management from 05/15/2019 in [place]  PHQ-9 Total Score  8     GAD 7 : Generalized Anxiety Score 05/15/2019  Nervous, Anxious, on Edge 2  Control/stop worrying 1  Worry too much - different things 1  Trouble relaxing 1  Restless 0  Easily annoyed or irritable 0  Afraid - awful might happen 1  Total GAD 7 Score 6  Anxiety Difficulty Somewhat difficult   Medications   (very important)  New medications from outside sources are available for reconciliation  ALPRAZolam (XANAX) 0.5 MG tablet amLODipine (NORVASC) 10 MG tablet benazepril (LOTENSIN) 40 MG tablet blood glucose meter kit and supplies canagliflozin (INVOKANA) 100 MG TABS tablet DULoxetine (CYMBALTA) 30 MG capsule hydrochlorothiazide (HYDRODIURIL) 25 MG tablet insulin NPH-regular Human (NOVOLIN 70/30 RELION) (70-30) 100 UNIT/ML injection(Expired) Insulin Pen Needle 29G X 5MM MISC levothyroxine (SYNTHROID) 200 MCG tablet meloxicam (MOBIC) 7.5 MG tablet  Goals    . Client wants to talk with LCSW about depression/stress issues facing client (pt-stated)     Current Barriers:  . Pain issues from fibromyalgia in client with Chronic Diagnoses of Type 2 DM, Hx PTSD, Major Depressive Disorder, HTN and Hypothyroidism . Finances Challenges . Has been  working on [employer] but it is not approved yet. This is challenge area  Clinical Social Work Clinical Goal(s):  [name] Kitchen LCSW and client will talk in next 30 days about depression and stress issues faced by client  Interventions:  Talked with client previouslyabout depression issues facing client   Talked with client previously aboutambulation challenges of client  Previously talked with client about relaxation techniques of choice (watch TV, takes walks with use of walker, likes to talk with family or friends via phone)  Previously talked with client about her in home support with family  Talked previously with client about pain issues of client . Talked previously with client about financial needs of client    Patient Self Care Activities:  . Self administers medications as prescribed . Attends all scheduled provider appointments   Plan:  Client to attend scheduled medical appointments Client to use relaxation techniques of choice to help her manage depression symptoms LCSW to call client in next 4 weeks to talk with clinet about mangement of depression symptoms .  Initial goal documentation       Follow Up Plan: LCSW to call client in next 4 weeks to talk with client about her management of depression symptoms faced  Norva Riffle.Ladarien Beeks MSW, LCSW Licensed Clinical Social Worker [facility] Care [facility] [phone]

## 2020-01-14 ENCOUNTER — Ambulatory Visit: Payer: BC Managed Care – PPO | Admitting: Physician Assistant

## 2020-01-22 ENCOUNTER — Ambulatory Visit (INDEPENDENT_AMBULATORY_CARE_PROVIDER_SITE_OTHER): Payer: Medicare Other | Admitting: Licensed Clinical Social Worker

## 2020-01-22 DIAGNOSIS — I10 Essential (primary) hypertension: Secondary | ICD-10-CM | POA: Diagnosis not present

## 2020-01-22 DIAGNOSIS — F3341 Major depressive disorder, recurrent, in partial remission: Secondary | ICD-10-CM

## 2020-01-22 DIAGNOSIS — E039 Hypothyroidism, unspecified: Secondary | ICD-10-CM | POA: Diagnosis not present

## 2020-01-22 DIAGNOSIS — E1169 Type 2 diabetes mellitus with other specified complication: Secondary | ICD-10-CM

## 2020-01-22 DIAGNOSIS — Z8659 Personal history of other mental and behavioral disorders: Secondary | ICD-10-CM

## 2020-01-22 NOTE — Chronic Care Management (AMB) (Signed)
  Care Management Note   Cynthia Espinoza is a 64 y.o. year old female who is a primary care patient of Terald Sleeper, PA-C. The CM team was consulted for assistance with chronic disease management and care coordination.   I reached out to [name] by phone today.    Review of patient status, including review of consultants reports, relevant laboratory and other test results, and collaboration with appropriate care team members and the patient's provider was performed as part of comprehensive patient evaluation and provision of chronic care management services.   Social determinants of health: risk of tobacco use; risk of depression    Chronic Care Management from 05/15/2019 in [place]  PHQ-9 Total Score  8     GAD 7 : Generalized Anxiety Score 05/15/2019  Nervous, Anxious, on Edge 2  Control/stop worrying 1  Worry too much - different things 1  Trouble relaxing 1  Restless 0  Easily annoyed or irritable 0  Afraid - awful might happen 1  Total GAD 7 Score 6  Anxiety Difficulty Somewhat difficult   Medications   (very important)  New medications from outside sources are available for reconciliation  ALPRAZolam (XANAX) 0.5 MG tablet amLODipine (NORVASC) 10 MG tablet benazepril (LOTENSIN) 40 MG tablet blood glucose meter kit and supplies canagliflozin (INVOKANA) 100 MG TABS tablet DULoxetine (CYMBALTA) 30 MG capsule hydrochlorothiazide (HYDRODIURIL) 25 MG tablet insulin NPH-regular Human (NOVOLIN 70/30 RELION) (70-30) 100 UNIT/ML injection(Expired) Insulin Pen Needle 29G X 5MM MISC levothyroxine (SYNTHROID) 200 MCG tablet meloxicam (MOBIC) 7.5 MG tablet  Goals    . Client wants to talk with LCSW about depression/stress issues facing client (pt-stated)     Current Barriers:  . Pain issues from fibromyalgia in client with Chronic Diagnoses of Type 2 DM, Hx PTSD, Major Depressive Disorder, HTN and Hypothyroidism . Finances Challenges . Has been  working on [employer] but it is not approved yet. This is challenge area  Clinical Social Work Clinical Goal(s):  [name] Kitchen LCSW and client will talk in next 30 days about depression and stress issues faced by client  Interventions:   [place] client about depression issues facing client   Talked with client aboutambulation challenges of client  Talked with client about relaxation techniques of choice (watch TV, takes walks with use of walker, likes to talk with family or friends via phone)  Talked with client about her in home support with family  Talked with client about pain issues of client . Talked with client about financial needs of client  . Talked with client about support from her church . Provided counseling support for client   Patient Self Care Activities:  . Self administers medications as prescribed . Attends all scheduled provider appointments   Plan:  Client to attend scheduled medical appointments Client to use relaxation techniques of choice to help her manage depression symptoms LCSW to call client in next 4 weeks to talk with clinet about mangement of depression symptoms .  Initial goal documentation       Follow Up Plan: LCSW to call client in next 4 weeks to talk with client about management of depression symptoms.  Norva Riffle.Loren Vicens MSW, LCSW Licensed Clinical Social Worker [facility] [phone]

## 2020-01-22 NOTE — Patient Instructions (Addendum)
Licensed Clinical Social Worker Visit Information  Goals we discussed today:  Goals    . Client wants to talk with LCSW about depression/stress issues facing client (pt-stated)     Current Barriers:  . Pain issues from fibromyalgia in client with Chronic Diagnoses of Type 2 DM, Hx PTSD, Major Depressive Disorder, HTN and Hypothyroidism . Finances Challenges . Has been working on [employer] but it is not approved yet. This is challenge area  Clinical Social Work Clinical Goal(s):  [name] Kitchen LCSW and client will talk in next 30 days about depression and stress issues faced by client  Interventions:   Talked with client about depression issues facing client  Talked with client about ambulation challenges of client  Talked with client about relaxation techniques of choice (watch TV, takes walks with use of walker, likes to talk with family or friends via phone)  Talked with client about her in home support with family  Talked with client about pain issues of client Talked with client about financial needs of client  Talked with client about support from her church  Provided counseling support for client   Patient Self Care Activities:  . Self administers medications as prescribed . Attends all scheduled provider appointments   Plan:  Client to attend scheduled medical appointments Client to use relaxation techniques of choice to help her manage depression symptoms LCSW to call client in next 4 weeks to talk with clinet about mangement of depression symptoms .  Initial goal documentation      Materials Provided: No  Follow Up Plan: LCSW to call client in next 4 weeks to talk with client about client management of depression symptoms faced  The patient verbalized understanding of instructions provided today and declined a print copy of patient instruction materials.   Kelton Pillar.Yahmir Sokolov MSW, LCSW Licensed Clinical Social Worker [facility] Care  Management [phone]

## 2020-01-27 ENCOUNTER — Telehealth: Payer: Self-pay | Admitting: Physician Assistant

## 2020-01-27 NOTE — Telephone Encounter (Signed)
Does she need labs before? Please advise

## 2020-01-28 NOTE — Telephone Encounter (Signed)
Ok to do on day of appointment this time

## 2020-01-28 NOTE — Telephone Encounter (Signed)
Pt aware.

## 2020-02-03 ENCOUNTER — Ambulatory Visit: Payer: Medicaid Other | Admitting: Physician Assistant

## 2020-02-04 ENCOUNTER — Ambulatory Visit (INDEPENDENT_AMBULATORY_CARE_PROVIDER_SITE_OTHER): Payer: Medicare Other | Admitting: Family

## 2020-02-04 ENCOUNTER — Other Ambulatory Visit: Payer: Self-pay

## 2020-02-04 ENCOUNTER — Encounter: Payer: Self-pay | Admitting: Family

## 2020-02-04 VITALS — BP 111/69 | HR 80 | Temp 99.3°F | Ht 67.0 in | Wt 318.2 lb

## 2020-02-04 DIAGNOSIS — Z794 Long term (current) use of insulin: Secondary | ICD-10-CM

## 2020-02-04 DIAGNOSIS — F3341 Major depressive disorder, recurrent, in partial remission: Secondary | ICD-10-CM | POA: Diagnosis not present

## 2020-02-04 DIAGNOSIS — Z1211 Encounter for screening for malignant neoplasm of colon: Secondary | ICD-10-CM

## 2020-02-04 DIAGNOSIS — M5442 Lumbago with sciatica, left side: Secondary | ICD-10-CM

## 2020-02-04 DIAGNOSIS — M25562 Pain in left knee: Secondary | ICD-10-CM

## 2020-02-04 DIAGNOSIS — M25561 Pain in right knee: Secondary | ICD-10-CM

## 2020-02-04 DIAGNOSIS — E039 Hypothyroidism, unspecified: Secondary | ICD-10-CM

## 2020-02-04 DIAGNOSIS — M5441 Lumbago with sciatica, right side: Secondary | ICD-10-CM

## 2020-02-04 DIAGNOSIS — I10 Essential (primary) hypertension: Secondary | ICD-10-CM | POA: Diagnosis not present

## 2020-02-04 DIAGNOSIS — E119 Type 2 diabetes mellitus without complications: Secondary | ICD-10-CM

## 2020-02-04 DIAGNOSIS — Z1212 Encounter for screening for malignant neoplasm of rectum: Secondary | ICD-10-CM

## 2020-02-04 DIAGNOSIS — F331 Major depressive disorder, recurrent, moderate: Secondary | ICD-10-CM

## 2020-02-04 DIAGNOSIS — F1320 Sedative, hypnotic or anxiolytic dependence, uncomplicated: Secondary | ICD-10-CM

## 2020-02-04 DIAGNOSIS — G8929 Other chronic pain: Secondary | ICD-10-CM

## 2020-02-04 DIAGNOSIS — E1169 Type 2 diabetes mellitus with other specified complication: Secondary | ICD-10-CM

## 2020-02-04 DIAGNOSIS — Z79899 Other long term (current) drug therapy: Secondary | ICD-10-CM

## 2020-02-04 DIAGNOSIS — M159 Polyosteoarthritis, unspecified: Secondary | ICD-10-CM

## 2020-02-04 DIAGNOSIS — M797 Fibromyalgia: Secondary | ICD-10-CM

## 2020-02-04 LAB — BAYER DCA HB A1C WAIVED: HB A1C (BAYER DCA - WAIVED): 8.7 % — ABNORMAL HIGH (ref <7.0)

## 2020-02-04 MED ORDER — ALPRAZOLAM 0.5 MG PO TABS: 0.5000 mg | ORAL_TABLET | Freq: Every evening | ORAL | 4 refills | Status: DC | PRN

## 2020-02-04 MED ORDER — BENAZEPRIL HCL 40 MG PO TABS: 40.0000 mg | ORAL_TABLET | Freq: Every day | ORAL | 1 refills | Status: DC

## 2020-02-04 MED ORDER — MELOXICAM 7.5 MG PO TABS: 7.5000 mg | ORAL_TABLET | Freq: Every day | ORAL | 3 refills | Status: DC

## 2020-02-04 MED ORDER — NOVOLIN 70/30 RELION (70-30) 100 UNIT/ML ~~LOC~~ SUSP: 15.0000 [IU] | Freq: Two times a day (BID) | SUBCUTANEOUS | 5 refills | Status: DC

## 2020-02-04 MED ORDER — HYDROCHLOROTHIAZIDE 25 MG PO TABS: 25.0000 mg | ORAL_TABLET | Freq: Every day | ORAL | 1 refills | Status: DC

## 2020-02-04 MED ORDER — LEVOTHYROXINE SODIUM 200 MCG PO TABS: 200.0000 ug | ORAL_TABLET | Freq: Every day | ORAL | 1 refills | Status: DC

## 2020-02-04 MED ORDER — AMLODIPINE BESYLATE 10 MG PO TABS: 10.0000 mg | ORAL_TABLET | Freq: Every day | ORAL | 1 refills | Status: DC

## 2020-02-04 NOTE — Progress Notes (Signed)
Subjective:    Patient ID: Cynthia Espinoza, female    DOB: [birth year], 64 y.o.   MRN: [id number]  Chief Complaint  Patient presents with  . Medical Management of Chronic Issues    [name]    Pt presents to the office today for chronic follow up.  Diabetes She presents for her follow-up diabetic visit. She has type 2 diabetes mellitus. Hypoglycemia symptoms include nervousness/anxiousness. Pertinent negatives for diabetes include no blurred vision, no fatigue, no foot paresthesias and no foot ulcerations. Symptoms are stable. Pertinent negatives for diabetic complications include no CVA, heart disease or nephropathy. Risk factors for coronary artery disease include dyslipidemia, diabetes mellitus, hypertension, sedentary lifestyle and post-menopausal. She is following a generally unhealthy diet. Her overall blood glucose range is 130-140 mg/dl. Eye exam is not current.  Hypertension This is a chronic problem. The current episode started more than 1 year ago. The problem has been resolved since onset. The problem is controlled. Associated symptoms include anxiety. Pertinent negatives include no blurred vision, malaise/fatigue, peripheral edema or shortness of breath. Risk factors for coronary artery disease include dyslipidemia, obesity and sedentary lifestyle. The current treatment provides moderate improvement. There is no history of CVA. Identifiable causes of hypertension include a thyroid problem.  Back Pain This is a chronic problem. The current episode started more than 1 year ago. The problem occurs intermittently. The pain is present in the lumbar spine. The quality of the pain is described as aching. The pain is at a severity of 10/10. The pain is moderate. She has tried NSAIDs for the symptoms.  Arthritis Presents for follow-up visit. She complains of pain and stiffness. The symptoms have been stable. Affected locations include the right MCP, left MCP, right knee and left knee.  Pertinent negatives include no fatigue.  Depression        This is a chronic problem.  The current episode started more than 1 year ago.   The onset quality is gradual.   The problem occurs intermittently.  The problem has been waxing and waning since onset.  Associated symptoms include decreased concentration, irritable, restlessness, decreased interest and sad.  Associated symptoms include no fatigue, no helplessness and no hopelessness.  Past treatments include SNRIs - Serotonin and norepinephrine reuptake inhibitors.  Past medical history includes thyroid problem and anxiety.   Thyroid Problem Presents for follow-up visit. Symptoms include anxiety and dry skin. Patient reports no constipation, depressed mood or fatigue. The symptoms have been stable.  Anxiety Presents for follow-up visit. Symptoms include decreased concentration, excessive worry, irritability, nervous/anxious behavior and restlessness. Patient reports no depressed mood or shortness of breath. Symptoms occur occasionally. The severity of symptoms is moderate.        Review of Systems  Constitutional: Positive for irritability. Negative for fatigue and malaise/fatigue.  Eyes: Negative for blurred vision.  Respiratory: Negative for shortness of breath.   Gastrointestinal: Negative for constipation.  Musculoskeletal: Positive for arthritis, back pain and stiffness.  Psychiatric/Behavioral: Positive for decreased concentration and depression. The patient is nervous/anxious.   All other systems reviewed and are negative.      Objective:   Physical Exam Vitals reviewed.  Constitutional:      General: She is irritable. She is not in acute distress.    Appearance: She is well-developed. She is obese.  HENT:     Head: Normocephalic and atraumatic.     Right Ear: Tympanic membrane normal.     Left Ear: Tympanic membrane normal.  Eyes:  Pupils: Pupils are equal, round, and reactive to light.  Neck:     Thyroid: No  thyromegaly.  Cardiovascular:     Rate and Rhythm: Normal rate and regular rhythm.     Heart sounds: Normal heart sounds. No murmur.  Pulmonary:     Effort: Pulmonary effort is normal. No respiratory distress.     Breath sounds: Normal breath sounds. No wheezing.  Abdominal:     General: Bowel sounds are normal. There is no distension.     Palpations: Abdomen is soft.     Tenderness: There is no abdominal tenderness.  Musculoskeletal:        General: No tenderness. Normal range of motion.     Cervical back: Normal range of motion and neck supple.     Right lower leg: Edema (trace) present.     Left lower leg: Edema (trace) present.  Skin:    General: Skin is warm and dry.  Neurological:     Mental Status: She is alert and oriented to person, place, and time.     Cranial Nerves: No cranial nerve deficit.     Deep Tendon Reflexes: Reflexes are normal and symmetric.  Psychiatric:        Behavior: Behavior normal.        Thought Content: Thought content normal.        Judgment: Judgment normal.       BP 111/69   Pulse 80   Temp 99.3 F (37.4 C) (Temporal)   Ht '5\' 7"'  (1.702 m)   Wt (!) 318 lb 3.2 oz (144.3 kg)   SpO2 96%   BMI 49.84 kg/m      Assessment & Plan:  LADESHA PACINI comes in today with chief complaint of Medical Management of Chronic Issues [facility] Patient )   Diagnosis and orders addressed:  1. Recurrent major depressive disorder, in partial remission (HCC) - ALPRAZolam (XANAX) 0.5 MG tablet; Take 1 tablet (0.5 mg total) by mouth at bedtime as needed for anxiety.  Dispense: 20 tablet; Refill: 4 - CMP14+EGFR - CBC with Differential/Platelet  2. Chronic pain of both knees - meloxicam (MOBIC) 7.5 MG tablet; Take 1 tablet (7.5 mg total) by mouth daily.  Dispense: 90 tablet; Refill: 3 - CMP14+EGFR - CBC with Differential/Platelet  3. Essential hypertension - amLODipine (NORVASC) 10 MG tablet; Take 1 tablet (10 mg total) by mouth daily.  Dispense: 90 tablet;  Refill: 1 - benazepril (LOTENSIN) 40 MG tablet; Take 1 tablet (40 mg total) by mouth daily.  Dispense: 90 tablet; Refill: 1 - hydrochlorothiazide (HYDRODIURIL) 25 MG tablet; Take 1 tablet (25 mg total) by mouth daily.  Dispense: 90 tablet; Refill: 1 - CMP14+EGFR - CBC with Differential/Platelet  4. Fibromyalgia - meloxicam (MOBIC) 7.5 MG tablet; Take 1 tablet (7.5 mg total) by mouth daily.  Dispense: 90 tablet; Refill: 3 - CMP14+EGFR - CBC with Differential/Platelet  5. Hypothyroidism, unspecified type - levothyroxine (SYNTHROID) 200 MCG tablet; Take 1 tablet (200 mcg total) by mouth daily before breakfast. (repeat labs in Nov)  Dispense: 90 tablet; Refill: 1 - CMP14+EGFR - CBC with Differential/Platelet  6. Type 2 diabetes mellitus with other specified complication, without long-term current use of insulin (HCC) - insulin NPH-regular Human (NOVOLIN 70/30 RELION) (70-30) 100 UNIT/ML injection; Inject 15-30 Units into the skin 2 (two) times daily with a meal.  Dispense: 18 mL; Refill: 5 - CMP14+EGFR - CBC with Differential/Platelet - Bayer DCA Hb A1c Waived  7. Diabetes mellitus without complication ([place]) -  CMP14+EGFR - CBC with Differential/Platelet  8. Chronic bilateral low back pain with bilateral sciatica - CMP14+EGFR - CBC with Differential/Platelet  9. Controlled substance agreement signed - ToxASSURE Select 13 (MW), Urine - ALPRAZolam (XANAX) 0.5 MG tablet; Take 1 tablet (0.5 mg total) by mouth at bedtime as needed for anxiety.  Dispense: 20 tablet; Refill: 4 - CMP14+EGFR - CBC with Differential/Platelet  10. Benzodiazepine dependence (HCC) - ToxASSURE Select 13 (MW), Urine - ALPRAZolam (XANAX) 0.5 MG tablet; Take 1 tablet (0.5 mg total) by mouth at bedtime as needed for anxiety.  Dispense: 20 tablet; Refill: 4 - CMP14+EGFR - CBC with Differential/Platelet  11. Colon cancer screening - Bayer DCA Hb A1c Waived - Cologuard  12. Screening for malignant neoplasm of the  rectum - Bayer DCA Hb A1c Waived - Cologuard  13. Type 2 diabetes mellitus with other specified complication, with long-term current use of insulin ([place])   14. Morbid obesity ([place])  15. Moderate episode of recurrent major depressive disorder ([place])   16. Generalized OA   Labs pending Health Maintenance reviewed Diet and exercise encouraged  Follow up plan: 3 months   Evelina Dun, FNP

## 2020-02-04 NOTE — Patient Instructions (Signed)
Health Maintenance, Female Adopting a healthy lifestyle and getting preventive care are important in promoting health and wellness. Ask your health care provider about:  The right schedule for you to have regular tests and exams.  Things you can do on your own to prevent diseases and keep yourself healthy. What should I know about diet, weight, and exercise? Eat a healthy diet   Eat a diet that includes plenty of vegetables, fruits, low-fat dairy products, and lean protein.  Do not eat a lot of foods that are high in solid fats, added sugars, or sodium. Maintain a healthy weight Body mass index (BMI) is used to identify weight problems. It estimates body fat based on height and weight. Your health care provider can help determine your BMI and help you achieve or maintain a healthy weight. Get regular exercise Get regular exercise. This is one of the most important things you can do for your health. Most adults should:  Exercise for at least 150 minutes each week. The exercise should increase your heart rate and make you sweat (moderate-intensity exercise).  Do strengthening exercises at least twice a week. This is in addition to the moderate-intensity exercise.  Spend less time sitting. Even light physical activity can be beneficial. Watch cholesterol and blood lipids Have your blood tested for lipids and cholesterol at 62 years of age, then have this test every 5 years. Have your cholesterol levels checked more often if:  Your lipid or cholesterol levels are high.  You are older than 62 years of age.  You are at high risk for heart disease. What should I know about cancer screening? Depending on your health history and family history, you may need to have cancer screening at various ages. This may include screening for:  Breast cancer.  Cervical cancer.  Colorectal cancer.  Skin cancer.  Lung cancer. What should I know about heart disease, diabetes, and high blood  pressure? Blood pressure and heart disease  High blood pressure causes heart disease and increases the risk of stroke. This is more likely to develop in people who have high blood pressure readings, are of African descent, or are overweight.  Have your blood pressure checked: ? Every 3-5 years if you are 18-39 years of age. ? Every year if you are 40 years old or older. Diabetes Have regular diabetes screenings. This checks your fasting blood sugar level. Have the screening done:  Once every three years after age 40 if you are at a normal weight and have a low risk for diabetes.  More often and at a younger age if you are overweight or have a high risk for diabetes. What should I know about preventing infection? Hepatitis B If you have a higher risk for hepatitis B, you should be screened for this virus. Talk with your health care provider to find out if you are at risk for hepatitis B infection. Hepatitis C Testing is recommended for:  Everyone born from 1945 through 1965.  Anyone with known risk factors for hepatitis C. Sexually transmitted infections (STIs)  Get screened for STIs, including gonorrhea and chlamydia, if: ? You are sexually active and are younger than 62 years of age. ? You are older than 62 years of age and your health care provider tells you that you are at risk for this type of infection. ? Your sexual activity has changed since you were last screened, and you are at increased risk for chlamydia or gonorrhea. Ask your health care provider if   you are at risk.  Ask your health care provider about whether you are at high risk for HIV. Your health care provider may recommend a prescription medicine to help prevent HIV infection. If you choose to take medicine to prevent HIV, you should first get tested for HIV. You should then be tested every 3 months for as long as you are taking the medicine. Pregnancy  If you are about to stop having your period (premenopausal) and  you may become pregnant, seek counseling before you get pregnant.  Take 400 to 800 micrograms (mcg) of folic acid every day if you become pregnant.  Ask for birth control (contraception) if you want to prevent pregnancy. Osteoporosis and menopause Osteoporosis is a disease in which the bones lose minerals and strength with aging. This can result in bone fractures. If you are 65 years old or older, or if you are at risk for osteoporosis and fractures, ask your health care provider if you should:  Be screened for bone loss.  Take a calcium or vitamin D supplement to lower your risk of fractures.  Be given hormone replacement therapy (HRT) to treat symptoms of menopause. Follow these instructions at home: Lifestyle  Do not use any products that contain nicotine or tobacco, such as cigarettes, e-cigarettes, and chewing tobacco. If you need help quitting, ask your health care provider.  Do not use street drugs.  Do not share needles.  Ask your health care provider for help if you need support or information about quitting drugs. Alcohol use  Do not drink alcohol if: ? Your health care provider tells you not to drink. ? You are pregnant, may be pregnant, or are planning to become pregnant.  If you drink alcohol: ? Limit how much you use to 0-1 drink a day. ? Limit intake if you are breastfeeding.  Be aware of how much alcohol is in your drink. In the [place] one drink equals one 12 oz bottle of beer (355 mL), one 5 oz glass of wine (148 mL), or one 1 oz glass of hard liquor (44 mL). General instructions  Schedule regular health, dental, and eye exams.  Stay current with your vaccines.  Tell your health care provider if: ? You often feel depressed. ? You have ever been abused or do not feel safe at home. Summary  Adopting a healthy lifestyle and getting preventive care are important in promoting health and wellness.  Follow your health care provider's instructions about healthy  diet, exercising, and getting tested or screened for diseases.  Follow your health care provider's instructions on monitoring your cholesterol and blood pressure. This information is not intended to replace advice given to you by your health care provider. Make sure you discuss any questions you have with your health care provider. Document Revised: 10/24/2018 Document Reviewed: 10/24/2018 Elsevier Patient Education  2020 [employer] Inc.  

## 2020-02-05 LAB — CMP14+EGFR
ALT: 50 IU/L — ABNORMAL HIGH (ref 0–32)
AST: 49 IU/L — ABNORMAL HIGH (ref 0–40)
Albumin/Globulin Ratio: 1.4 (ref 1.2–2.2)
Albumin: 4.2 g/dL (ref 3.8–4.8)
Alkaline Phosphatase: 60 IU/L (ref 39–117)
BUN/Creatinine Ratio: 18 (ref 12–28)
BUN: 14 mg/dL (ref 8–27)
Bilirubin Total: 0.5 mg/dL (ref 0.0–1.2)
CO2: 23 mmol/L (ref 20–29)
Calcium: 9.3 mg/dL (ref 8.7–10.3)
Chloride: 101 mmol/L (ref 96–106)
Creatinine, Ser: 0.8 mg/dL (ref 0.57–1.00)
GFR calc Af Amer: 92 mL/min/{1.73_m2} (ref >59)
GFR calc non Af Amer: 80 mL/min/{1.73_m2} (ref >59)
Globulin, Total: 3 g/dL (ref 1.5–4.5)
Glucose: 228 mg/dL — ABNORMAL HIGH (ref 65–99)
Potassium: 4.2 mmol/L (ref 3.5–5.2)
Sodium: 140 mmol/L (ref 134–144)
Total Protein: 7.2 g/dL (ref 6.0–8.5)

## 2020-02-05 LAB — CBC WITH DIFFERENTIAL/PLATELET
Basophils Absolute: 0 10*3/uL (ref 0.0–0.2)
Basos: 0 %
EOS (ABSOLUTE): 0.2 10*3/uL (ref 0.0–0.4)
Eos: 2 %
Hematocrit: 42 % (ref 34.0–46.6)
Hemoglobin: 14.3 g/dL (ref 11.1–15.9)
Immature Grans (Abs): 0.1 10*3/uL (ref 0.0–0.1)
Immature Granulocytes: 1 %
Lymphocytes Absolute: 2.8 10*3/uL (ref 0.7–3.1)
Lymphs: 31 %
MCH: 31.6 pg (ref 26.6–33.0)
MCHC: 34 g/dL (ref 31.5–35.7)
MCV: 93 fL (ref 79–97)
Monocytes Absolute: 0.9 10*3/uL (ref 0.1–0.9)
Monocytes: 10 %
Neutrophils Absolute: 5.1 10*3/uL (ref 1.4–7.0)
Neutrophils: 56 %
Platelets: 242 10*3/uL (ref 150–450)
RBC: 4.52 x10E6/uL (ref 3.77–5.28)
RDW: 12.7 % (ref 11.7–15.4)
WBC: 9 10*3/uL (ref 3.4–10.8)

## 2020-02-06 ENCOUNTER — Other Ambulatory Visit: Payer: Self-pay | Admitting: Family

## 2020-02-07 ENCOUNTER — Telehealth: Payer: Self-pay | Admitting: Family Medicine

## 2020-02-07 ENCOUNTER — Telehealth: Payer: Self-pay | Admitting: Family

## 2020-02-07 ENCOUNTER — Other Ambulatory Visit: Payer: Self-pay | Admitting: Family

## 2020-02-07 DIAGNOSIS — R5383 Other fatigue: Secondary | ICD-10-CM

## 2020-02-07 DIAGNOSIS — E559 Vitamin D deficiency, unspecified: Secondary | ICD-10-CM

## 2020-02-07 MED ORDER — TRULICITY 0.75 MG/0.5ML ~~LOC~~ SOAJ: 0.7500 mg | SUBCUTANEOUS | 3 refills | Status: DC

## 2020-02-07 NOTE — Telephone Encounter (Signed)
Pt wants to speak with nurse to go over lab results.

## 2020-02-07 NOTE — Telephone Encounter (Signed)
Review in lab results

## 2020-02-11 ENCOUNTER — Other Ambulatory Visit: Payer: Self-pay | Admitting: Family

## 2020-02-13 NOTE — Telephone Encounter (Signed)
Left message- lab order requested has been placed.

## 2020-02-13 NOTE — Telephone Encounter (Signed)
Orders placed.

## 2020-02-21 ENCOUNTER — Ambulatory Visit (INDEPENDENT_AMBULATORY_CARE_PROVIDER_SITE_OTHER): Payer: Medicare Other | Admitting: Licensed Clinical Social Worker

## 2020-02-21 DIAGNOSIS — E1169 Type 2 diabetes mellitus with other specified complication: Secondary | ICD-10-CM

## 2020-02-21 DIAGNOSIS — F331 Major depressive disorder, recurrent, moderate: Secondary | ICD-10-CM

## 2020-02-21 DIAGNOSIS — E039 Hypothyroidism, unspecified: Secondary | ICD-10-CM | POA: Diagnosis not present

## 2020-02-21 DIAGNOSIS — Z8659 Personal history of other mental and behavioral disorders: Secondary | ICD-10-CM

## 2020-02-21 DIAGNOSIS — Z794 Long term (current) use of insulin: Secondary | ICD-10-CM

## 2020-02-21 DIAGNOSIS — I10 Essential (primary) hypertension: Secondary | ICD-10-CM | POA: Diagnosis not present

## 2020-02-21 NOTE — Chronic Care Management (AMB) (Addendum)
  Care Management Note   Cynthia Espinoza is a 64 y.o. year old female who is a primary care patient of [name], FNP. The CM team was consulted for assistance with chronic disease management and care coordination.   I reached out to [name] by phone today.     Review of patient status, including review of consultants reports, relevant laboratory and other test results, and collaboration with appropriate care team members and the patient's provider was performed as part of comprehensive patient evaluation and provision of chronic care management services.   Social determinants of health: risk of tobacco use; risk of depression    Office Visit from 02/04/2020 in [place]  PHQ-9 Total Score  5      GAD 7 : Generalized Anxiety Score 02/04/2020 05/15/2019  Nervous, Anxious, on Edge 1 2  Control/stop worrying 2 1  Worry too much - different things 2 1  Trouble relaxing 1 1  Restless 1 0  Easily annoyed or irritable 1 0  Afraid - awful might happen 1 1  Total GAD 7 Score 9 6  Anxiety Difficulty Not difficult at all Somewhat difficult   Medications    ALPRAZolam (XANAX) 0.5 MG tablet amLODipine (NORVASC) 10 MG tablet benazepril (LOTENSIN) 40 MG tablet blood glucose meter kit and supplies Dulaglutide (TRULICITY) 7.67 HA/1.9FX SOPN hydrochlorothiazide (HYDRODIURIL) 25 MG tablet insulin NPH-regular Human (NOVOLIN 70/30 RELION) (70-30) 100 UNIT/ML injection Insulin Pen Needle 29G X 5MM MISC levothyroxine (SYNTHROID) 200 MCG tablet meloxicam (MOBIC) 7.5 MG tablet  Goals      Client wants to talk with LCSW about depression/stress issues facing client (pt-stated)     Current Barriers:  Pain issues from fibromyalgia in client with Chronic Diagnoses of Type 2 DM, Hx PTSD, Major Depressive Disorder, HTN and Hypothyroidism Finances Challenges Has been working on [employer] but it is not approved yet. This is challenge area  Clinical Social  Work Clinical Goal(s):  LCSW and client will talk in next 30 days about depression and stress issues faced by client  Interventions:  Talked with client about depression issues facing client  Talked with client about ambulation challenges of client Talked with client about relaxation techniques of choice (watch TV, takes walks with use of walker, likes to talk with family or friends via phone) Talked with client about her in home support with family Talked with client about pain issues of client Talked with client about support from her church Provided counseling support for client     Patient Self Care Activities:  Self administers medications as prescribed Attends all scheduled provider appointments   Plan:  Client to attend scheduled medical appointments Client to use relaxation techniques of choice to help her manage depression symptoms LCSW to call client in next 4 weeks to talk with clinet about mangement of depression symptoms .  Initial goal documentation       Follow Up Plan: LCSW to call client in next 4 weeks to talk with client about her management of depression symptoms faced  Norva Riffle.Keisuke Hollabaugh MSW, LCSW Licensed Clinical Social Worker [facility] Care Management [phone]  I have reviewed and agree with the above documentation.   Evelina Dun, FNP

## 2020-02-21 NOTE — Patient Instructions (Addendum)
Licensed Clinical Social Worker Visit Information  Goals we discussed today:  Goals    . Client wants to talk with LCSW about depression/stress issues facing client (pt-stated)     Current Barriers:  . Pain issues from fibromyalgia in client with Chronic Diagnoses of Type 2 DM, Hx PTSD, Major Depressive Disorder, HTN and Hypothyroidism . Finances Challenges . Has been working on [employer] but it is not approved yet. This is challenge area  Clinical Social Work Clinical Goal(s):  [name] Kitchen LCSW and client will talk in next 30 days about depression and stress issues faced by client  Interventions: .Talked with client about depression issues facing client  Talked with client about ambulation challenges of client  Talked with client about relaxation techniques of choice (watch TV, takes walks with use of walker, likes to talk with family or friends via phone)  Talked with client about her in home support with family  Talked with client about pain issues of client Talked with client about support from her church Provided counseling support for client   Patient Self Care Activities:  . Self administers medications as prescribed . Attends all scheduled provider appointments   Plan:  Client to attend scheduled medical appointments Client to use relaxation techniques of choice to help her manage depression symptoms LCSW to call client in next 4 weeks to talk with clinet about mangement of depression symptoms .  Initial goal documentation       Materials Provided: No  Follow Up Plan: LCSW to call client in next 4 weeks to talk with client about management of depression symptoms of client  The patient verbalized understanding of instructions provided today and declined a print copy of patient instruction materials.   Kelton Pillar.Jean Alejos MSW, LCSW Licensed Clinical Social Worker [facility] Care [facility] [phone]

## 2020-03-24 ENCOUNTER — Telehealth: Payer: Self-pay

## 2020-04-10 ENCOUNTER — Telehealth: Payer: Self-pay | Admitting: *Deleted

## 2020-04-10 MED ORDER — NOVOLOG MIX 70/30 FLEXPEN (70-30) 100 UNIT/ML ~~LOC~~ SUPN: 15.0000 [IU] | PEN_INJECTOR | Freq: Two times a day (BID) | SUBCUTANEOUS | 11 refills | Status: DC

## 2020-04-10 NOTE — Telephone Encounter (Signed)
Novolin changed to Novolog. Prescription sent to pharmacy

## 2020-04-10 NOTE — Telephone Encounter (Signed)
Novolin 70/30 flexpen is not covered by insurance.   These are the covered formulary alternatives.   Novolog Mix 70-30 U-100 Insuln (Soln) tier-4 NOT Required* Novolog Mix 70-30Flexpen U-100 tier-7NOT Required* Novolog Flexpen U-100 Insulin tier-9 NOT Required*

## 2020-05-05 ENCOUNTER — Ambulatory Visit: Payer: Medicare Other | Admitting: Licensed Clinical Social Worker

## 2020-05-05 DIAGNOSIS — Z794 Long term (current) use of insulin: Secondary | ICD-10-CM

## 2020-05-05 DIAGNOSIS — E1169 Type 2 diabetes mellitus with other specified complication: Secondary | ICD-10-CM

## 2020-05-05 DIAGNOSIS — Z8659 Personal history of other mental and behavioral disorders: Secondary | ICD-10-CM

## 2020-05-05 DIAGNOSIS — I10 Essential (primary) hypertension: Secondary | ICD-10-CM

## 2020-05-05 DIAGNOSIS — F331 Major depressive disorder, recurrent, moderate: Secondary | ICD-10-CM

## 2020-05-05 DIAGNOSIS — E039 Hypothyroidism, unspecified: Secondary | ICD-10-CM

## 2020-05-05 NOTE — Patient Instructions (Addendum)
Licensed Clinical Social Worker Visit Information  Materials Provided: No   05/05/2020  Name: Cynthia Espinoza           MRN: [id number]       DOB: Aug 26, 1956  Cynthia Espinoza is a 64 y.o. year old female who is a primary care patient of Junie Spencer, FNP. The CCM team was consulted for assistance with [employer] .   Review of patient status, including review of consultants reports, other relevant assessments, and collaboration with appropriate care team members and the patient's provider was performed as part of comprehensive patient evaluation and provision of chronic care management services.    SDOH (Social Determinants of Health) assessments performed: No;risk for tobacco use; risk for depression; risk for stress  LCSW called client home phone number several times today but LCSW was not able to speak via phone with client today. However, LCSW did leave phone message requesting that [name] please return call to LCSW at [phone].   Follow Up Plan: LCSW to call client in next 4 weeks to talk with her about client management of depression symptoms  LCSW was not able to speak via phone with client today.Thus, the patient was not able to verbalize understanding of instructions provided today and was not able to accept or decline a print copy of patient instruction materials.   Kelton Pillar.Amillion Scobee MSW, LCSW Licensed Clinical Social Worker [facility] [phone]

## 2020-05-05 NOTE — Chronic Care Management (AMB) (Addendum)
  Chronic Care Management    Clinical Social Work Follow Up Note  05/05/2020 Name: Cynthia Espinoza MRN: [id number] DOB: 1955/11/17  Cynthia Espinoza is a 64 y.o. year old female who is a primary care patient of [name], FNP. The CCM team was consulted for assistance with [name] .   Review of patient status, including review of consultants reports, other relevant assessments, and collaboration with appropriate care team members and the patient's provider was performed as part of comprehensive patient evaluation and provision of chronic care management services.    SDOH (Social Determinants of Health) assessments performed: No;risk for tobacco use; risk for depression; risk for stress    Office Visit from 02/04/2020 in [place]  PHQ-9 Total Score 5      GAD 7 : Generalized Anxiety Score 02/04/2020 05/15/2019  Nervous, Anxious, on Edge 1 2  Control/stop worrying 2 1  Worry too much - different things 2 1  Trouble relaxing 1 1  Restless 1 0  Easily annoyed or irritable 1 0  Afraid - awful might happen 1 1  Total GAD 7 Score 9 6  Anxiety Difficulty Not difficult at all Somewhat difficult     Outpatient Encounter Medications as of 05/05/2020  Medication Sig   ALPRAZolam (XANAX) 0.5 MG tablet Take 1 tablet (0.5 mg total) by mouth at bedtime as needed for anxiety.   amLODipine (NORVASC) 10 MG tablet Take 1 tablet (10 mg total) by mouth daily.   benazepril (LOTENSIN) 40 MG tablet Take 1 tablet (40 mg total) by mouth daily.   blood glucose meter kit and supplies Dispense based on patient and insurance preference. Use up to four times daily as directed. (FOR ICD-10 E10.9, E11.9).   Dulaglutide (TRULICITY) 3.81 OF/7.5ZW SOPN Inject 0.75 mg into the skin once a week.   hydrochlorothiazide (HYDRODIURIL) 25 MG tablet Take 1 tablet (25 mg total) by mouth daily.   insulin aspart protamine - aspart (NOVOLOG MIX 70/30 FLEXPEN) (70-30) 100 UNIT/ML FlexPen Inject  0.15-0.3 mLs (15-30 Units total) into the skin 2 (two) times daily.   Insulin Pen Needle 29G X 5MM MISC 1 Units by Does not apply route 2 (two) times a day. Insulin needles for relion insulin pen.   levothyroxine (SYNTHROID) 200 MCG tablet Take 1 tablet (200 mcg total) by mouth daily before breakfast. (repeat labs in Nov)   meloxicam (MOBIC) 7.5 MG tablet Take 1 tablet (7.5 mg total) by mouth daily.   No facility-administered encounter medications on file as of 05/05/2020.    LCSW called client home phone number several times today but LCSW was not able to speak via phone with client today. However, LCSW did leave phone message requesting that Cynthia Espinoza please return call to LCSW at [phone].   Follow Up Plan: LCSW to call client in next 4 weeks to talk with her about client management of depression symptoms  Norva Riffle.Rembert Browe MSW, LCSW Licensed Clinical Social Worker [facility] Care Management [phone]  I have reviewed the CCM documentation and agree with the written assessment and plan of care.  Evelina Dun, FNP

## 2020-05-08 ENCOUNTER — Ambulatory Visit: Payer: Medicare Other | Admitting: Family

## 2020-06-10 ENCOUNTER — Ambulatory Visit (INDEPENDENT_AMBULATORY_CARE_PROVIDER_SITE_OTHER): Payer: Medicare Other | Admitting: Licensed Clinical Social Worker

## 2020-06-10 DIAGNOSIS — E1169 Type 2 diabetes mellitus with other specified complication: Secondary | ICD-10-CM

## 2020-06-10 DIAGNOSIS — Z8659 Personal history of other mental and behavioral disorders: Secondary | ICD-10-CM

## 2020-06-10 DIAGNOSIS — F331 Major depressive disorder, recurrent, moderate: Secondary | ICD-10-CM | POA: Diagnosis not present

## 2020-06-10 DIAGNOSIS — I10 Essential (primary) hypertension: Secondary | ICD-10-CM

## 2020-06-10 DIAGNOSIS — E039 Hypothyroidism, unspecified: Secondary | ICD-10-CM

## 2020-06-10 DIAGNOSIS — Z794 Long term (current) use of insulin: Secondary | ICD-10-CM

## 2020-06-10 NOTE — Chronic Care Management (AMB) (Addendum)
Chronic Care Management    Clinical Social Work Follow Up Note  06/10/2020 Name: Cynthia Espinoza MRN: [id number] DOB: 09-Aug-1956  Cynthia Espinoza is a 64 y.o. year old female who is a primary care patient of [name], FNP. The CCM team was consulted for assistance with [name] .   Review of patient status, including review of consultants reports, other relevant assessments, and collaboration with appropriate care team members and the patient's provider was performed as part of comprehensive patient evaluation and provision of chronic care management services.    SDOH (Social Determinants of Health) assessments performed: No;risk for tobacco use; risk of depression; risk of stress; risk of financial strain      Office Visit from 02/04/2020 in [place]  PHQ-9 Total Score 5      GAD 7 : Generalized Anxiety Score 02/04/2020 05/15/2019  Nervous, Anxious, on Edge 1 2  Control/stop worrying 2 1  Worry too much - different things 2 1  Trouble relaxing 1 1  Restless 1 0  Easily annoyed or irritable 1 0  Afraid - awful might happen 1 1  Total GAD 7 Score 9 6  Anxiety Difficulty Not difficult at all Somewhat difficult   Outpatient Encounter Medications as of 06/10/2020  Medication Sig   ALPRAZolam (XANAX) 0.5 MG tablet Take 1 tablet (0.5 mg total) by mouth at bedtime as needed for anxiety.   amLODipine (NORVASC) 10 MG tablet Take 1 tablet (10 mg total) by mouth daily.   benazepril (LOTENSIN) 40 MG tablet Take 1 tablet (40 mg total) by mouth daily.   blood glucose meter kit and supplies Dispense based on patient and insurance preference. Use up to four times daily as directed. (FOR ICD-10 E10.9, E11.9).   Dulaglutide (TRULICITY) 2.83 MO/2.9UT SOPN Inject 0.75 mg into the skin once a week.   hydrochlorothiazide (HYDRODIURIL) 25 MG tablet Take 1 tablet (25 mg total) by mouth daily.   insulin aspart protamine - aspart (NOVOLOG MIX 70/30 FLEXPEN) (70-30) 100  UNIT/ML FlexPen Inject 0.15-0.3 mLs (15-30 Units total) into the skin 2 (two) times daily.   Insulin Pen Needle 29G X 5MM MISC 1 Units by Does not apply route 2 (two) times a day. Insulin needles for relion insulin pen.   levothyroxine (SYNTHROID) 200 MCG tablet Take 1 tablet (200 mcg total) by mouth daily before breakfast. (repeat labs in Nov)   meloxicam (MOBIC) 7.5 MG tablet Take 1 tablet (7.5 mg total) by mouth daily.   No facility-administered encounter medications on file as of 06/10/2020.    Goals       Client wants to talk with LCSW about depression/stress issues facing client (pt-stated)      Current Barriers:  Pain issues from fibromyalgia in client with Chronic Diagnoses of Type 2 DM, Hx PTSD, Major Depressive Disorder, HTN and Hypothyroidism Finances Challenges Has been working on [place] but it is not approved yet. This is challenge area  Clinical Social Work Clinical Goal(s):  LCSW and client will talk in next 30 days about depression and stress issues faced by client  Interventions: Talked with Jeanella Craze, son of client about about relaxation techniques of choice (watch TV, takes walks with use of walker, likes to talk with family or friends via phone) .  Talked with Wynonia Lawman about mood of client Talked with Jeanella Craze son of client about health needs of mother of client Talked with Wynonia Lawman about appetite of client Talked with Wynonia Lawman about sleeping  issues of client Talked with Wynonia Lawman about ambulation needs of client (client uses a walker to ambulate) Talked with Wynonia Lawman about pain issues of client Talked with Wynonia Lawman about transport needs of client Talked with Wynonia Lawman about client completion of ADLs  Patient Self Care Activities:  Self administers medications as prescribed Attends all scheduled provider appointments   Plan:  Client to attend scheduled medical appointments Client to use relaxation techniques of choice to help her manage depression symptoms LCSW to  call client in next 4 weeks to talk with clinet about mangement of depression symptoms .  Initial goal documentation     Follow Up Plan: LCSW to call client/son of client in next 4 weeks to talk with client/son about client management of depression symptoms   Norva Riffle.Noah Pelaez MSW, LCSW Licensed Clinical Social Worker [facility] Care [facility] [phone]  I have reviewed the CCM documentation and agree with the written assessment and plan of care.  Evelina Dun, FNP

## 2020-06-10 NOTE — Patient Instructions (Addendum)
Licensed Clinical Social Worker Visit Information  Goals we discussed today:      Client wants to talk with LCSW about depression/stress issues facing client (pt-stated)        Current Barriers:   Pain issues from fibromyalgia in client with Chronic Diagnoses of Type 2 DM, Hx PTSD, Major Depressive Disorder, HTN and Hypothyroidism  Finances Challenges  Has been working on [employer] but it is not approved yet. This is challenge area  Clinical Social Work Clinical Goal(s):   LCSW and client will talk in next 30 days about depression and stress issues faced by client  Interventions:  Talked with Oleh Genin, son of client about about relaxation techniques of choice (watch TV, takes walks with use of walker, likes to talk with family or friends via phone)  .  Talked with Genevie Cheshire about mood of client  Talked with Oleh Genin son of client about health needs of mother of client  Talked with Genevie Cheshire about appetite of client  Talked with Genevie Cheshire about sleeping issues of client  Talked with Genevie Cheshire about ambulation needs of client (client uses a walker to ambulate)  Talked with Genevie Cheshire about pain issues of client  Talked with Genevie Cheshire about transport needs of client  Talked with Genevie Cheshire about client completion of ADLs  Patient Self Care Activities:   Self administers medications as prescribed  Attends all scheduled provider appointments   Plan:  Client to attend scheduled medical appointments Client to use relaxation techniques of choice to help her manage depression symptoms LCSW to call client in next 4 weeks to talk with clinet about mangement of depression symptoms .  Initial goal documentation     Follow Up Plan: LCSW to call client/son of client in next 4 weeks to talk with client/son about client management of depression symptoms  Materials Provided: No  The patient/Cynthia Espinoza, son of client, verbalized understanding of instructions provided today and  declined a print copy of patient instruction materials.   Cynthia Espinoza.Cynthia Espinoza MSW, LCSW Licensed Clinical Social Worker [facility] Care [facility] [phone]

## 2020-07-16 ENCOUNTER — Ambulatory Visit: Payer: Medicare Other | Admitting: Licensed Clinical Social Worker

## 2020-07-16 DIAGNOSIS — F331 Major depressive disorder, recurrent, moderate: Secondary | ICD-10-CM

## 2020-07-16 DIAGNOSIS — I10 Essential (primary) hypertension: Secondary | ICD-10-CM

## 2020-07-16 DIAGNOSIS — E1169 Type 2 diabetes mellitus with other specified complication: Secondary | ICD-10-CM

## 2020-07-16 DIAGNOSIS — Z8659 Personal history of other mental and behavioral disorders: Secondary | ICD-10-CM

## 2020-07-16 DIAGNOSIS — E039 Hypothyroidism, unspecified: Secondary | ICD-10-CM

## 2020-07-16 DIAGNOSIS — Z794 Long term (current) use of insulin: Secondary | ICD-10-CM

## 2020-07-16 NOTE — Chronic Care Management (AMB) (Addendum)
Chronic Care Management    Clinical Social Work Follow Up Note  07/16/2020 Name: Cynthia Espinoza MRN: [id number] DOB: May 15, 1956  Cynthia Espinoza is a 64 y.o. year old female who is a primary care patient of [name], FNP. The CCM team was consulted for assistance with [name] .   Review of patient status, including review of consultants reports, other relevant assessments, and collaboration with appropriate care team members and the patient's provider was performed as part of comprehensive patient evaluation and provision of chronic care management services.    SDOH (Social Determinants of Health) assessments performed: No;risk for tobacco use; risk for depression; risk for stress; risk for financial strain    Office Visit from 02/04/2020 in [place]  PHQ-9 Total Score 5         GAD 7 : Generalized Anxiety Score 02/04/2020 05/15/2019  Nervous, Anxious, on Edge 1 2  Control/stop worrying 2 1  Worry too much - different things 2 1  Trouble relaxing 1 1  Restless 1 0  Easily annoyed or irritable 1 0  Afraid - awful might happen 1 1  Total GAD 7 Score 9 6  Anxiety Difficulty Not difficult at all Somewhat difficult    Outpatient Encounter Medications as of 07/16/2020  Medication Sig   ALPRAZolam (XANAX) 0.5 MG tablet Take 1 tablet (0.5 mg total) by mouth at bedtime as needed for anxiety.   amLODipine (NORVASC) 10 MG tablet Take 1 tablet (10 mg total) by mouth daily.   benazepril (LOTENSIN) 40 MG tablet Take 1 tablet (40 mg total) by mouth daily.   blood glucose meter kit and supplies Dispense based on patient and insurance preference. Use up to four times daily as directed. (FOR ICD-10 E10.9, E11.9).   Dulaglutide (TRULICITY) 3.29 JM/4.2AS SOPN Inject 0.75 mg into the skin once a week.   hydrochlorothiazide (HYDRODIURIL) 25 MG tablet Take 1 tablet (25 mg total) by mouth daily.   insulin aspart protamine - aspart (NOVOLOG MIX 70/30 FLEXPEN)  (70-30) 100 UNIT/ML FlexPen Inject 0.15-0.3 mLs (15-30 Units total) into the skin 2 (two) times daily.   Insulin Pen Needle 29G X 5MM MISC 1 Units by Does not apply route 2 (two) times a day. Insulin needles for relion insulin pen.   levothyroxine (SYNTHROID) 200 MCG tablet Take 1 tablet (200 mcg total) by mouth daily before breakfast. (repeat labs in Nov)   meloxicam (MOBIC) 7.5 MG tablet Take 1 tablet (7.5 mg total) by mouth daily.   No facility-administered encounter medications on file as of 07/16/2020.   Goals       Client wants to talk with LCSW about depression/stress issues facing client (pt-stated)      Current Barriers:  Pain issues from fibromyalgia in client with Chronic Diagnoses of Type 2 DM, Hx PTSD, Major Depressive Disorder, HTN and Hypothyroidism Finances Challenges Has been working on [place] but it is not approved yet. This is challenge area  Clinical Social Work Clinical Goal(s):  LCSW and client will talk in next 30 days about depression and stress issues faced by client  Interventions: Talked with Jeanella Craze, son of client about about relaxation techniques of choice (watch TV, takes walks with use of walker, likes to talk with family or friends via phone) .  Talked with Wynonia Lawman about mood of client Talked with Wynonia Lawman about appetite of client Talked with Wynonia Lawman about sleeping issues of client Talked with Wynonia Lawman about ambulation needs of client (client uses  a walker to ambulate) Talked with Wynonia Lawman about pain issues of client Talked with Wynonia Lawman about transport needs of client Talked with Wynonia Lawman about client completion of ADLs  Talked with Wynonia Lawman about upcoming client medical appointments Talked with Wynonia Lawman about vision issues of client Talked with Wynonia Lawman about financial status of client (Client has said previously that she feels more stable financially since [place] benefit  was approved for client) Talked with Wynonia Lawman about energy level of  client  Patient Self Care Activities:  Self administers medications as prescribed Attends all scheduled provider appointments   Plan:  Client to attend scheduled medical appointments Client to use relaxation techniques of choice to help her manage depression symptoms LCSW to call client in next 4 weeks to talk with clinet about mangement of depression symptoms .  Initial goal documentation        Follow Up Plan: LCSW to call client/son of client in next 4 weeks to talk with client/son about client management of depression symptoms   Norva Riffle.Jayden Kratochvil MSW, LCSW Licensed Clinical Social Worker [facility] [phone]  I have reviewed the CCM documentation and agree with the written assessment and plan of care.  Evelina Dun, FNP

## 2020-07-16 NOTE — Patient Instructions (Addendum)
Licensed Clinical Social Worker Visit Information  Goals we discussed today:   Client wants to talk with LCSW about depression/stress issues facing client (pt-stated)          Current Barriers:   Pain issues from fibromyalgia in client with Chronic Diagnoses of Type 2 DM, Hx PTSD, Major Depressive Disorder, HTN and Hypothyroidism  Finances Challenges  Has been working on [employer] but it is not approved yet. This is challenge area  Clinical Social Work Clinical Goal(s):   LCSW and client will talk in next 30 days about depression and stress issues faced by client  Interventions:  Talked withClyde Logan Bores, son of client aboutabout relaxation techniques of choice (watch TV, takes walks with use of walker, likes to talk with family or friends via phone)  .Talked with Genevie Cheshire about mood of client  Talked with Genevie Cheshire about appetite of client  Talked with Genevie Cheshire about sleeping issues of client  Talked with Genevie Cheshire about ambulation needs of client (client uses a walker to ambulate)  Talked with Genevie Cheshire about pain issues of client  Talked with Genevie Cheshire about transport needs of client  Talked with Genevie Cheshire about client completion of ADLs   Talked with Genevie Cheshire about upcoming client medical appointments  Talked with Genevie Cheshire about vision issues of client  Talked with Genevie Cheshire about financial status of client (Client has said previously that she feels more stable financially since Social Security Disability benefit  was approved for client)  Talked with Genevie Cheshire about energy level of client  Patient Self Care Activities:   Self administers medications as prescribed  Attends all scheduled provider appointments   Plan:  Client to attend scheduled medical appointments Client to use relaxation techniques of choice to help her manage depression symptoms LCSW to call client in next 4 weeks to talk with clinet about mangement of depression symptoms .  Initial goal documentation        Follow Up Plan: LCSW to call client/son of clientin next 4 weeks to talk with client/sonabout client management of depression symptoms  Materials Provided: No  The patient/Clyde Akter, son of patient, verbalized understanding of instructions provided today and declined a print copy of patient instruction materials.   Kelton Pillar.Papa Piercefield MSW, LCSW Licensed Clinical Social Worker [facility] Care [facility] [phone]

## 2020-07-27 ENCOUNTER — Other Ambulatory Visit: Payer: Self-pay | Admitting: Family

## 2020-07-27 DIAGNOSIS — E039 Hypothyroidism, unspecified: Secondary | ICD-10-CM

## 2020-08-20 ENCOUNTER — Telehealth: Payer: Medicare Other

## 2020-09-23 ENCOUNTER — Ambulatory Visit: Payer: Medicare Other | Admitting: Licensed Clinical Social Worker

## 2020-09-23 DIAGNOSIS — E039 Hypothyroidism, unspecified: Secondary | ICD-10-CM

## 2020-09-23 DIAGNOSIS — E1169 Type 2 diabetes mellitus with other specified complication: Secondary | ICD-10-CM

## 2020-09-23 DIAGNOSIS — Z8659 Personal history of other mental and behavioral disorders: Secondary | ICD-10-CM

## 2020-09-23 DIAGNOSIS — Z794 Long term (current) use of insulin: Secondary | ICD-10-CM

## 2020-09-23 DIAGNOSIS — F3341 Major depressive disorder, recurrent, in partial remission: Secondary | ICD-10-CM

## 2020-09-23 DIAGNOSIS — I10 Essential (primary) hypertension: Secondary | ICD-10-CM

## 2020-09-23 NOTE — Patient Instructions (Addendum)
Licensed Clinical Social Worker Visit Information  Goals we discussed today:    Client wants to talk with LCSW about depression/stress issues facing client (pt-stated)          Current Barriers:   Pain issues from fibromyalgia in client with Chronic Diagnoses of Type 2 DM, Hx PTSD, Major Depressive Disorder, HTN and Hypothyroidism  Finances Challenges  Has been working on [employer] but it is not approved yet. This is challenge area  Clinical Social Work Clinical Goal(s):   LCSW and client will talk in next 30 days about depression and stress issues faced by client  Interventions:   Talked with Cynthia Espinoza, son of client, about client needs  Talked with Cynthia Espinoza about ambulation of client (uses a walker to help her walk)  Talked with Cynthia Espinoza about upcoming client appointments  Talked with Cynthia Espinoza about transport needs of client  Talked with Cynthia Espinoza about mood of client  Patient Self Care Activities:   Self administers medications as prescribed  Attends all scheduled provider appointments   Plan:  Client to attend scheduled medical appointments Client to use relaxation techniques of choice to help her manage depression symptoms LCSW to call client in next 4 weeks to talk with clinet about mangement of depression symptoms .  Initial goal documentation       Follow Up Plan:LCSW to call client/son of clientin next 4 weeks to talk with client/sonabout client management of depression symptoms  Materials Provided: No  The patient/Cynthia Espinoza, son of client, verbalized understanding of instructions provided today and declined a print copy of patient instruction materials.   Cynthia Espinoza.Cynthia Espinoza MSW, LCSW Licensed Clinical Social Worker [facility] [phone]

## 2020-09-23 NOTE — Chronic Care Management (AMB) (Addendum)
Chronic Care Management    Clinical Social Work Follow Up Note  09/23/2020 Name: Cynthia Espinoza MRN: [id number] DOB: May 26, 1956  Cynthia Espinoza is a 64 y.o. year old female who is a primary care patient of [name], FNP. The CCM team was consulted for assistance with [name] .   Review of patient status, including review of consultants reports, other relevant assessments, and collaboration with appropriate care team members and the patient's provider was performed as part of comprehensive patient evaluation and provision of chronic care management services.    SDOH (Social Determinants of Health) assessments performed: No; risk for depression; risk for stress; risk for tobacco use; risk for financial strain; risk for physical inactivity    Office Visit from 02/04/2020 in [place]  PHQ-9 Total Score 5      GAD 7 : Generalized Anxiety Score 02/04/2020 05/15/2019  Nervous, Anxious, on Edge 1 2  Control/stop worrying 2 1  Worry too much - different things 2 1  Trouble relaxing 1 1  Restless 1 0  Easily annoyed or irritable 1 0  Afraid - awful might happen 1 1  Total GAD 7 Score 9 6  Anxiety Difficulty Not difficult at all Somewhat difficult     Outpatient Encounter Medications as of 09/23/2020  Medication Sig   ALPRAZolam (XANAX) 0.5 MG tablet Take 1 tablet (0.5 mg total) by mouth at bedtime as needed for anxiety.   amLODipine (NORVASC) 10 MG tablet Take 1 tablet (10 mg total) by mouth daily.   benazepril (LOTENSIN) 40 MG tablet Take 1 tablet (40 mg total) by mouth daily.   blood glucose meter kit and supplies Dispense based on patient and insurance preference. Use up to four times daily as directed. (FOR ICD-10 E10.9, E11.9).   Dulaglutide (TRULICITY) 5.37 HK/3.2XM SOPN Inject 0.75 mg into the skin once a week.   hydrochlorothiazide (HYDRODIURIL) 25 MG tablet Take 1 tablet (25 mg total) by mouth daily.   insulin aspart protamine - aspart  (NOVOLOG MIX 70/30 FLEXPEN) (70-30) 100 UNIT/ML FlexPen Inject 0.15-0.3 mLs (15-30 Units total) into the skin 2 (two) times daily.   Insulin Pen Needle 29G X 5MM MISC 1 Units by Does not apply route 2 (two) times a day. Insulin needles for relion insulin pen.   levothyroxine (SYNTHROID) 200 MCG tablet TAKE 1 TABLET BY MOUTH DAILY BEFORE BREAKFAST   meloxicam (MOBIC) 7.5 MG tablet Take 1 tablet (7.5 mg total) by mouth daily.   No facility-administered encounter medications on file as of 09/23/2020.    Goals       Client wants to talk with LCSW about depression/stress issues facing client (pt-stated)      Current Barriers:  Pain issues from fibromyalgia in client with Chronic Diagnoses of Type 2 DM, Hx PTSD, Major Depressive Disorder, HTN and Hypothyroidism Finances Challenges Has been working on [employer] but it is not approved yet. This is challenge area  Clinical Social Work Clinical Goal(s):  LCSW and client will talk in next 30 days about depression and stress issues faced by client  Interventions:  Talked with Jeanella Craze, son of client, about client needs Talked with Wynonia Lawman about ambulation of client (uses a walker to help her walk) Talked with Wynonia Lawman about upcoming client appointments Talked with Wynonia Lawman about transport needs of client Talked with Wynonia Lawman about mood of client  Patient Self Care Activities:  Self administers medications as prescribed Attends all scheduled provider appointments   Plan:  Client to attend scheduled medical appointments Client to use relaxation techniques of choice to help her manage depression symptoms LCSW to call client in next 4 weeks to talk with clinet about mangement of depression symptoms .  Initial goal documentation        Follow Up Plan: LCSW to call client/son of client in next 4 weeks to talk with client/son about client management of depression symptoms  Norva Riffle.Claudetta Sallie MSW, LCSW Licensed Clinical Social  Worker [facility] Care Management [phone]  I have reviewed and agree with the above documentation.    Evelina Dun, FNP

## 2020-10-12 ENCOUNTER — Other Ambulatory Visit: Payer: Self-pay | Admitting: Family

## 2020-10-12 DIAGNOSIS — E039 Hypothyroidism, unspecified: Secondary | ICD-10-CM

## 2020-10-13 ENCOUNTER — Other Ambulatory Visit: Payer: Self-pay | Admitting: *Deleted

## 2020-10-13 DIAGNOSIS — F3341 Major depressive disorder, recurrent, in partial remission: Secondary | ICD-10-CM

## 2020-10-13 DIAGNOSIS — Z79899 Other long term (current) drug therapy: Secondary | ICD-10-CM

## 2020-10-13 DIAGNOSIS — F1320 Sedative, hypnotic or anxiolytic dependence, uncomplicated: Secondary | ICD-10-CM

## 2020-10-28 ENCOUNTER — Ambulatory Visit: Payer: Medicare Other | Admitting: Licensed Clinical Social Worker

## 2020-10-28 DIAGNOSIS — I10 Essential (primary) hypertension: Secondary | ICD-10-CM

## 2020-10-28 DIAGNOSIS — F3341 Major depressive disorder, recurrent, in partial remission: Secondary | ICD-10-CM

## 2020-10-28 DIAGNOSIS — E039 Hypothyroidism, unspecified: Secondary | ICD-10-CM

## 2020-10-28 DIAGNOSIS — Z8659 Personal history of other mental and behavioral disorders: Secondary | ICD-10-CM

## 2020-10-28 DIAGNOSIS — E1169 Type 2 diabetes mellitus with other specified complication: Secondary | ICD-10-CM

## 2020-10-28 DIAGNOSIS — Z794 Long term (current) use of insulin: Secondary | ICD-10-CM

## 2020-10-28 NOTE — Patient Instructions (Addendum)
Licensed Clinical Social Worker Visit Information  Goals we discussed today:   Client wants to talk with LCSW about depression/stress issues facing client (pt-stated)           Current Barriers:   Pain issues from fibromyalgia in client with Chronic Diagnoses of Type 2 DM, Hx PTSD, Major Depressive Disorder, HTN and Hypothyroidism  Finances Challenges  Has been working on [employer] but it is not approved yet. This is challenge area  Clinical Social Work Clinical Goal(s):   LCSW and client will talk in next 30 days about depression and stress issues faced by client  Interventions:  Talked with client about ambulation of client (uses a walker to help her walk)  Talked with client about upcoming client appointments  Talked with client about transport needs of client  Talked with client about mood of client   Talked with client about appetite of client  Talked with client about relaxation techniques of client (client likes to walk to relax, client likes to watch TV, likes to read)  Talked with client about pain issues of client  Talked with client about health needs of her mother  Talked with client about vision of client  Provided counseling support for client  Talked with client about medication procurement for client  Talked with client about energy level of client  Talked with client about sleeping schedule for client  Encouraged client to call RNCM as needed for nursing support  Patient Self Care Activities:   Self administers medications as prescribed  Attends all scheduled provider appointments   Plan:  Client to attend scheduled medical appointments Client to use relaxation techniques of choice to help her manage depression symptoms LCSW to call client in next 3 weeks to talk with clinet about mangement of depression symptoms .  Initial goal documentation       Follow Up Plan:LCSW to call client/son of clientin next 4 weeks  to talk with client/sonabout client management of depression symptoms  Materials Provided: No  The patient verbalized understanding of instructions provided today and declined a print copy of patient instruction materials.   Kelton Pillar.Daine Croker MSW, LCSW Licensed Clinical Social Worker [facility] [phone]

## 2020-10-28 NOTE — Chronic Care Management (AMB) (Signed)
Chronic Care Management    Clinical Social Work Follow Up Note  10/28/2020 Name: Cynthia Espinoza MRN: [id number] DOB: 1956-04-20  Cynthia Espinoza is a 64 y.o. year old female who is a primary care patient of [name], FNP. The CCM team was consulted for assistance with [name] .   Review of patient status, including review of consultants reports, other relevant assessments, and collaboration with appropriate care team members and the patient's provider was performed as part of comprehensive patient evaluation and provision of chronic care management services.    SDOH (Social Determinants of Health) assessments performed: No; risk for depression; risk for tobacco use; risk for stress; risk for physical inactivity  [place] Office Visit from 02/04/2020 in [place]  PHQ-9 Total Score 5       GAD 7 : Generalized Anxiety Score 02/04/2020 05/15/2019  Nervous, Anxious, on Edge 1 2  Control/stop worrying 2 1  Worry too much - different things 2 1  Trouble relaxing 1 1  Restless 1 0  Easily annoyed or irritable 1 0  Afraid - awful might happen 1 1  Total GAD 7 Score 9 6  Anxiety Difficulty Not difficult at all Somewhat difficult    Outpatient Encounter Medications as of 10/28/2020  Medication Sig  . ALPRAZolam (XANAX) 0.5 MG tablet Take 1 tablet (0.5 mg total) by mouth at bedtime as needed for anxiety.  [name] Kitchen amLODipine (NORVASC) 10 MG tablet Take 1 tablet (10 mg total) by mouth daily.  . benazepril (LOTENSIN) 40 MG tablet Take 1 tablet (40 mg total) by mouth daily.  . blood glucose meter kit and supplies Dispense based on patient and insurance preference. Use up to four times daily as directed. (FOR ICD-10 E10.9, E11.9).  . Dulaglutide (TRULICITY) 1.15 BW/6.2MB SOPN Inject 0.75 mg into the skin once a week.  . hydrochlorothiazide (HYDRODIURIL) 25 MG tablet Take 1 tablet (25 mg total) by mouth daily.  . insulin aspart protamine - aspart (NOVOLOG  MIX 70/30 FLEXPEN) (70-30) 100 UNIT/ML FlexPen Inject 0.15-0.3 mLs (15-30 Units total) into the skin 2 (two) times daily.  . Insulin Pen Needle 29G X 5MM MISC 1 Units by Does not apply route 2 (two) times a day. Insulin needles for relion insulin pen.  . levothyroxine (SYNTHROID) 200 MCG tablet Take 1 tablet (200 mcg total) by mouth daily before breakfast. (Needs to be seen before next refill)  . meloxicam (MOBIC) 7.5 MG tablet Take 1 tablet (7.5 mg total) by mouth daily.   No facility-administered encounter medications on file as of 10/28/2020.    Goals    .  Client wants to talk with LCSW about depression/stress issues facing client (pt-stated)      Current Barriers:  . Pain issues from fibromyalgia in client with Chronic Diagnoses of Type 2 DM, Hx PTSD, Major Depressive Disorder, HTN and Hypothyroidism . Finances Challenges . Has been working on [employer] but it is not approved yet. This is challenge area  Clinical Social Work Clinical Goal(s):  [name] Kitchen LCSW and client will talk in next 30 days about depression and stress issues faced by client  Interventions:  Talked with client about ambulation of client (uses a walker to help her walk)  Talked with client about upcoming client appointments  Talked with client about transport needs of client . Talked with client about mood of client  . Talked with client about appetite of client . Talked with client about relaxation techniques of client (  client likes to walk to relax, client likes to watch TV, likes to read) . Talked with client about pain issues of client . Talked with client about health needs of her mother . Talked with client about vision of client . Provided counseling support for client . Talked with client about medication procurement for client . Talked with client about energy level of client . Talked with client about sleeping schedule for client . Encouraged client to call RNCM as needed for nursing  support  Patient Self Care Activities:  . Self administers medications as prescribed . Attends all scheduled provider appointments   Plan:  Client to attend scheduled medical appointments Client to use relaxation techniques of choice to help her manage depression symptoms LCSW to call client in next 3 weeks to talk with clinet about mangement of depression symptoms .  Initial goal documentation       Follow Up Plan:LCSW to call client/son of clientin next 4 weeks to talk with client/sonabout client management of depression symptoms  Norva Riffle.Caydin Yeatts MSW, LCSW Licensed Clinical Social Worker [facility] Care [facility] [phone]

## 2020-12-02 ENCOUNTER — Ambulatory Visit: Payer: Medicare Other | Admitting: Licensed Clinical Social Worker

## 2020-12-02 DIAGNOSIS — F3341 Major depressive disorder, recurrent, in partial remission: Secondary | ICD-10-CM

## 2020-12-02 DIAGNOSIS — Z8659 Personal history of other mental and behavioral disorders: Secondary | ICD-10-CM

## 2020-12-02 DIAGNOSIS — E1169 Type 2 diabetes mellitus with other specified complication: Secondary | ICD-10-CM

## 2020-12-02 DIAGNOSIS — E039 Hypothyroidism, unspecified: Secondary | ICD-10-CM

## 2020-12-02 DIAGNOSIS — I10 Essential (primary) hypertension: Secondary | ICD-10-CM

## 2020-12-02 NOTE — Patient Instructions (Addendum)
Licensed Clinical Social Worker Visit Information  Goals we discussed today:  .  Client wants to talk with LCSW about depression/stress issues facing client (pt-stated)         Current Barriers:   Pain issues from fibromyalgia in client with Chronic Diagnoses of Type 2 DM, Hx PTSD, Major Depressive Disorder, HTN and Hypothyroidism  Finances Challenges  Has been working on [employer] but it is not approved yet. This is challenge area  Clinical Social Work Clinical Goal(s):   LCSW and client will talk in next 30 days about depression and stress issues faced by client  Interventions:  Talked with Chariti about her recent positive test for COVID 19. (Client said she is self quarantined)  Talked with Idalia about sleeping issues of client  Talked withclientabout ambulation of client (uses a walker to help her walk)  Talked withclientabout mood of client  Talked with client about appetite of client  Talked with client about relaxation techniques of client (client likes to walk to relax, client likes to watch TV, likes to read)  Talked with client about pain issues of client  Talked with client about vision of client  Provided counseling support for client  Talked with client about medication procurement for client  Talked with client about energy level of client (she said she has decreased energy level)  Encouraged client to call RNCM as needed for nursing support  Talked with client about her phone calls with her son   Patient Self Care Activities:   Self administers medications as prescribed  Attends all scheduled provider appointments   Plan:  Client to attend scheduled medical appointments Client to use relaxation techniques of choice to help her manage depression symptoms LCSW to call client in next 4 weeks to talk with clinet about management of depression symptoms .  Initial goal documentation       Follow Up Plan:LCSW to call  client/son of clientin next 4 weeks to talk with client/sonabout client management of depression symptoms  Materials Provided: No  The patient verbalized understanding of instructions provided today and declined a print copy of patient instruction materials.   Kelton Pillar.Kenisha Lynds MSW, LCSW Licensed Clinical Social Worker [facility] Care [facility] [phone]

## 2020-12-02 NOTE — Chronic Care Management (AMB) (Signed)
Chronic Care Management    Clinical Social Work Follow Up Note  12/02/2020 Name: Cynthia Espinoza MRN: [id number] DOB: 23-Nov-1955  Cynthia Espinoza is a 65 y.o. year old female who is a primary care patient of Cynthia Balloon, FNP. The CCM team was consulted for assistance with Cynthia Espinoza .   Review of patient status, including review of consultants reports, other relevant assessments, and collaboration with appropriate care team members and the patient's provider was performed as part of comprehensive patient evaluation and provision of chronic care management services.    SDOH (Social Determinants of Health) assessments performed: No; risk for depression; risk for tobacco use; risk for stress; risk for physical inactivity  [place] Office Visit from 02/04/2020 in [place]  PHQ-9 Total Score 5      GAD 7 : Generalized Anxiety Score 02/04/2020 05/15/2019  Nervous, Anxious, on Edge 1 2  Control/stop worrying 2 1  Worry too much - different things 2 1  Trouble relaxing 1 1  Restless 1 0  Easily annoyed or irritable 1 0  Afraid - awful might happen 1 1  Total GAD 7 Score 9 6  Anxiety Difficulty Not difficult at all Somewhat difficult    Outpatient Encounter Medications as of 12/02/2020  Medication Sig  . ALPRAZolam (XANAX) 0.5 MG tablet Take 1 tablet (0.5 mg total) by mouth at bedtime as needed for anxiety.  [name] Kitchen amLODipine (NORVASC) 10 MG tablet Take 1 tablet (10 mg total) by mouth daily.  . benazepril (LOTENSIN) 40 MG tablet Take 1 tablet (40 mg total) by mouth daily.  . blood glucose meter kit and supplies Dispense based on patient and insurance preference. Use up to four times daily as directed. (FOR ICD-10 E10.9, E11.9).  . Dulaglutide (TRULICITY) 8.41 YS/0.6TK SOPN Inject 0.75 mg into the skin once a week.  . hydrochlorothiazide (HYDRODIURIL) 25 MG tablet Take 1 tablet (25 mg total) by mouth daily.  . insulin aspart protamine - aspart (NOVOLOG MIX  70/30 FLEXPEN) (70-30) 100 UNIT/ML FlexPen Inject 0.15-0.3 mLs (15-30 Units total) into the skin 2 (two) times daily.  . Insulin Pen Needle 29G X 5MM MISC 1 Units by Does not apply route 2 (two) times a day. Insulin needles for relion insulin pen.  . levothyroxine (SYNTHROID) 200 MCG tablet Take 1 tablet (200 mcg total) by mouth daily before breakfast. (Needs to be seen before next refill)  . meloxicam (MOBIC) 7.5 MG tablet Take 1 tablet (7.5 mg total) by mouth daily.   No facility-administered encounter medications on file as of 12/02/2020.    Goals    .  Client wants to talk with LCSW about depression/stress issues facing client (pt-stated)      Current Barriers:  . Pain issues from fibromyalgia in client with Chronic Diagnoses of Type 2 DM, Hx PTSD, Major Depressive Disorder, HTN and Hypothyroidism . Finances Challenges . Has been working on [employer] but it is not approved yet. This is challenge area  Clinical Social Work Clinical Goal(s):  [name] Kitchen LCSW and client will talk in next 30 days about depression and stress issues faced by client  Interventions:  Talked with Cynthia Espinoza about her recent positive test for COVID 19. (Client said she is self quarantined)  Talked with Cynthia Espinoza about sleeping issues of client  Talked with client about ambulation of client (uses a walker to help her walk)  Talked with client about mood of client   Talked with client about appetite of client  Talked with client about relaxation techniques of client (client likes to walk to relax, client likes to watch TV, likes to read)  Talked with client about pain issues of client  Talked with client about vision of client  Provided counseling support for client  Talked with client about medication procurement for client  Talked with client about energy level of client (she said she has decreased energy level)  Encouraged client to call RNCM as needed for nursing support  Talked with client about her  phone calls with her son   Patient Self Care Activities:  . Self administers medications as prescribed . Attends all scheduled provider appointments   Plan:  Client to attend scheduled medical appointments Client to use relaxation techniques of choice to help her manage depression symptoms LCSW to call client in next 4 weeks to talk with clinet about management of depression symptoms .  Initial goal documentation       Follow Up Plan:LCSW to call client/son of clientin next 4 weeks to talk with client/sonabout client management of depression symptoms  Cynthia Espinoza MSW, LCSW Licensed Clinical Social Worker [facility] Care Management [phone]

## 2021-01-05 ENCOUNTER — Other Ambulatory Visit: Payer: Self-pay | Admitting: Family

## 2021-01-05 ENCOUNTER — Ambulatory Visit: Payer: Medicare Other | Admitting: Licensed Clinical Social Worker

## 2021-01-05 DIAGNOSIS — E039 Hypothyroidism, unspecified: Secondary | ICD-10-CM

## 2021-01-05 DIAGNOSIS — Z794 Long term (current) use of insulin: Secondary | ICD-10-CM

## 2021-01-05 DIAGNOSIS — M797 Fibromyalgia: Secondary | ICD-10-CM

## 2021-01-05 DIAGNOSIS — I10 Essential (primary) hypertension: Secondary | ICD-10-CM

## 2021-01-05 DIAGNOSIS — F3341 Major depressive disorder, recurrent, in partial remission: Secondary | ICD-10-CM

## 2021-01-05 DIAGNOSIS — E1169 Type 2 diabetes mellitus with other specified complication: Secondary | ICD-10-CM

## 2021-01-05 DIAGNOSIS — Z8659 Personal history of other mental and behavioral disorders: Secondary | ICD-10-CM

## 2021-01-05 NOTE — Chronic Care Management (AMB) (Signed)
  Chronic Care Management    Clinical Social Work Follow Up Note  01/05/2021 Name: Cynthia Espinoza MRN: [id number] DOB: [birth year]  Cynthia Espinoza is a 65 y.o. year old female who is a primary care patient of [name], FNP. The CCM team was consulted for assistance with [name] .   Review of patient status, including review of consultants reports, other relevant assessments, and collaboration with appropriate care team members and the patient's provider was performed as part of comprehensive patient evaluation and provision of chronic care management services.    SDOH (Social Determinants of Health) assessments performed: No; risk for depression; risk for tobacco use; risk for stress; risk for physical inactivity  [place] Office Visit from 02/04/2020 in [place]  PHQ-9 Total Score 5      GAD 7 : Generalized Anxiety Score 02/04/2020 05/15/2019  Nervous, Anxious, on Edge 1 2  Control/stop worrying 2 1  Worry too much - different things 2 1  Trouble relaxing 1 1  Restless 1 0  Easily annoyed or irritable 1 0  Afraid - awful might happen 1 1  Total GAD 7 Score 9 6  Anxiety Difficulty Not difficult at all Somewhat difficult    Outpatient Encounter Medications as of 01/05/2021  Medication Sig  . ALPRAZolam (XANAX) 0.5 MG tablet Take 1 tablet (0.5 mg total) by mouth at bedtime as needed for anxiety.  [name] Kitchen amLODipine (NORVASC) 10 MG tablet Take 1 tablet (10 mg total) by mouth daily.  . benazepril (LOTENSIN) 40 MG tablet Take 1 tablet (40 mg total) by mouth daily.  . blood glucose meter kit and supplies Dispense based on patient and insurance preference. Use up to four times daily as directed. (FOR ICD-10 E10.9, E11.9).  . Dulaglutide (TRULICITY) 4.19 QQ/2.2LN SOPN Inject 0.75 mg into the skin once a week.  . hydrochlorothiazide (HYDRODIURIL) 25 MG tablet Take 1 tablet (25 mg total) by mouth daily.  . insulin aspart protamine - aspart (NOVOLOG MIX  70/30 FLEXPEN) (70-30) 100 UNIT/ML FlexPen Inject 0.15-0.3 mLs (15-30 Units total) into the skin 2 (two) times daily.  . Insulin Pen Needle 29G X 5MM MISC 1 Units by Does not apply route 2 (two) times a day. Insulin needles for relion insulin pen.  . levothyroxine (SYNTHROID) 200 MCG tablet Take 1 tablet (200 mcg total) by mouth daily before breakfast. (Needs to be seen before next refill)  . meloxicam (MOBIC) 7.5 MG tablet Take 1 tablet (7.5 mg total) by mouth daily.   No facility-administered encounter medications on file as of 01/05/2021.   LCSW called client home phone number several times today but LCSW was not able to speak via phone today with client. LCSW did leave phone message for client to please return call to LCSW at [phone]. LCSW also called phone number several times today for Jeanella Craze, son of client. However, LCSW was not able to speak via phone today with Jeanella Craze. LCSW did leave phone message for [facility] requesting that he please call LCSW at [phone]  Follow Up Plan:LCSW to call client/son of clientin next 4 weeks to talk with client/sonabout client management of depression symptoms  Norva Riffle.Nadeem Romanoski MSW, LCSW Licensed Clinical Social Worker [facility] Care Management [phone]

## 2021-01-05 NOTE — Patient Instructions (Addendum)
Licensed Clinical Social Worker Visit Information  Materials Provided: No  01/05/2021  Name: Cynthia Espinoza           MRN: [id number]       DOB: 1955/12/14  Cynthia Espinoza is a 65 y.o. year old female who is a primary care patient of Junie Spencer, FNP. The CCM team was consulted for assistance with [employer] .   Review of patient status, including review of consultants reports, other relevant assessments, and collaboration with appropriate care team members and the patient's provider was performed as part of comprehensive patient evaluation and provision of chronic care management services.    SDOH (Social Determinants of Health) assessments performed: No; risk for depression; risk for tobacco use; risk for stress; risk for physical inactivity  LCSW called client home phone number several times today but LCSW was not able to speak via phone today with client. LCSW did leave phone message for client to please return call to LCSW at [phone]. LCSW also called phone number several times today for Oleh Genin, son of client. However, LCSW was not able to speak via phone today with Oleh Genin. LCSW did leave phone message for [facility] requesting that he please call LCSW at [phone]  Follow Up Plan:LCSW to call client/son of clientin next 4 weeks to talk with client/sonabout client management of depression symptoms  LCSW was not able to speak via phone today with client or with son of client; thus, client or her son were not able to verbalize understanding of instructions provided today and were not able to accept or decline a print copy of patient instruction materials.   Kelton Pillar.Dailey Buccheri MSW, LCSW Licensed Clinical Social Worker [facility] Care Management [phone]

## 2021-01-12 ENCOUNTER — Other Ambulatory Visit: Payer: Self-pay | Admitting: *Deleted

## 2021-01-12 MED ORDER — NOVOLOG MIX 70/30 FLEXPEN (70-30) 100 UNIT/ML ~~LOC~~ SUPN: 15.0000 [IU] | PEN_INJECTOR | Freq: Two times a day (BID) | SUBCUTANEOUS | 0 refills | Status: DC

## 2021-01-18 ENCOUNTER — Telehealth: Payer: Self-pay

## 2021-01-18 MED ORDER — NOVOLOG MIX 70/30 FLEXPEN (70-30) 100 UNIT/ML ~~LOC~~ SUPN: 15.0000 [IU] | PEN_INJECTOR | Freq: Two times a day (BID) | SUBCUTANEOUS | 0 refills | Status: DC

## 2021-01-18 NOTE — Telephone Encounter (Signed)
Sent to pharmacy 

## 2021-01-19 ENCOUNTER — Telehealth: Payer: Self-pay | Admitting: *Deleted

## 2021-01-19 DIAGNOSIS — E1169 Type 2 diabetes mellitus with other specified complication: Secondary | ICD-10-CM

## 2021-01-19 DIAGNOSIS — Z794 Long term (current) use of insulin: Secondary | ICD-10-CM

## 2021-01-19 NOTE — Telephone Encounter (Signed)
from [facility] - NOVOlog 70/30 flexpen  PA started  Key: [id number] Prior Authorization Not Required  Still says waiting on clinical questions

## 2021-01-19 NOTE — Telephone Encounter (Signed)
Correct med sent yesterday - closing this encounter.

## 2021-02-03 ENCOUNTER — Other Ambulatory Visit: Payer: Self-pay | Admitting: *Deleted

## 2021-02-03 MED ORDER — NOVOLOG MIX 70/30 FLEXPEN (70-30) 100 UNIT/ML ~~LOC~~ SUPN
15.0000 [IU] | PEN_INJECTOR | Freq: Two times a day (BID) | SUBCUTANEOUS | 0 refills | Status: DC
Start: 2021-02-03 — End: 2021-02-11

## 2021-02-04 ENCOUNTER — Encounter: Payer: Self-pay | Admitting: *Deleted

## 2021-02-04 ENCOUNTER — Ambulatory Visit (INDEPENDENT_AMBULATORY_CARE_PROVIDER_SITE_OTHER): Payer: Medicare Other | Admitting: Licensed Clinical Social Worker

## 2021-02-04 DIAGNOSIS — F3341 Major depressive disorder, recurrent, in partial remission: Secondary | ICD-10-CM | POA: Diagnosis not present

## 2021-02-04 DIAGNOSIS — E039 Hypothyroidism, unspecified: Secondary | ICD-10-CM | POA: Diagnosis not present

## 2021-02-04 DIAGNOSIS — Z8659 Personal history of other mental and behavioral disorders: Secondary | ICD-10-CM

## 2021-02-04 DIAGNOSIS — Z794 Long term (current) use of insulin: Secondary | ICD-10-CM

## 2021-02-04 DIAGNOSIS — I10 Essential (primary) hypertension: Secondary | ICD-10-CM | POA: Diagnosis not present

## 2021-02-04 DIAGNOSIS — E1169 Type 2 diabetes mellitus with other specified complication: Secondary | ICD-10-CM | POA: Diagnosis not present

## 2021-02-04 NOTE — Patient Instructions (Signed)
Visit Information  PATIENT GOALS: Goals Addressed            This Visit's Progress   . Manage My Emotions       Timeframe:  Short-Term Goal Priority:  Medium Start Date:      02/04/21                       Expected End Date:     05/07/21                  Follow Up Date  03/08/21  Manage My Emotions (Patient) Manage Anxiety of Stress issues faced  Why This is important?    When you are stressed, down or upset, your body reacts too.   For example, your blood pressure may get higher; you may have a headache or stomachache.   When your emotions get the best of you, your body's ability to fight off cold and flu gets weak.   These steps will help you manage your emotions.      Patient Self Care Activities:  . Self administers medications as prescribed . Attends all scheduled provider appointments . Performs ADL's independently  Patient Coping Strengths:  . Family . Friends . Spirituality . Hopefulness  Patient Self Care Deficits:  . Challenges in managing anxiety or stress issues faced  Patient Goals:  - avoid negative self-talk - spend time or talk with others every day - practice relaxation or meditation daily - talk about feelings with a friend, family or spiritual advisor - keep a calendar with appointment dates  Follow Up Plan: LCSW to call client on 03/08/21       Kelton Pillar.Dezmond Downie MSW, LCSW Licensed Clinical Social Worker [facility] Care Management [phone]

## 2021-02-04 NOTE — Chronic Care Management (AMB) (Signed)
Chronic Care Management    Clinical Social Work Note  02/04/2021 Name: Cynthia Espinoza MRN: [id number] DOB: [birth year]  Cynthia Espinoza is a 65 y.o. year old female who is a primary care patient of [name], FNP. The CCM team was consulted to assist the patient with chronic disease management and/or care coordination needs related to: [name] .   Engaged with patient by telephone for follow up visit in response to provider referral for social work chronic care management and care coordination services.   Consent to Services:  The patient was given information about Chronic Care Management services, agreed to services, and gave verbal consent prior to initiation of services.  Please see initial visit note for detailed documentation.   Patient agreed to services and consent obtained.   Assessment: Review of patient past medical history, allergies, medications, and health status, including review of relevant consultants reports was performed today as part of a comprehensive evaluation and provision of chronic care management and care coordination services.     SDOH (Social Determinants of Health) assessments and interventions performed:    Advanced Directives Status: See Vynca application for related entries.  CCM Care Plan  Allergies  Allergen Reactions  . Metformin And Related     GI distress   . Asa [Aspirin] Rash  . Penicillins Rash    Did it involve swelling of the face/tongue/throat, SOB, or low BP? Yes Did it involve sudden or severe rash/hives, skin peeling, or any reaction on the inside of your mouth or nose? Yes Did you need to seek medical attention at a hospital or doctor's office? Yes When did it last happen?Over 10 years ago If all above answers are "NO", may proceed with cephalosporin use.     Outpatient Encounter Medications as of 02/04/2021  Medication Sig  . ALPRAZolam (XANAX) 0.5 MG tablet Take 1 tablet (0.5 mg total) by mouth at bedtime as  needed for anxiety.  [name] Kitchen amLODipine (NORVASC) 10 MG tablet Take 1 tablet (10 mg total) by mouth daily. (Needs to be seen before next refill)  . benazepril (LOTENSIN) 40 MG tablet Take 1 tablet (40 mg total) by mouth daily. (Needs to be seen before next refill)  . blood glucose meter kit and supplies Dispense based on patient and insurance preference. Use up to four times daily as directed. (FOR ICD-10 E10.9, E11.9).  . Dulaglutide (TRULICITY) 1.93 XT/0.2IO SOPN Inject 0.75 mg into the skin once a week.  . DULoxetine (CYMBALTA) 30 MG capsule Take 1 capsule (30 mg total) by mouth daily. (Needs to be seen before next refill)  . hydrochlorothiazide (HYDRODIURIL) 25 MG tablet TAKE ONE (1) TABLET EACH DAY  . insulin aspart protamine - aspart (NOVOLOG MIX 70/30 FLEXPEN) (70-30) 100 UNIT/ML FlexPen Inject 0.15-0.3 mLs (15-30 Units total) into the skin 2 (two) times daily.  . Insulin Pen Needle 29G X 5MM MISC 1 Units by Does not apply route 2 (two) times a day. Insulin needles for relion insulin pen.  . levothyroxine (SYNTHROID) 200 MCG tablet Take 1 tablet (200 mcg total) by mouth daily before breakfast. (Needs to be seen before next refill)  . meloxicam (MOBIC) 7.5 MG tablet Take 1 tablet (7.5 mg total) by mouth daily.   No facility-administered encounter medications on file as of 02/04/2021.    Patient Active Problem List   Diagnosis Date Noted  . Type 2 diabetes mellitus with other specified complication ([place]) 97/35/3299  . DKA (diabetic ketoacidoses) 04/02/2019  . History of  posttraumatic stress disorder (PTSD) 10/05/2018  . Palpitations 05/12/2017  . Premature beat 05/12/2017  . Moderate episode of recurrent major depressive disorder ([place]) 02/16/2017  . Recurrent major depressive disorder, in partial remission ([place]) 01/24/2017  . Diabetes mellitus ([place]) 01/10/2017  . Essential hypertension 08/30/2016  . Hypothyroidism 08/30/2016  . Generalized OA 08/30/2016  . Fibromyalgia 08/30/2016  .  Chronic bilateral low back pain with bilateral sciatica 08/30/2016  . Chronic pain of both knees 08/30/2016  . Pain in both wrists 08/30/2016  . Pain in joint involving ankle and foot 08/30/2016  . Pain of both shoulder joints 08/30/2016  . Morbid obesity ([place]) 08/30/2016    Conditions to be addressed/monitored: ; Monitor anxiety and stress issues of client  Care Plan : LCSW Care Plan  Updates made by Katha Cabal, LCSW since 02/04/2021 12:00 AM    Problem: Emotional Distress     Goal: Emotional Health Supported   Start Date: 02/04/2021  Expected End Date: 05/07/2021  This Visit's Progress: On track  Priority: Medium  Note:   Current Barriers:  . Chronic Mental Health needs related to anxiety and stress management . Mobility issues . Suicidal Ideation/Homicidal Ideation: No  Clinical Social Work Goal(s):  . patient will work with SW monthly by telephone or in person to reduce or manage symptoms related to anxiety and stress issues faced . Patient will communicate with RNCM as needed in next 30 days to discuss nursing needs of client . Patient will practice self care activities of choice for next 30 days  Interventions: . 1:1 collaboration with [name], FNP regarding development and update of comprehensive plan of care as evidenced by provider attestation and co-signature . Talked with client about pain management . Talked with client about ambulation of client (uses a walker to help her walk) . Talked with client about family support (has support from son, Quantavia Frith) . Talked with client about sleeping issues of client . Talked with client about coping skills of choice (likes to walk for exercise) . Talked with client about upcoming client medical appointments . Talked with client about appetite of client . Talked with client about transport needs of client . Talked with client about mood of client (she said that her mood has been generally pretty  good) . Provided counseling support for client  Patient Self Care Activities:  . Self administers medications as prescribed . Attends all scheduled provider appointments . Performs ADL's independently  Patient Coping Strengths:  . Family . Friends . Spirituality . Hopefulness  Patient Self Care Deficits:  . Challenges in managing anxiety or stress issues faced  Patient Goals:  - avoid negative self-talk - spend time or talk with others every day - practice relaxation or meditation daily - talk about feelings with a friend, family or spiritual advisor - keep a calendar with appointment dates  Follow Up Plan: LCSW to call client on 03/08/21     Norva Riffle.Latiya Navia MSW, LCSW Licensed Clinical Social Worker [facility] Care Management [phone]

## 2021-02-08 ENCOUNTER — Other Ambulatory Visit: Payer: Self-pay | Admitting: *Deleted

## 2021-02-08 DIAGNOSIS — E1169 Type 2 diabetes mellitus with other specified complication: Secondary | ICD-10-CM

## 2021-02-08 MED ORDER — INSULIN PEN NEEDLE 29G X 5MM MISC: 3 refills | Status: AC

## 2021-02-09 ENCOUNTER — Ambulatory Visit: Payer: Medicare Other | Admitting: Family

## 2021-02-10 ENCOUNTER — Telehealth: Payer: Self-pay

## 2021-02-10 NOTE — Telephone Encounter (Signed)
NOVOLOG MIX INJ FLEX REL   This drug is not on our formulary.  We will not continue to pay for this drug after you have received the maximum 30 day temporary supply.   Alternative Drug Name NOVOLOG MIX INJ 70/30 FIASP FLEX INJ TOUCH   Please advise

## 2021-02-11 ENCOUNTER — Other Ambulatory Visit: Payer: Self-pay | Admitting: *Deleted

## 2021-02-11 MED ORDER — FIASP FLEXTOUCH 100 UNIT/ML ~~LOC~~ SOPN: 15.0000 [IU] | PEN_INJECTOR | Freq: Two times a day (BID) | SUBCUTANEOUS | 2 refills | Status: DC

## 2021-02-11 NOTE — Telephone Encounter (Signed)
Novolog pen switched to [employer]. Continue current dosing.

## 2021-02-11 NOTE — Addendum Note (Signed)
Addended by: Jannifer Rodney A on: 02/11/2021 01:31 PM   Modules accepted: Orders

## 2021-02-11 NOTE — Telephone Encounter (Signed)
LMTCB

## 2021-02-17 ENCOUNTER — Ambulatory Visit: Payer: Medicare Other | Admitting: Family

## 2021-03-08 ENCOUNTER — Ambulatory Visit (INDEPENDENT_AMBULATORY_CARE_PROVIDER_SITE_OTHER): Payer: Medicare Other | Admitting: Licensed Clinical Social Worker

## 2021-03-08 DIAGNOSIS — E039 Hypothyroidism, unspecified: Secondary | ICD-10-CM

## 2021-03-08 DIAGNOSIS — E1169 Type 2 diabetes mellitus with other specified complication: Secondary | ICD-10-CM | POA: Diagnosis not present

## 2021-03-08 DIAGNOSIS — I10 Essential (primary) hypertension: Secondary | ICD-10-CM

## 2021-03-08 DIAGNOSIS — F3341 Major depressive disorder, recurrent, in partial remission: Secondary | ICD-10-CM | POA: Diagnosis not present

## 2021-03-08 DIAGNOSIS — Z8659 Personal history of other mental and behavioral disorders: Secondary | ICD-10-CM

## 2021-03-08 DIAGNOSIS — Z794 Long term (current) use of insulin: Secondary | ICD-10-CM

## 2021-03-08 NOTE — Patient Instructions (Signed)
Visit Information  PATIENT GOALS: Goals Addressed            This Visit's Progress   . Manage My Emotions; Manage anxiety and stress issues faced       Timeframe:  Short-Term Goal Priority:  Medium Progress: On Track Start Date:      02/04/21                       Expected End Date:     05/07/21                  Follow Up Date  04/14/21  Manage My Emotions (Patient) Manage Anxiety of Stress issues faced  Why This is important?    When you are stressed, down or upset, your body reacts too.   For example, your blood pressure may get higher; you may have a headache or stomachache.   When your emotions get the best of you, your body's ability to fight off cold and flu gets weak.   These steps will help you manage your emotions.     Patient Self Care Activities:  . Self administers medications as prescribed . Attends all scheduled provider appointments . Performs ADL's independently  Patient Coping Strengths:  . Family . Friends . Spirituality . Hopefulness  Patient Self Care Deficits:  . Challenges in managing anxiety or stress issues faced  Patient Goals:  - avoid negative self-talk - spend time or talk with others every day - practice relaxation or meditation daily - talk about feelings with a friend, family or spiritual advisor - keep a calendar with appointment dates  Follow Up Plan: LCSW to call client on 04/14/21       Kelton Pillar.Tenaya Hilyer MSW, LCSW Licensed Clinical Social Worker [facility] Care Management [phone]

## 2021-03-08 NOTE — Chronic Care Management (AMB) (Signed)
Chronic Care Management    Clinical Social Work Note  03/08/2021 Name: Cynthia Espinoza MRN: [id number] DOB: 08-Jan-1956  Cynthia Espinoza is a 65 y.o. year old female who is a primary care patient of [name], FNP. The CCM team was consulted to assist the patient with chronic disease management and/or care coordination needs related to: [name] .   Engaged with patient by telephone for follow up visit in response to provider referral for social work chronic care management and care coordination services.   Consent to Services:  The patient was given information about Chronic Care Management services, agreed to services, and gave verbal consent prior to initiation of services.  Please see initial visit note for detailed documentation.   Patient agreed to services and consent obtained.   Assessment: Review of patient past medical history, allergies, medications, and health status, including review of relevant consultants reports was performed today as part of a comprehensive evaluation and provision of chronic care management and care coordination services.     SDOH (Social Determinants of Health) assessments and interventions performed:  SDOH Interventions   Flowsheet Row Most Recent Value  SDOH Interventions   Depression Interventions/Treatment  Medication       Advanced Directives Status: See Vynca application for related entries.  CCM Care Plan  Allergies  Allergen Reactions  . Metformin And Related     GI distress   . Asa [Aspirin] Rash  . Penicillins Rash    Did it involve swelling of the face/tongue/throat, SOB, or low BP? Yes Did it involve sudden or severe rash/hives, skin peeling, or any reaction on the inside of your mouth or nose? Yes Did you need to seek medical attention at a hospital or doctor's office? Yes When did it last happen?Over 10 years ago If all above answers are "NO", may proceed with cephalosporin use.     Outpatient Encounter  Medications as of 03/08/2021  Medication Sig  . ALPRAZolam (XANAX) 0.5 MG tablet Take 1 tablet (0.5 mg total) by mouth at bedtime as needed for anxiety.  [name] Kitchen amLODipine (NORVASC) 10 MG tablet Take 1 tablet (10 mg total) by mouth daily. (Needs to be seen before next refill)  . benazepril (LOTENSIN) 40 MG tablet Take 1 tablet (40 mg total) by mouth daily. (Needs to be seen before next refill)  . blood glucose meter kit and supplies Dispense based on patient and insurance preference. Use up to four times daily as directed. (FOR ICD-10 E10.9, E11.9).  . Dulaglutide (TRULICITY) 3.29 JM/4.2AS SOPN Inject 0.75 mg into the skin once a week.  . DULoxetine (CYMBALTA) 30 MG capsule Take 1 capsule (30 mg total) by mouth daily. (Needs to be seen before next refill)  . hydrochlorothiazide (HYDRODIURIL) 25 MG tablet TAKE ONE (1) TABLET EACH DAY  . insulin aspart (FIASP FLEXTOUCH) 100 UNIT/ML FlexTouch Pen Inject 15-30 Units into the skin in the morning and at bedtime.  . Insulin Pen Needle 29G X 5MM MISC Use with Insulin twice daily Dx E11.69  . levothyroxine (SYNTHROID) 200 MCG tablet Take 1 tablet (200 mcg total) by mouth daily before breakfast. (Needs to be seen before next refill)  . meloxicam (MOBIC) 7.5 MG tablet Take 1 tablet (7.5 mg total) by mouth daily.   No facility-administered encounter medications on file as of 03/08/2021.    Patient Active Problem List   Diagnosis Date Noted  . Type 2 diabetes mellitus with other specified complication ([place]) 34/19/6222  . DKA (diabetic ketoacidoses)  04/02/2019  . History of posttraumatic stress disorder (PTSD) 10/05/2018  . Palpitations 05/12/2017  . Premature beat 05/12/2017  . Moderate episode of recurrent major depressive disorder ([place]) 02/16/2017  . Recurrent major depressive disorder, in partial remission ([place]) 01/24/2017  . Diabetes mellitus ([place]) 01/10/2017  . Essential hypertension 08/30/2016  . Hypothyroidism 08/30/2016  . Generalized OA 08/30/2016   . Fibromyalgia 08/30/2016  . Chronic bilateral low back pain with bilateral sciatica 08/30/2016  . Chronic pain of both knees 08/30/2016  . Pain in both wrists 08/30/2016  . Pain in joint involving ankle and foot 08/30/2016  . Pain of both shoulder joints 08/30/2016  . Morbid obesity ([place]) 08/30/2016    Conditions to be addressed/monitored:Monitor client management of anxiety and depression issues   Care Plan : LCSW Care Plan  Updates made by Katha Cabal, LCSW since 03/08/2021 12:00 AM    Problem: Emotional Distress     Goal: Emotional Health Supportedl ; Manage anxiety and stress issues   Start Date: 02/04/2021  Expected End Date: 05/07/2021  This Visit's Progress: On track  Recent Progress: On track  Priority: Medium  Note:   Current Barriers:  . Chronic Mental Health needs related to anxiety and stress management . Mobility issues . Suicidal Ideation/Homicidal Ideation: No  Clinical Social Work Goal(s):  . patient will work with SW monthly by telephone or in person to reduce or manage symptoms related to anxiety and stress issues faced . Patient will communicate with RNCM as needed in next 30 days to discuss nursing needs of client . Patient will practice self care activities of choice for next 30 days  Interventions: . 1:1 collaboration with [name], FNP regarding development and update of comprehensive plan of care as evidenced by provider attestation and co-signature . Talked with client about pain management (she spoke of pain in her right wrist) . Talked with client about ambulation of client (uses a walker to help her walk) . Talked with client about family support (has support from son, Erendida Wrenn) . Talked with client about sleeping issues of client . Talked with client about coping skills of choice (likes to walk for exercise) . Talked with client about upcoming client medical appointments . Talked with client about appetite of client . Talked  with client about transport needs of client . Talked with client about mood of client (she said that her mood has been generally pretty good) . Talked with client about social activities . Talked with client about medication procurement . Provided counseling support for client . Talked with client about self care activities  Patient Self Care Activities:  . Self administers medications as prescribed . Attends all scheduled provider appointments . Performs ADL's independently  Patient Coping Strengths:  . Family . Friends . Spirituality . Hopefulness  Patient Self Care Deficits:  . Challenges in managing anxiety or stress issues faced  Patient Goals:  - avoid negative self-talk - spend time or talk with others every day - practice relaxation or meditation daily - talk about feelings with a friend, family or spiritual advisor - keep a calendar with appointment dates  Follow Up Plan: LCSW to call client on 04/14/21     Norva Riffle.Grafton Warzecha MSW, LCSW Licensed Clinical Social Worker [facility] Care Management [phone]

## 2021-03-15 ENCOUNTER — Other Ambulatory Visit: Payer: Self-pay | Admitting: Family

## 2021-03-15 DIAGNOSIS — I10 Essential (primary) hypertension: Secondary | ICD-10-CM

## 2021-04-01 ENCOUNTER — Ambulatory Visit (INDEPENDENT_AMBULATORY_CARE_PROVIDER_SITE_OTHER): Payer: Medicare Other

## 2021-04-01 ENCOUNTER — Encounter: Payer: Self-pay | Admitting: Family

## 2021-04-01 ENCOUNTER — Ambulatory Visit (INDEPENDENT_AMBULATORY_CARE_PROVIDER_SITE_OTHER): Payer: Medicare Other | Admitting: Family

## 2021-04-01 ENCOUNTER — Other Ambulatory Visit: Payer: Self-pay

## 2021-04-01 VITALS — BP 115/70 | HR 70 | Temp 98.0°F | Ht 67.0 in | Wt 304.0 lb

## 2021-04-01 DIAGNOSIS — Z1211 Encounter for screening for malignant neoplasm of colon: Secondary | ICD-10-CM

## 2021-04-01 DIAGNOSIS — F3341 Major depressive disorder, recurrent, in partial remission: Secondary | ICD-10-CM

## 2021-04-01 DIAGNOSIS — M25562 Pain in left knee: Secondary | ICD-10-CM

## 2021-04-01 DIAGNOSIS — M797 Fibromyalgia: Secondary | ICD-10-CM

## 2021-04-01 DIAGNOSIS — Z79899 Other long term (current) drug therapy: Secondary | ICD-10-CM | POA: Diagnosis not present

## 2021-04-01 DIAGNOSIS — M25561 Pain in right knee: Secondary | ICD-10-CM

## 2021-04-01 DIAGNOSIS — Z1159 Encounter for screening for other viral diseases: Secondary | ICD-10-CM

## 2021-04-01 DIAGNOSIS — M25571 Pain in right ankle and joints of right foot: Secondary | ICD-10-CM

## 2021-04-01 DIAGNOSIS — E039 Hypothyroidism, unspecified: Secondary | ICD-10-CM

## 2021-04-01 DIAGNOSIS — I10 Essential (primary) hypertension: Secondary | ICD-10-CM

## 2021-04-01 DIAGNOSIS — G8929 Other chronic pain: Secondary | ICD-10-CM

## 2021-04-01 DIAGNOSIS — F331 Major depressive disorder, recurrent, moderate: Secondary | ICD-10-CM

## 2021-04-01 DIAGNOSIS — Z794 Long term (current) use of insulin: Secondary | ICD-10-CM

## 2021-04-01 DIAGNOSIS — M5441 Lumbago with sciatica, right side: Secondary | ICD-10-CM

## 2021-04-01 DIAGNOSIS — E1169 Type 2 diabetes mellitus with other specified complication: Secondary | ICD-10-CM | POA: Diagnosis not present

## 2021-04-01 DIAGNOSIS — M159 Polyosteoarthritis, unspecified: Secondary | ICD-10-CM

## 2021-04-01 DIAGNOSIS — M5442 Lumbago with sciatica, left side: Secondary | ICD-10-CM

## 2021-04-01 DIAGNOSIS — F1320 Sedative, hypnotic or anxiolytic dependence, uncomplicated: Secondary | ICD-10-CM | POA: Diagnosis not present

## 2021-04-01 DIAGNOSIS — E785 Hyperlipidemia, unspecified: Secondary | ICD-10-CM

## 2021-04-01 LAB — BAYER DCA HB A1C WAIVED: HB A1C (BAYER DCA - WAIVED): 8.8 % — ABNORMAL HIGH (ref <7.0)

## 2021-04-01 MED ORDER — AMLODIPINE BESYLATE 10 MG PO TABS: ORAL_TABLET | ORAL | 1 refills | Status: DC

## 2021-04-01 MED ORDER — BENAZEPRIL HCL 40 MG PO TABS
ORAL_TABLET | ORAL | 0 refills | Status: DC
Start: 2021-04-01 — End: 2021-04-20

## 2021-04-01 MED ORDER — LEVOTHYROXINE SODIUM 200 MCG PO TABS: 200.0000 ug | ORAL_TABLET | Freq: Every day | ORAL | 0 refills | Status: DC

## 2021-04-01 MED ORDER — DULOXETINE HCL 30 MG PO CPEP: 30.0000 mg | ORAL_CAPSULE | Freq: Every day | ORAL | 0 refills | Status: DC

## 2021-04-01 MED ORDER — MELOXICAM 7.5 MG PO TABS: 7.5000 mg | ORAL_TABLET | Freq: Every day | ORAL | 3 refills | Status: DC

## 2021-04-01 MED ORDER — HYDROCHLOROTHIAZIDE 25 MG PO TABS: ORAL_TABLET | ORAL | 1 refills | Status: DC

## 2021-04-01 NOTE — Patient Instructions (Signed)
Ankle Pain The ankle joint holds your body weight and allows you to move around. Ankle pain can occur on either side or the back of one ankle or both ankles. Ankle pain may be sharp and burning or dull and aching. There may be tenderness, stiffness, redness, or warmth around the ankle. Many things can cause ankle pain, including an injury to the area and overuse of the ankle. Follow these instructions at home: Activity  Rest your ankle as told by your health care provider. Avoid any activities that cause ankle pain.  Do not use the injured limb to support your body weight until your health care provider says that you can. Use crutches as told by your health care provider.  Do exercises as told by your health care provider.  Ask your health care provider when it is safe to drive if you have a brace on your ankle. If you have a brace:  Wear the brace as told by your health care provider. Remove it only as told by your health care provider.  Loosen the brace if your toes tingle, become numb, or turn cold and blue.  Keep the brace clean.  If the brace is not waterproof: ? Do not let it get wet. ? Cover it with a watertight covering when you take a bath or shower. If you were given an elastic bandage:  Remove it when you take a bath or a shower.  Try not to move your ankle very much, but wiggle your toes from time to time. This helps to prevent swelling.  Adjust the bandage to make it more comfortable if it feels too tight.  Loosen the bandage if you have numbness or tingling in your foot or if your foot turns cold and blue.   Managing pain, stiffness, and swelling  If directed, put ice on the painful area. ? If you have a removable brace or elastic bandage, remove it as told by your health care provider. ? Put ice in a plastic bag. ? Place a towel between your skin and the bag. ? Leave the ice on for 20 minutes, 2-3 times a day.  Move your toes often to avoid stiffness and to  lessen swelling.  Raise (elevate) your ankle above the level of your heart while you are sitting or lying down.   General instructions  Record information about your pain. Writing down the following may be helpful for you and your health care provider: ? How often you have ankle pain. ? Where the pain is located. ? What the pain feels like.  If treatment involves wearing a prescribed shoe or insole, make sure you wear it correctly and for as long as told by your health care provider.  Take over-the-counter and prescription medicines only as told by your health care provider.  Keep all follow-up visits as told by your health care provider. This is important. Contact a health care provider if:  Your pain gets worse.  Your pain is not relieved with medicines.  You have a fever or chills.  You are having more trouble with walking.  You have new symptoms. Get help right away if:  Your foot, leg, toes, or ankle: ? Tingles or becomes numb. ? Becomes swollen. ? Turns pale or blue. Summary  Ankle pain can occur on either side or the back of one ankle or both ankles.  Ankle pain may be sharp and burning or dull and aching.  Rest your ankle as told by your health   care provider. If told, apply ice to the area.  Take over-the-counter and prescription medicines only as told by your health care provider. This information is not intended to replace advice given to you by your health care provider. Make sure you discuss any questions you have with your health care provider. Document Revised: 02/19/2019 Document Reviewed: 05/09/2018 Elsevier Patient Education  2021 Elsevier Inc.  

## 2021-04-01 NOTE — Progress Notes (Signed)
Subjective:    Patient ID: Cynthia Espinoza, female    DOB: [birth year], 66 y.o.   MRN: [id number]  Chief Complaint  Patient presents with  . Diabetes  . Ankle Pain    Right ankle pain fell off something     Pt presents to the office today for chronic follow up. Diabetes She presents for her follow-up diabetic visit. She has type 2 diabetes mellitus. Her disease course has been stable. Pertinent negatives for hypoglycemia include no nervousness/anxiousness. Associated symptoms include fatigue. Symptoms are stable. Pertinent negatives for diabetic complications include no CVA, heart disease or peripheral neuropathy. Risk factors for coronary artery disease include dyslipidemia, diabetes mellitus, hypertension, sedentary lifestyle and post-menopausal. She is following a generally unhealthy diet. Her overall blood glucose range is 140-180 mg/dl.  Ankle Pain  The incident occurred 5 to 7 days ago. The pain is present in the right ankle. The pain is at a severity of 8/10. The pain is moderate.  Hypertension This is a chronic problem. The current episode started more than 1 year ago. The problem has been resolved since onset. The problem is controlled. Associated symptoms include peripheral edema. Pertinent negatives include no malaise/fatigue or shortness of breath. Risk factors for coronary artery disease include diabetes mellitus, obesity and dyslipidemia. The current treatment provides moderate improvement. There is no history of CVA. Identifiable causes of hypertension include a thyroid problem.  Thyroid Problem Presents for follow-up visit. Symptoms include fatigue. Patient reports no anxiety, depressed mood, dry skin or hoarse voice. The symptoms have been stable.  Back Pain This is a chronic problem. The current episode started more than 1 year ago. The problem occurs intermittently. The problem has been waxing and waning since onset. The pain is present in the lumbar spine. The quality of the  pain is described as aching. The pain is at a severity of 8/10. The pain is moderate. She has tried NSAIDs for the symptoms. The treatment provided mild relief.  Arthritis Presents for follow-up visit. She complains of pain and stiffness. The symptoms have been stable. Affected location: back. Her pain is at a severity of 10/10. Associated symptoms include fatigue.  Depression        This is a chronic problem.  The current episode started more than 1 year ago.   The onset quality is gradual.   The problem occurs intermittently.  The problem has been waxing and waning since onset.  Associated symptoms include fatigue, restlessness and decreased interest.  Associated symptoms include no helplessness, no hopelessness and not sad.  Past treatments include SNRIs - Serotonin and norepinephrine reuptake inhibitors.  Past medical history includes thyroid problem.       Review of Systems  Constitutional: Positive for fatigue. Negative for malaise/fatigue.  HENT: Negative for hoarse voice.   Respiratory: Negative for shortness of breath.   Musculoskeletal: Positive for arthritis, back pain and stiffness.  Psychiatric/Behavioral: Positive for depression. The patient is not nervous/anxious.   All other systems reviewed and are negative.  Family History  Problem Relation Age of Onset  . Hypertension Mother   . Thyroid disease Mother   . Osteoporosis Mother    Social History   Socioeconomic History  . Marital status: Single    Spouse name: Not on file  . Number of children: Not on file  . Years of education: Not on file  . Highest education level: Not on file  Occupational History  . Not on file  Tobacco Use  . Smoking  status: Never Smoker  . Smokeless tobacco: Never Used  Vaping Use  . Vaping Use: Never used  Substance and Sexual Activity  . Alcohol use: No  . Drug use: No  . Sexual activity: Not on file  Other Topics Concern  . Not on file  Social History Narrative  . Not on file    Social Determinants of Health   Financial Resource Strain: Not on file  Food Insecurity: Not on file  Transportation Needs: Not on file  Physical Activity: Not on file  Stress: Not on file  Social Connections: Not on file       Objective:   Physical Exam Vitals reviewed.  Constitutional:      General: She is not in acute distress.    Appearance: She is well-developed. She is obese.  HENT:     Head: Normocephalic and atraumatic.     Right Ear: Tympanic membrane normal.     Left Ear: Tympanic membrane normal.  Eyes:     Pupils: Pupils are equal, round, and reactive to light.  Neck:     Thyroid: No thyromegaly.  Cardiovascular:     Rate and Rhythm: Normal rate and regular rhythm.     Heart sounds: Normal heart sounds. No murmur heard.   Pulmonary:     Effort: Pulmonary effort is normal. No respiratory distress.     Breath sounds: Normal breath sounds. No wheezing.  Abdominal:     General: Bowel sounds are normal. There is no distension.     Palpations: Abdomen is soft.     Tenderness: There is no abdominal tenderness.  Musculoskeletal:        General: Tenderness (mild swelling and pain in medial right ankle ) present.     Cervical back: Normal range of motion and neck supple.  Skin:    General: Skin is warm and dry.  Neurological:     Mental Status: She is alert and oriented to person, place, and time.     Cranial Nerves: No cranial nerve deficit.     Deep Tendon Reflexes: Reflexes are normal and symmetric.  Psychiatric:        Behavior: Behavior normal.        Thought Content: Thought content normal.        Judgment: Judgment normal.     Diabetic Foot Exam - Simple   Simple Foot Form Diabetic Foot exam was performed with the following findings: Yes 04/01/2021  4:04 PM  Visual Inspection No deformities, no ulcerations, no other skin breakdown bilaterally: Yes Sensation Testing Intact to touch and monofilament testing bilaterally: Yes Pulse Check Posterior  Tibialis and Dorsalis pulse intact bilaterally: Yes Comments      BP 115/70   Pulse 70   Temp 98 F (36.7 C) (Temporal)   Ht _0  (1.702 m)   Wt (!) 304 lb (137.9 kg)   BMI 47.61 kg/m      Assessment & Plan:  Cynthia Espinoza comes in today with chief complaint of Diabetes and Ankle Pain (Right ankle pain fell off something )   Diagnosis and orders addressed:  1. Recurrent major depressive disorder, in partial remission (HCC) - CMP14+EGFR - CBC with Differential/Platelet  2. Controlled substance agreement signed - CMP14+EGFR - CBC with Differential/Platelet  3. Benzodiazepine dependence (HCC) - CMP14+EGFR - CBC with Differential/Platelet  4. Chronic pain of both knees - meloxicam (MOBIC) 7.5 MG tablet; Take 1 tablet (7.5 mg total) by mouth daily.  Dispense: 90 tablet; Refill: 3 -  CMP14+EGFR - CBC with Differential/Platelet  5. Fibromyalgia - meloxicam (MOBIC) 7.5 MG tablet; Take 1 tablet (7.5 mg total) by mouth daily.  Dispense: 90 tablet; Refill: 3 - DULoxetine (CYMBALTA) 30 MG capsule; Take 1 capsule (30 mg total) by mouth daily. (Needs to be seen before next refill)  Dispense: 30 capsule; Refill: 0 - CMP14+EGFR - CBC with Differential/Platelet  6. Essential hypertension - hydrochlorothiazide (HYDRODIURIL) 25 MG tablet; TAKE ONE (1) TABLET EACH DAY  Dispense: 90 tablet; Refill: 1 - benazepril (LOTENSIN) 40 MG tablet; TAKE ONE (1) TABLET EACH DAY  Dispense: 90 tablet; Refill: 0 - amLODipine (NORVASC) 10 MG tablet; TAKE ONE (1) TABLET EACH DAY  Dispense: 90 tablet; Refill: 1 - CMP14+EGFR - CBC with Differential/Platelet  7. Hypothyroidism, unspecified type - levothyroxine (SYNTHROID) 200 MCG tablet; Take 1 tablet (200 mcg total) by mouth daily before breakfast. (Needs to be seen before next refill)  Dispense: 30 tablet; Refill: 0 - CMP14+EGFR - CBC with Differential/Platelet  8. Type 2 diabetes mellitus with other specified complication, without long-term  current use of insulin (HCC) - Bayer DCA Hb A1c Waived - CMP14+EGFR - CBC with Differential/Platelet - Microalbumin / creatinine urine ratio  9. Morbid obesity ([place]) - CMP14+EGFR - CBC with Differential/Platelet  10. Moderate episode of recurrent major depressive disorder (HCC) - CMP14+EGFR - CBC with Differential/Platelet  11. Type 2 diabetes mellitus with other specified complication, with long-term current use of insulin (HCC) - CMP14+EGFR - CBC with Differential/Platelet  12. Chronic bilateral low back pain with bilateral sciatica - CMP14+EGFR - CBC with Differential/Platelet  13. Generalized OA - CMP14+EGFR - CBC with Differential/Platelet  14. Colon cancer screening - CMP14+EGFR - CBC with Differential/Platelet - Cologuard  15. Need for hepatitis C screening test - CMP14+EGFR - CBC with Differential/Platelet - Hepatitis C antibody  16. Hyperlipidemia associated with type 2 diabetes mellitus (HCC) - CMP14+EGFR - CBC with Differential/Platelet - Lipid panel  17. Acute right ankle pain - DG Ankle Complete Right; Future   Labs pending Health Maintenance reviewed Diet and exercise encouraged  Follow up plan: 3 months    Evelina Dun, FNP

## 2021-04-02 ENCOUNTER — Other Ambulatory Visit: Payer: Self-pay | Admitting: Family Medicine

## 2021-04-02 ENCOUNTER — Other Ambulatory Visit: Payer: Self-pay | Admitting: Family

## 2021-04-02 ENCOUNTER — Telehealth: Payer: Self-pay | Admitting: Family

## 2021-04-02 LAB — CMP14+EGFR
ALT: 41 IU/L — ABNORMAL HIGH (ref 0–32)
AST: 34 IU/L (ref 0–40)
Albumin/Globulin Ratio: 1.3 (ref 1.2–2.2)
Albumin: 4.3 g/dL (ref 3.8–4.8)
Alkaline Phosphatase: 58 IU/L (ref 44–121)
BUN/Creatinine Ratio: 16 (ref 12–28)
BUN: 13 mg/dL (ref 8–27)
Bilirubin Total: 0.4 mg/dL (ref 0.0–1.2)
CO2: 24 mmol/L (ref 20–29)
Calcium: 9.6 mg/dL (ref 8.7–10.3)
Chloride: 101 mmol/L (ref 96–106)
Creatinine, Ser: 0.79 mg/dL (ref 0.57–1.00)
Globulin, Total: 3.3 g/dL (ref 1.5–4.5)
Glucose: 159 mg/dL — ABNORMAL HIGH (ref 65–99)
Potassium: 4.1 mmol/L (ref 3.5–5.2)
Sodium: 142 mmol/L (ref 134–144)
Total Protein: 7.6 g/dL (ref 6.0–8.5)
eGFR: 85 mL/min/{1.73_m2} (ref >59)

## 2021-04-02 LAB — CBC WITH DIFFERENTIAL/PLATELET
Basophils Absolute: 0 10*3/uL (ref 0.0–0.2)
Basos: 0 %
EOS (ABSOLUTE): 0.2 10*3/uL (ref 0.0–0.4)
Eos: 2 %
Hematocrit: 41.9 % (ref 34.0–46.6)
Hemoglobin: 14 g/dL (ref 11.1–15.9)
Immature Grans (Abs): 0 10*3/uL (ref 0.0–0.1)
Immature Granulocytes: 0 %
Lymphocytes Absolute: 2.9 10*3/uL (ref 0.7–3.1)
Lymphs: 32 %
MCH: 30.8 pg (ref 26.6–33.0)
MCHC: 33.4 g/dL (ref 31.5–35.7)
MCV: 92 fL (ref 79–97)
Monocytes Absolute: 0.7 10*3/uL (ref 0.1–0.9)
Monocytes: 8 %
Neutrophils Absolute: 5.3 10*3/uL (ref 1.4–7.0)
Neutrophils: 58 %
Platelets: 265 10*3/uL (ref 150–450)
RBC: 4.54 x10E6/uL (ref 3.77–5.28)
RDW: 12.6 % (ref 11.7–15.4)
WBC: 9.2 10*3/uL (ref 3.4–10.8)

## 2021-04-02 LAB — MICROALBUMIN / CREATININE URINE RATIO
Creatinine, Urine: 87.5 mg/dL
Microalb/Creat Ratio: 5 mg/g creat (ref 0–29)
Microalbumin, Urine: 4.3 ug/mL

## 2021-04-02 LAB — LIPID PANEL
Chol/HDL Ratio: 4.7 ratio — ABNORMAL HIGH (ref 0.0–4.4)
Cholesterol, Total: 197 mg/dL (ref 100–199)
HDL: 42 mg/dL (ref >39)
LDL Chol Calc (NIH): 104 mg/dL — ABNORMAL HIGH (ref 0–99)
Triglycerides: 300 mg/dL — ABNORMAL HIGH (ref 0–149)
VLDL Cholesterol Cal: 51 mg/dL — ABNORMAL HIGH (ref 5–40)

## 2021-04-02 LAB — HEPATITIS C ANTIBODY: Hep C Virus Ab: <0.1 s/co ratio (ref 0.0–0.9)

## 2021-04-02 MED ORDER — EMPAGLIFLOZIN 10 MG PO TABS: 10.0000 mg | ORAL_TABLET | Freq: Every day | ORAL | 2 refills | Status: DC

## 2021-04-02 MED ORDER — BUSPIRONE HCL 5 MG PO TABS: 5.0000 mg | ORAL_TABLET | Freq: Three times a day (TID) | ORAL | 1 refills | Status: DC | PRN

## 2021-04-02 MED ORDER — ROSUVASTATIN CALCIUM 5 MG PO TABS: 5.0000 mg | ORAL_TABLET | Freq: Every day | ORAL | 3 refills | Status: DC

## 2021-04-02 MED ORDER — BLOOD GLUCOSE METER KIT: PACK | 0 refills | Status: DC

## 2021-04-02 MED ORDER — BLOOD GLUCOSE METER KIT: PACK | 0 refills | Status: AC

## 2021-04-02 NOTE — Telephone Encounter (Signed)
Buspar 5 mg TID Prn Prescription sent to pharmacy. Please fax glucose meter rx.

## 2021-04-02 NOTE — Telephone Encounter (Signed)
Im pretty sure it was buspar what dosage did you want to send in?

## 2021-04-02 NOTE — Telephone Encounter (Signed)
Patient aware and verbalized understanding. °

## 2021-04-02 NOTE — Telephone Encounter (Signed)
Pt said that the drug store in [place] did not have the nerve pill that [place] called in. She is unsure of the name but it was supposed to be replacing the xanax that she had been taking. Pt is also needing a new prescription sent over for the blood glucose meter and kit because she said the one that she has is getting old and worn out but insurance will only pay if the pharmacy gets a new prescription.

## 2021-04-04 LAB — TSH: TSH: 4.74 u[IU]/mL — ABNORMAL HIGH (ref 0.450–4.500)

## 2021-04-04 LAB — SPECIMEN STATUS REPORT

## 2021-04-05 ENCOUNTER — Other Ambulatory Visit: Payer: Self-pay | Admitting: Family

## 2021-04-05 ENCOUNTER — Other Ambulatory Visit: Payer: Self-pay

## 2021-04-05 MED ORDER — LEVOTHYROXINE SODIUM 25 MCG PO TABS: 25.0000 ug | ORAL_TABLET | Freq: Every day | ORAL | 1 refills | Status: DC

## 2021-04-14 ENCOUNTER — Telehealth: Payer: Medicare Other

## 2021-04-19 ENCOUNTER — Other Ambulatory Visit: Payer: Self-pay | Admitting: Family

## 2021-04-19 DIAGNOSIS — I10 Essential (primary) hypertension: Secondary | ICD-10-CM

## 2021-05-05 ENCOUNTER — Other Ambulatory Visit: Payer: Self-pay | Admitting: Family

## 2021-05-07 ENCOUNTER — Other Ambulatory Visit: Payer: Self-pay | Admitting: Family

## 2021-05-07 DIAGNOSIS — M797 Fibromyalgia: Secondary | ICD-10-CM

## 2021-05-09 ENCOUNTER — Other Ambulatory Visit: Payer: Self-pay | Admitting: Family

## 2021-05-28 ENCOUNTER — Ambulatory Visit (INDEPENDENT_AMBULATORY_CARE_PROVIDER_SITE_OTHER): Payer: Medicare Other | Admitting: Licensed Clinical Social Worker

## 2021-05-28 DIAGNOSIS — F3341 Major depressive disorder, recurrent, in partial remission: Secondary | ICD-10-CM | POA: Diagnosis not present

## 2021-05-28 DIAGNOSIS — E1169 Type 2 diabetes mellitus with other specified complication: Secondary | ICD-10-CM

## 2021-05-28 DIAGNOSIS — E039 Hypothyroidism, unspecified: Secondary | ICD-10-CM

## 2021-05-28 DIAGNOSIS — I10 Essential (primary) hypertension: Secondary | ICD-10-CM | POA: Diagnosis not present

## 2021-05-28 DIAGNOSIS — Z8659 Personal history of other mental and behavioral disorders: Secondary | ICD-10-CM

## 2021-05-28 NOTE — Chronic Care Management (AMB) (Signed)
Chronic Care Management    Clinical Social Work Note  05/28/2021 Name: Cynthia Espinoza MRN: [id number] DOB: [birth year]  Cynthia Espinoza is a 65 y.o. year old female who is a primary care patient of [name], FNP. The CCM team was consulted to assist the patient with chronic disease management and/or care coordination needs related to: [name] .   Engaged with patient by telephone for follow up visit in response to provider referral for social work chronic care management and care coordination services.   Consent to Services:  The patient was given information about Chronic Care Management services, agreed to services, and gave verbal consent prior to initiation of services.  Please see initial visit note for detailed documentation.   Patient agreed to services and consent obtained.   Assessment: Review of patient past medical history, allergies, medications, and health status, including review of relevant consultants reports was performed today as part of a comprehensive evaluation and provision of chronic care management and care coordination services.     SDOH (Social Determinants of Health) assessments and interventions performed:  SDOH Interventions    Flowsheet Row Most Recent Value  SDOH Interventions   Depression Interventions/Treatment  Currently on Treatment        Advanced Directives Status: See Vynca application for related entries.  CCM Care Plan  Allergies  Allergen Reactions   Metformin And Related     GI distress    Asa [Aspirin] Rash   Penicillins Rash    Did it involve swelling of the face/tongue/throat, SOB, or low BP? Yes Did it involve sudden or severe rash/hives, skin peeling, or any reaction on the inside of your mouth or nose? Yes Did you need to seek medical attention at a hospital or doctor's office? Yes When did it last happen? Over 10 years ago If all above answers are "NO", may proceed with cephalosporin use.     Outpatient  Encounter Medications as of 05/28/2021  Medication Sig   amLODipine (NORVASC) 10 MG tablet TAKE ONE (1) TABLET EACH DAY   benazepril (LOTENSIN) 40 MG tablet TAKE ONE (1) TABLET EACH DAY   blood glucose meter kit and supplies Dispense based on patient and insurance preference. Use up to four times daily as directed. (FOR ICD-10 E10.9, E11.9).   busPIRone (BUSPAR) 5 MG tablet TAKE ONE TABLET BY MOUTH THREE TIMES DAILY AS NEEDED   DULoxetine (CYMBALTA) 30 MG capsule Take 1 capsule (30 mg total) by mouth daily.   empagliflozin (JARDIANCE) 10 MG TABS tablet Take 1 tablet (10 mg total) by mouth daily before breakfast.   hydrochlorothiazide (HYDRODIURIL) 25 MG tablet TAKE ONE (1) TABLET EACH DAY   insulin aspart (FIASP FLEXTOUCH) 100 UNIT/ML FlexTouch Pen Inject 15-30 Units into the skin in the morning and at bedtime.   Insulin Pen Needle 29G X 5MM MISC Use with Insulin twice daily Dx E11.69   levothyroxine (SYNTHROID) 200 MCG tablet Take 1 tablet (200 mcg total) by mouth daily before breakfast. (Needs to be seen before next refill)   levothyroxine (SYNTHROID) 25 MCG tablet Take 1 tablet (25 mcg total) by mouth daily before breakfast.   meloxicam (MOBIC) 7.5 MG tablet Take 1 tablet (7.5 mg total) by mouth daily.   rosuvastatin (CRESTOR) 5 MG tablet Take 1 tablet (5 mg total) by mouth daily.   No facility-administered encounter medications on file as of 05/28/2021.    Patient Active Problem List   Diagnosis Date Noted   Type 2 diabetes mellitus  with other specified complication ([place]) 50/56/9794   DKA (diabetic ketoacidoses) 04/02/2019   History of posttraumatic stress disorder (PTSD) 10/05/2018   Palpitations 05/12/2017   Premature beat 05/12/2017   Moderate episode of recurrent major depressive disorder ([place]) 02/16/2017   Recurrent major depressive disorder, in partial remission ([place]) 01/24/2017   Diabetes mellitus ([place]) 01/10/2017   Essential hypertension 08/30/2016   Hypothyroidism 08/30/2016    Generalized OA 08/30/2016   Fibromyalgia 08/30/2016   Chronic bilateral low back pain with bilateral sciatica 08/30/2016   Chronic pain of both knees 08/30/2016   Pain in both wrists 08/30/2016   Pain in joint involving ankle and foot 08/30/2016   Pain of both shoulder joints 08/30/2016   Morbid obesity ([place]) 08/30/2016    Conditions to be addressed/monitored: Monitor client management of depression and anxiety issues   Care Plan : LCSW Care Plan  Updates made by Katha Cabal, LCSW since 05/28/2021 12:00 AM   Problem: Emotional Distress         Start Date: 05/28/2021  Expected End Date: 08/17/2021  This Visit's Progress: On track  Recent Progress: On track  Priority: Medium  Note:   Current Barriers:  Chronic Mental Health needs related to anxiety and stress management Mobility issues Suicidal Ideation/Homicidal Ideation: No  Clinical Social Work Goal(s):  patient will work with SW monthly by telephone or in person to reduce or manage symptoms related to anxiety and stress issues faced Patient will communicate with RNCM as needed in next 30 days to discuss nursing needs of client Patient will practice self care activities of choice for next 30 days  Interventions: 1:1 collaboration with [name], FNP regarding development and update of comprehensive plan of care as evidenced by provider attestation and co-signature Talked with client about pain management (she spoke of pain in her right wrist) Talked with client about brace she uses for her right wrist and hand Talked with client about ambulation of client (uses a walker to help her walk) Talked with client about foot issue. She said she has bone spurs in right ankle Talked with client about family support (has support from son, [name], has support from daughter in law) Talked with client about sleeping issues of client Talked with client about coping skills of choice (likes to walk for exercise) Talked  with client about upcoming client medical appointments Talked with client about appetite of client Talked with client about transport needs of client (she said she still drives car for short trip or errands) Talked with client about mood of client (she said that her mood has been generally pretty good) Talked with client about social activities of choice Talked with client about medication procurement Provided counseling support for client Talked with client about self care activities Talked with client about relaxation in activities (likes to read, likes to do word puzzles, likes to walk, like visit with family ) Talked with client about her daily walking  Talked with client about support with RNCM Talked with client about phone calls with her cousins Talked with client about her monthly Disability benefits received. She said she is receiving monthly disability benefit and this financial help is very useful to her at present. She also has a great deal of family support through visits and phone calls  Patient Self Care Activities:  Self administers medications as prescribed Attends all scheduled provider appointments Performs ADL's independently  Patient Coping Strengths:  Family Friends Spirituality Hopefulness  Patient Self Care Deficits:  Challenges  in managing anxiety or stress issues faced  Patient Goals:  - avoid negative self-talk - spend time or talk with others every day - practice relaxation or meditation daily - talk about feelings with a friend, family or spiritual advisor - keep a calendar with appointment dates  Follow Up Plan: LCSW to call client on 07/07/21     Norva Riffle.Layten Aiken MSW, LCSW Licensed Clinical Social Worker [facility] Care Management [phone]

## 2021-05-28 NOTE — Patient Instructions (Signed)
Visit Information  PATIENT GOALS:  Goals Addressed             This Visit's Progress    Manage My Emotions; Manage anxiety and stress issues faced       Timeframe:  Short-Term Goal Priority:  Medium Progress: On Track Start Date:      05/28/21                       Expected End Date:    08/17/21                  Follow Up Date  07/07/21  Manage My Emotions (Patient) Manage Anxiety of Stress issues faced  Why This is important?   When you are stressed, down or upset, your body reacts too.  For example, your blood pressure may get higher; you may have a headache or stomachache.  When your emotions get the best of you, your body's ability to fight off cold and flu gets weak.  These steps will help you manage your emotions.     Patient Self Care Activities:  Self administers medications as prescribed Attends all scheduled provider appointments Performs ADL's independently  Patient Coping Strengths:  Family Friends Spirituality Hopefulness  Patient Self Care Deficits:  Challenges in managing anxiety or stress issues faced  Patient Goals:  - avoid negative self-talk - spend time or talk with others every day - practice relaxation or meditation daily - talk about feelings with a friend, family or spiritual advisor - keep a calendar with appointment dates  Follow Up Plan: LCSW to call client on 07/07/21           Kelton Pillar.Amirr Achord MSW, LCSW Licensed Clinical Social Worker [facility] Care Management [phone]

## 2021-06-07 ENCOUNTER — Other Ambulatory Visit: Payer: Self-pay | Admitting: Family

## 2021-06-07 DIAGNOSIS — E039 Hypothyroidism, unspecified: Secondary | ICD-10-CM

## 2021-06-08 ENCOUNTER — Encounter: Payer: Self-pay | Admitting: Family Medicine

## 2021-06-09 ENCOUNTER — Ambulatory Visit: Payer: Medicare Other | Admitting: Family

## 2021-06-25 ENCOUNTER — Ambulatory Visit: Payer: Medicare Other | Admitting: Family

## 2021-06-29 ENCOUNTER — Other Ambulatory Visit: Payer: Self-pay | Admitting: Family

## 2021-07-07 ENCOUNTER — Telehealth: Payer: Medicare Other

## 2021-07-08 ENCOUNTER — Ambulatory Visit: Payer: Medicare Other | Admitting: Family

## 2021-07-12 ENCOUNTER — Other Ambulatory Visit: Payer: Self-pay | Admitting: Family

## 2021-07-12 DIAGNOSIS — I10 Essential (primary) hypertension: Secondary | ICD-10-CM

## 2021-07-12 DIAGNOSIS — E039 Hypothyroidism, unspecified: Secondary | ICD-10-CM

## 2021-07-22 ENCOUNTER — Other Ambulatory Visit: Payer: Self-pay

## 2021-07-22 ENCOUNTER — Encounter: Payer: Self-pay | Admitting: Family

## 2021-07-22 ENCOUNTER — Ambulatory Visit (INDEPENDENT_AMBULATORY_CARE_PROVIDER_SITE_OTHER): Payer: Medicare Other | Admitting: Family

## 2021-07-22 VITALS — BP 129/80 | HR 76 | Temp 98.1°F | Ht 67.0 in | Wt 301.6 lb

## 2021-07-22 DIAGNOSIS — F3341 Major depressive disorder, recurrent, in partial remission: Secondary | ICD-10-CM

## 2021-07-22 DIAGNOSIS — G8929 Other chronic pain: Secondary | ICD-10-CM

## 2021-07-22 DIAGNOSIS — M159 Polyosteoarthritis, unspecified: Secondary | ICD-10-CM

## 2021-07-22 DIAGNOSIS — M5442 Lumbago with sciatica, left side: Secondary | ICD-10-CM

## 2021-07-22 DIAGNOSIS — M797 Fibromyalgia: Secondary | ICD-10-CM | POA: Diagnosis not present

## 2021-07-22 DIAGNOSIS — I10 Essential (primary) hypertension: Secondary | ICD-10-CM | POA: Diagnosis not present

## 2021-07-22 DIAGNOSIS — M5441 Lumbago with sciatica, right side: Secondary | ICD-10-CM

## 2021-07-22 DIAGNOSIS — Z794 Long term (current) use of insulin: Secondary | ICD-10-CM

## 2021-07-22 DIAGNOSIS — E039 Hypothyroidism, unspecified: Secondary | ICD-10-CM

## 2021-07-22 DIAGNOSIS — M25561 Pain in right knee: Secondary | ICD-10-CM

## 2021-07-22 DIAGNOSIS — M25562 Pain in left knee: Secondary | ICD-10-CM

## 2021-07-22 DIAGNOSIS — E1169 Type 2 diabetes mellitus with other specified complication: Secondary | ICD-10-CM

## 2021-07-22 LAB — BAYER DCA HB A1C WAIVED: HB A1C (BAYER DCA - WAIVED): 8.4 % — ABNORMAL HIGH (ref 4.8–5.6)

## 2021-07-22 MED ORDER — BUSPIRONE HCL 5 MG PO TABS: 5.0000 mg | ORAL_TABLET | Freq: Three times a day (TID) | ORAL | 1 refills | Status: DC | PRN

## 2021-07-22 MED ORDER — AMLODIPINE BESYLATE 10 MG PO TABS: ORAL_TABLET | ORAL | 1 refills | Status: DC

## 2021-07-22 MED ORDER — BENAZEPRIL HCL 40 MG PO TABS: ORAL_TABLET | ORAL | 1 refills | Status: DC

## 2021-07-22 MED ORDER — HYDROCHLOROTHIAZIDE 25 MG PO TABS: ORAL_TABLET | ORAL | 1 refills | Status: DC

## 2021-07-22 MED ORDER — MELOXICAM 7.5 MG PO TABS: 7.5000 mg | ORAL_TABLET | Freq: Every day | ORAL | 3 refills | Status: DC

## 2021-07-22 NOTE — Patient Instructions (Signed)
Diabetes Mellitus and Nutrition, Adult When you have diabetes, or diabetes mellitus, it is very important to have healthy eating habits because your blood sugar (glucose) levels are greatly affected by what you eat and drink. Eating healthy foods in the right amounts, at about the same times every day, can help you:  Control your blood glucose.  Lower your risk of heart disease.  Improve your blood pressure.  Reach or maintain a healthy weight. What can affect my meal plan? Every person with diabetes is different, and each person has different needs for a meal plan. Your health care provider may recommend that you work with a dietitian to make a meal plan that is best for you. Your meal plan may vary depending on factors such as:  The calories you need.  The medicines you take.  Your weight.  Your blood glucose, blood pressure, and cholesterol levels.  Your activity level.  Other health conditions you have, such as heart or kidney disease. How do carbohydrates affect me? Carbohydrates, also called carbs, affect your blood glucose level more than any other type of food. Eating carbs naturally raises the amount of glucose in your blood. Carb counting is a method for keeping track of how many carbs you eat. Counting carbs is important to keep your blood glucose at a healthy level, especially if you use insulin or take certain oral diabetes medicines. It is important to know how many carbs you can safely have in each meal. This is different for every person. Your dietitian can help you calculate how many carbs you should have at each meal and for each snack. How does alcohol affect me? Alcohol can cause a sudden decrease in blood glucose (hypoglycemia), especially if you use insulin or take certain oral diabetes medicines. Hypoglycemia can be a life-threatening condition. Symptoms of hypoglycemia, such as sleepiness, dizziness, and confusion, are similar to symptoms of having too much  alcohol.  Do not drink alcohol if: ? Your health care provider tells you not to drink. ? You are pregnant, may be pregnant, or are planning to become pregnant.  If you drink alcohol: ? Do not drink on an empty stomach. ? Limit how much you use to:  0-1 drink a day for women.  0-2 drinks a day for men. ? Be aware of how much alcohol is in your drink. In the [place] one drink equals one 12 oz bottle of beer (355 mL), one 5 oz glass of wine (148 mL), or one 1 oz glass of hard liquor (44 mL). ? Keep yourself hydrated with water, diet soda, or unsweetened iced tea.  Keep in mind that regular soda, juice, and other mixers may contain a lot of sugar and must be counted as carbs. What are tips for following this plan? Reading food labels  Start by checking the serving size on the "Nutrition Facts" label of packaged foods and drinks. The amount of calories, carbs, fats, and other nutrients listed on the label is based on one serving of the item. Many items contain more than one serving per package.  Check the total grams (g) of carbs in one serving. You can calculate the number of servings of carbs in one serving by dividing the total carbs by 15. For example, if a food has 30 g of total carbs per serving, it would be equal to 2 servings of carbs.  Check the number of grams (g) of saturated fats and trans fats in one serving. Choose foods that have   a low amount or none of these fats.  Check the number of milligrams (mg) of salt (sodium) in one serving. Most people should limit total sodium intake to less than 2,300 mg per day.  Always check the nutrition information of foods labeled as "low-fat" or "nonfat." These foods may be higher in added sugar or refined carbs and should be avoided.  Talk to your dietitian to identify your daily goals for nutrients listed on the label. Shopping  Avoid buying canned, pre-made, or processed foods. These foods tend to be high in fat, sodium, and added  sugar.  Shop around the outside edge of the grocery store. This is where you will most often find fresh fruits and vegetables, bulk grains, fresh meats, and fresh dairy. Cooking  Use low-heat cooking methods, such as baking, instead of high-heat cooking methods like deep frying.  Cook using healthy oils, such as olive, canola, or sunflower oil.  Avoid cooking with butter, cream, or high-fat meats. Meal planning  Eat meals and snacks regularly, preferably at the same times every day. Avoid going long periods of time without eating.  Eat foods that are high in fiber, such as fresh fruits, vegetables, beans, and whole grains. Talk with your dietitian about how many servings of carbs you can eat at each meal.  Eat 4-6 oz (112-168 g) of lean protein each day, such as lean meat, chicken, fish, eggs, or tofu. One ounce (oz) of lean protein is equal to: ? 1 oz (28 g) of meat, chicken, or fish. ? 1 egg. ?  cup (62 g) of tofu.  Eat some foods each day that contain healthy fats, such as avocado, nuts, seeds, and fish.   What foods should I eat? Fruits Berries. Apples. Oranges. Peaches. Apricots. Plums. Grapes. Mango. Papaya. Pomegranate. Kiwi. Cherries. Vegetables Lettuce. Spinach. Leafy greens, including kale, chard, collard greens, and mustard greens. Beets. Cauliflower. Cabbage. Broccoli. Carrots. Green beans. Tomatoes. Peppers. Onions. Cucumbers. Brussels sprouts. Grains Whole grains, such as whole-wheat or whole-grain bread, crackers, tortillas, cereal, and pasta. Unsweetened oatmeal. Quinoa. Brown or wild rice. Meats and other proteins Seafood. Poultry without skin. Lean cuts of poultry and beef. Tofu. Nuts. Seeds. Dairy Low-fat or fat-free dairy products such as milk, yogurt, and cheese. The items listed above may not be a complete list of foods and beverages you can eat. Contact a dietitian for more information. What foods should I avoid? Fruits Fruits canned with  syrup. Vegetables Canned vegetables. Frozen vegetables with butter or cream sauce. Grains Refined white flour and flour products such as bread, pasta, snack foods, and cereals. Avoid all processed foods. Meats and other proteins Fatty cuts of meat. Poultry with skin. Breaded or fried meats. Processed meat. Avoid saturated fats. Dairy Full-fat yogurt, cheese, or milk. Beverages Sweetened drinks, such as soda or iced tea. The items listed above may not be a complete list of foods and beverages you should avoid. Contact a dietitian for more information. Questions to ask a health care provider  Do I need to meet with a diabetes educator?  Do I need to meet with a dietitian?  What number can I call if I have questions?  When are the best times to check my blood glucose? Where to find more information:  American Diabetes Association: diabetes.org  Academy of Nutrition and Dietetics: www.eatright.org  National Institute of Diabetes and Digestive and Kidney Diseases: www.niddk.nih.gov  Association of Diabetes Care and Education Specialists: www.diabeteseducator.org Summary  It is important to have healthy eating   habits because your blood sugar (glucose) levels are greatly affected by what you eat and drink.  A healthy meal plan will help you control your blood glucose and maintain a healthy lifestyle.  Your health care provider may recommend that you work with a dietitian to make a meal plan that is best for you.  Keep in mind that carbohydrates (carbs) and alcohol have immediate effects on your blood glucose levels. It is important to count carbs and to use alcohol carefully. This information is not intended to replace advice given to you by your health care provider. Make sure you discuss any questions you have with your health care provider. Document Revised: 10/08/2019 Document Reviewed: 10/08/2019 Elsevier Patient Education  2021 Elsevier Inc.  

## 2021-07-22 NOTE — Progress Notes (Signed)
Subjective:    Patient ID: Cynthia Espinoza, female    DOB: [birth year], 65 y.o.   MRN: [id number] Chief Complaint  Patient presents with   Hypothyroidism    Follow up    Pt presents to the office today for chronic follow up. Hypertension This is a chronic problem. The current episode started more than 1 year ago. The problem has been resolved since onset. The problem is controlled. Associated symptoms include anxiety. Pertinent negatives include no blurred vision, malaise/fatigue, peripheral edema or shortness of breath. Risk factors for coronary artery disease include dyslipidemia, obesity and sedentary lifestyle. The current treatment provides moderate improvement. Identifiable causes of hypertension include a thyroid problem.  Diabetes She presents for her follow-up diabetic visit. She has type 2 diabetes mellitus. Hypoglycemia symptoms include nervousness/anxiousness. Pertinent negatives for diabetes include no blurred vision, no fatigue and no foot paresthesias. There are no hypoglycemic complications. Symptoms are stable. Diabetic complications include peripheral neuropathy. Pertinent negatives for diabetic complications include no heart disease. Risk factors for coronary artery disease include dyslipidemia, diabetes mellitus, hypertension, sedentary lifestyle and post-menopausal. She is following a generally unhealthy diet. Her dinner blood glucose range is generally 140-180 mg/dl. Eye exam is not current.  Thyroid Problem Presents for follow-up visit. Symptoms include anxiety. Patient reports no constipation, dry skin, fatigue or hoarse voice. The symptoms have been stable. Her past medical history is significant for hyperlipidemia.  Back Pain This is a chronic problem. The current episode started more than 1 year ago. The problem occurs intermittently. The problem has been waxing and waning since onset. The pain is present in the lumbar spine. The quality of the pain is described as aching.  The pain is at a severity of 7/10. The pain is moderate.  Arthritis Presents for follow-up visit. She complains of pain and stiffness. The symptoms have been stable. Affected locations include the left knee, right knee, right MCP and left MCP. Her pain is at a severity of 7/10. Pertinent negatives include no fatigue.  Depression        This is a chronic problem.  The current episode started more than 1 year ago.   The onset quality is gradual.   The problem occurs intermittently.  Associated symptoms include hopelessness, irritable, restlessness, decreased interest and sad.  Associated symptoms include no fatigue and no helplessness.  Past treatments include SNRIs - Serotonin and norepinephrine reuptake inhibitors.  Past medical history includes thyroid problem and anxiety.   Anxiety Presents for follow-up visit. Symptoms include excessive worry, irritability, nervous/anxious behavior and restlessness. Patient reports no shortness of breath.    Hyperlipidemia This is a chronic problem. The current episode started more than 1 year ago. She has no history of obesity. Pertinent negatives include no shortness of breath. Current antihyperlipidemic treatment includes statins. The current treatment provides moderate improvement of lipids. Risk factors for coronary artery disease include dyslipidemia, hypertension, a sedentary lifestyle and post-menopausal.     Review of Systems  Constitutional:  Positive for irritability. Negative for fatigue and malaise/fatigue.  HENT:  Negative for hoarse voice.   Eyes:  Negative for blurred vision.  Respiratory:  Negative for shortness of breath.   Gastrointestinal:  Negative for constipation.  Musculoskeletal:  Positive for arthritis, back pain and stiffness.  Psychiatric/Behavioral:  Positive for depression. The patient is nervous/anxious.   All other systems reviewed and are negative.     Objective:   Physical Exam Vitals reviewed.  Constitutional:       General: She  is irritable. She is not in acute distress.    Appearance: She is well-developed. She is obese.  HENT:     Head: Normocephalic and atraumatic.     Right Ear: Tympanic membrane normal.     Left Ear: Tympanic membrane normal.  Eyes:     Pupils: Pupils are equal, round, and reactive to light.  Neck:     Thyroid: No thyromegaly.  Cardiovascular:     Rate and Rhythm: Normal rate and regular rhythm.     Heart sounds: Normal heart sounds. No murmur heard. Pulmonary:     Effort: Pulmonary effort is normal. No respiratory distress.     Breath sounds: Normal breath sounds. No wheezing.  Abdominal:     General: Bowel sounds are normal. There is no distension.     Palpations: Abdomen is soft.     Tenderness: There is no abdominal tenderness.  Musculoskeletal:        General: No tenderness. Normal range of motion.     Cervical back: Normal range of motion and neck supple.  Skin:    General: Skin is warm and dry.  Neurological:     Mental Status: She is alert and oriented to person, place, and time.     Cranial Nerves: No cranial nerve deficit.     Deep Tendon Reflexes: Reflexes are normal and symmetric.  Psychiatric:        Behavior: Behavior normal.        Thought Content: Thought content normal.        Judgment: Judgment normal.         BP 129/80   Pulse 76   Temp 98.1 F (36.7 C) (Temporal)   Ht '5\' 7"'  (1.702 m)   Wt (!) 301 lb 9.6 oz (136.8 kg)   BMI 47.24 kg/m   Assessment & Plan:  JONATHAN KIRKENDOLL comes in today with chief complaint of Hypothyroidism (Follow up )   Diagnosis and orders addressed:  1. Chronic pain of both knees - meloxicam (MOBIC) 7.5 MG tablet; Take 1 tablet (7.5 mg total) by mouth daily.  Dispense: 90 tablet; Refill: 3 - CMP14+EGFR  2. Fibromyalgia - meloxicam (MOBIC) 7.5 MG tablet; Take 1 tablet (7.5 mg total) by mouth daily.  Dispense: 90 tablet; Refill: 3 - CMP14+EGFR  3. Essential hypertension - hydrochlorothiazide (HYDRODIURIL)  25 MG tablet; TAKE ONE (1) TABLET EACH DAY  Dispense: 90 tablet; Refill: 1 - benazepril (LOTENSIN) 40 MG tablet; TAKE ONE (1) TABLET EACH DAY  Dispense: 90 tablet; Refill: 1 - amLODipine (NORVASC) 10 MG tablet; TAKE ONE (1) TABLET EACH DAY  Dispense: 90 tablet; Refill: 1 - CMP14+EGFR  4. Type 2 diabetes mellitus with other specified complication, with long-term current use of insulin (HCC) - Bayer DCA Hb A1c Waived - CMP14+EGFR  5. Hypothyroidism, unspecified type - CMP14+EGFR - TSH  6. Chronic bilateral low back pain with bilateral sciatica - CMP14+EGFR  7. Generalized OA - CMP14+EGFR  8. Recurrent major depressive disorder, in partial remission (HCC) - CMP14+EGFR  9. Morbid obesity ([place]) - CMP14+EGFR   Labs pending Health Maintenance reviewed Diet and exercise encouraged  Follow up plan: 3 months    Evelina Dun, FNP

## 2021-07-23 ENCOUNTER — Ambulatory Visit: Payer: Medicare Other | Admitting: Family

## 2021-07-23 ENCOUNTER — Other Ambulatory Visit: Payer: Self-pay | Admitting: Family

## 2021-07-23 LAB — CMP14+EGFR
ALT: 45 IU/L — ABNORMAL HIGH (ref 0–32)
AST: 43 IU/L — ABNORMAL HIGH (ref 0–40)
Albumin/Globulin Ratio: 1.5 (ref 1.2–2.2)
Albumin: 4.4 g/dL (ref 3.8–4.8)
Alkaline Phosphatase: 58 IU/L (ref 44–121)
BUN/Creatinine Ratio: 23 (ref 12–28)
BUN: 15 mg/dL (ref 8–27)
Bilirubin Total: 0.4 mg/dL (ref 0.0–1.2)
CO2: 24 mmol/L (ref 20–29)
Calcium: 9.7 mg/dL (ref 8.7–10.3)
Chloride: 97 mmol/L (ref 96–106)
Creatinine, Ser: 0.64 mg/dL (ref 0.57–1.00)
Globulin, Total: 2.9 g/dL (ref 1.5–4.5)
Glucose: 223 mg/dL — ABNORMAL HIGH (ref 65–99)
Potassium: 4.7 mmol/L (ref 3.5–5.2)
Sodium: 137 mmol/L (ref 134–144)
Total Protein: 7.3 g/dL (ref 6.0–8.5)
eGFR: 99 mL/min/1.73

## 2021-07-23 LAB — TSH: TSH: 1.59 u[IU]/mL (ref 0.450–4.500)

## 2021-07-23 MED ORDER — DAPAGLIFLOZIN PROPANEDIOL 5 MG PO TABS: 5.0000 mg | ORAL_TABLET | Freq: Every day | ORAL | 1 refills | Status: DC

## 2021-07-23 NOTE — Progress Notes (Signed)
Pt returning call about labs  

## 2021-07-28 ENCOUNTER — Other Ambulatory Visit: Payer: Self-pay | Admitting: Family

## 2021-08-17 ENCOUNTER — Telehealth: Payer: Medicare Other

## 2021-09-21 ENCOUNTER — Other Ambulatory Visit: Payer: Self-pay | Admitting: Family

## 2021-09-21 DIAGNOSIS — E039 Hypothyroidism, unspecified: Secondary | ICD-10-CM

## 2021-09-23 ENCOUNTER — Telehealth: Payer: Self-pay | Admitting: Family

## 2021-09-23 NOTE — Telephone Encounter (Signed)
Left message for patient to call back and schedule Medicare Annual Wellness Visit (AWV) either virtually or phone I left my number for patient to call [phone].  *due 12/15/2020 awvi per [facility]  please schedule at anytime with health coach  This should be a 45 minute visit.

## 2021-09-29 ENCOUNTER — Ambulatory Visit (INDEPENDENT_AMBULATORY_CARE_PROVIDER_SITE_OTHER): Payer: Medicare Other

## 2021-09-29 VITALS — Ht 67.0 in | Wt 300.0 lb

## 2021-09-29 DIAGNOSIS — Z0000 Encounter for general adult medical examination without abnormal findings: Secondary | ICD-10-CM | POA: Diagnosis not present

## 2021-09-29 NOTE — Progress Notes (Signed)
Subjective:   Cynthia Espinoza is a 65 y.o. female who presents for an Initial Medicare Annual Wellness Visit.  Virtual Visit via Telephone Note  I connected with  Cynthia Espinoza on 09/29/21 at  4:15 PM EST by telephone and verified that I am speaking with the correct person using two identifiers.  Location: Patient: Home Provider: [facility] Persons participating in the virtual visit: patient/Nurse Health Advisor   I discussed the limitations, risks, security and privacy concerns of performing an evaluation and management service by telephone and the availability of in person appointments. The patient expressed understanding and agreed to proceed.  Interactive audio and video telecommunications were attempted between this nurse and patient, however failed, due to patient having technical difficulties OR patient did not have access to video capability.  We continued and completed visit with audio only.  Some vital signs may be absent or patient reported.   Nandi Tonnesen E Viet Kemmerer, LPN   Review of Systems     Cardiac Risk Factors include: advanced age (>90mn, >>75women);diabetes mellitus;sedentary lifestyle;obesity (BMI >30kg/m2);dyslipidemia     Objective:    Today's Vitals   09/29/21 1619 09/29/21 1620  Weight: 300 lb (136.1 kg)   Height: _0  (1.702 m)   PainSc:  4    Body mass index is 46.99 kg/m.  Advanced Directives 09/29/2021 04/12/2019 04/02/2019 04/02/2019  Does Patient Have a Medical Advance Directive? No No No No  Would patient like information on creating a medical advance directive? No - Patient declined No - Patient declined No - Patient declined -    Current Medications (verified) Outpatient Encounter Medications as of 09/29/2021  Medication Sig   amLODipine (NORVASC) 10 MG tablet TAKE ONE (1) TABLET EACH DAY   benazepril (LOTENSIN) 40 MG tablet TAKE ONE (1) TABLET EACH DAY   blood glucose meter kit and supplies Dispense based on patient and insurance preference. Use up  to four times daily as directed. (FOR ICD-10 E10.9, E11.9).   busPIRone (BUSPAR) 5 MG tablet Take 1 tablet (5 mg total) by mouth 3 (three) times daily as needed.   DULoxetine (CYMBALTA) 30 MG capsule Take 1 capsule (30 mg total) by mouth daily.   hydrochlorothiazide (HYDRODIURIL) 25 MG tablet TAKE ONE (1) TABLET EACH DAY   insulin NPH-regular Human (70-30) 100 UNIT/ML injection Inject 13-15 Units into the skin daily with breakfast.   Insulin Pen Needle 29G X 5MM MISC Use with Insulin twice daily Dx E11.69   levothyroxine (SYNTHROID) 200 MCG tablet TAKE 1 TABLET BY MOUTH DAILY BEFORE BREAKFAST   levothyroxine (SYNTHROID) 25 MCG tablet TAKE 1 TABLET BY MOUTH EVERY MORNING BEFORE BREAKFAST   meloxicam (MOBIC) 7.5 MG tablet Take 1 tablet (7.5 mg total) by mouth daily.   TRUEplus Lancets 30G MISC Check BS up to QID Dx E11.9   dapagliflozin propanediol (FARXIGA) 5 MG TABS tablet Take 1 tablet (5 mg total) by mouth daily before breakfast. (Patient not taking: Reported on 09/29/2021)   insulin aspart (FIASP FLEXTOUCH) 100 UNIT/ML FlexTouch Pen Inject 15-30 Units into the skin in the morning and at bedtime. (Patient not taking: Reported on 09/29/2021)   rosuvastatin (CRESTOR) 5 MG tablet Take 1 tablet (5 mg total) by mouth daily. (Patient not taking: Reported on 09/29/2021)   No facility-administered encounter medications on file as of 09/29/2021.    Allergies (verified) Metformin and related, Asa [aspirin], and Penicillins   History: Past Medical History:  Diagnosis Date   Anxiety    Depression  Hypertension    Thyroid disease    Past Surgical History:  Procedure Laterality Date   CESAREAN SECTION     Family History  Problem Relation Age of Onset   Hypertension Mother    Thyroid disease Mother    Osteoporosis Mother    Social History   Socioeconomic History   Marital status: Single    Spouse name: Not on file   Number of children: 1   Years of education: Not on file   Highest  education level: Not on file  Occupational History   Occupation: disability since 2019  Tobacco Use   Smoking status: Never   Smokeless tobacco: Never  Vaping Use   Vaping Use: Never used  Substance and Sexual Activity   Alcohol use: No   Drug use: No   Sexual activity: Not on file  Other Topics Concern   Not on file  Social History Narrative   Son lives next door   Social Determinants of Health   Financial Resource Strain: Low Risk    Difficulty of Paying Living Expenses: Not hard at all  Food Insecurity: No Food Insecurity   Worried About Charity fundraiser in the Last Year: Never true   [place] in the Last Year: Never true  Transportation Needs: No Transportation Needs   Lack of Transportation (Medical): No   Lack of Transportation (Non-Medical): No  Physical Activity: Insufficiently Active   Days of Exercise per Week: 7 days   Minutes of Exercise per Session: 20 min  Stress: No Stress Concern Present   Feeling of Stress : Only a little  Social Connections: Moderately Integrated   Frequency of Communication with Friends and Family: More than three times a week   Frequency of Social Gatherings with Friends and Family: More than three times a week   Attends Religious Services: More than 4 times per year   Active Member of [employer] or Organizations: Yes   Attends Archivist Meetings: 1 to 4 times per year   Marital Status: Divorced    Tobacco Counseling Counseling given: Not Answered   Clinical Intake:  Pre-visit preparation completed: Yes  Pain : 0-10 Pain Score: 4  Pain Type: Chronic pain Pain Location: Generalized Pain Orientation: Right, Left Pain Descriptors / Indicators: Aching, Burning, Sore, Discomfort Pain Onset: More than a month ago Pain Frequency: Intermittent Effect of Pain on Daily Activities: worse in the mornings - stiffness, aching in joints, muscles     BMI - recorded: 46.99 Nutritional Status: BMI > 30   Obese Nutritional Risks: None Diabetes: Yes CBG done?: No Did pt. bring in CBG monitor from home?: No  How often do you need to have someone help you when you read instructions, pamphlets, or other written materials from your doctor or pharmacy?: 1 - Never  Diabetic? Yes Nutrition Risk Assessment:  Has the patient had any N/V/D within the last 2 months?  No  Does the patient have any non-healing wounds?  No  Has the patient had any unintentional weight loss or weight gain?  No   Diabetes:  Is the patient diabetic?  Yes  If diabetic, was a CBG obtained today?  No  Did the patient bring in their glucometer from home?  No  How often do you monitor your CBG's? BID.   Financial Strains and Diabetes Management:  Are you having any financial strains with the device, your supplies or your medication? No .  Does the patient want to  be seen by Chronic Care Management for management of their diabetes?  No  Would the patient like to be referred to a Nutritionist or for Diabetic Management?  No   Diabetic Exams:  Diabetic Eye Exam: Overdue for diabetic eye exam. Pt has been advised about the importance in completing this exam. She declines referral and plans to make appt with [facility] soon.  Diabetic Foot Exam: Completed 04/01/2021. Pt has been advised about the importance in completing this exam. Pt is scheduled for diabetic foot exam on next year.    Interpreter Needed?: No  Information entered by :: Keliah Harned, LPN   Activities of Daily Living In your present state of health, do you have any difficulty performing the following activities: 09/29/2021  Hearing? N  Vision? N  Difficulty concentrating or making decisions? N  Walking or climbing stairs? Y  Dressing or bathing? N  Doing errands, shopping? N  Preparing Food and eating ? N  Using the Toilet? N  In the past six months, have you accidently leaked urine? N  Do you have problems with loss of bowel control? N  Managing  your Medications? N  Managing your Finances? N  Housekeeping or managing your Housekeeping? N  Some recent data might be hidden    Patient Care Team: [name], FNP as PCP - General (Family Medicine) Shea Nofsinger, Norva Riffle, LCSW as Social Worker (Licensed Clinical Social Worker)  Indicate any recent [employer] you may have received from other than [facility] providers in the past year (date may be approximate).     Assessment:   This is a routine wellness examination for Cynthia Espinoza.  Hearing/Vision screen Hearing Screening - Comments:: Denies hearing difficulties  Vision Screening - Comments:: Behind on eye exams - no eye doctor - plans to go soon.  Dietary issues and exercise activities discussed: Current Exercise Habits: Home exercise routine, Type of exercise: walking, Time (Minutes): 20, Frequency (Times/Week): 7, Weekly Exercise (Minutes/Week): 140, Intensity: Mild, Exercise limited by: neurologic condition(s)   Goals Addressed             This Visit's Progress    Manage My Emotions; Manage anxiety and stress issues faced   On track    Timeframe:  Short-Term Goal Priority:  Medium Progress: On Track Start Date:      05/28/21                       Expected End Date:    08/17/21                  Follow Up Date  07/07/21  Manage My Emotions (Patient) Manage Anxiety of Stress issues faced  Why This is important?   When you are stressed, down or upset, your body reacts too.  For example, your blood pressure may get higher; you may have a headache or stomachache.  When your emotions get the best of you, your body's ability to fight off cold and flu gets weak.  These steps will help you manage your emotions.     Patient Self Care Activities:  Self administers medications as prescribed Attends all scheduled provider appointments Performs ADL's independently  Patient Coping Strengths:  Family Friends Spirituality Hopefulness  Patient Self Care Deficits:   Challenges in managing anxiety or stress issues faced  Patient Goals:  - avoid negative self-talk - spend time or talk with others every day - practice relaxation or meditation daily - talk about feelings with  a friend, family or spiritual advisor - keep a calendar with appointment dates  Follow Up Plan: LCSW to call client on 07/07/21        Depression Screen PHQ 2/9 Scores 09/29/2021 05/28/2021 05/28/2021 04/01/2021 03/08/2021 02/04/2020 10/15/2019  PHQ - 2 Score _0 0 2 0 0  PHQ- 9 Score - _1 -    Fall Risk Fall Risk  09/29/2021 04/01/2021 02/04/2020 10/15/2019 06/06/2019  Falls in the past year? 0 0 1 1 0  Number falls in past yr: 0 - 0 0 -  Injury with Fall? 0 - 0 0 -  Risk for fall due to : Orthopedic patient;Impaired balance/gait - - - -  Follow up Falls prevention discussed - - - -    FALL RISK PREVENTION PERTAINING TO THE HOME:  Any stairs in or around the home? No  If so, are there any without handrails? No  Home free of loose throw rugs in walkways, pet beds, electrical cords, etc? Yes  Adequate lighting in your home to reduce risk of falls? Yes   ASSISTIVE DEVICES UTILIZED TO PREVENT FALLS:  Life alert? No  Use of a cane, walker or w/c? Yes  Grab bars in the bathroom? Yes  Shower chair or bench in shower? Yes  Elevated toilet seat or a handicapped toilet? Yes   TIMED UP AND GO:  Was the test performed? No . Telephonic visit.  Cognitive Function:     6CIT Screen 09/29/2021  What Year? 0 points  What month? 0 points  What time? 0 points  Count back from 20 0 points  Months in reverse 0 points  Repeat phrase 0 points  Total Score 0    Immunizations Immunization History  Administered Date(s) Administered   PFIZER Comirnaty(Gray Top)Covid-19 Tri-Sucrose Vaccine 07/25/2020, 08/18/2020    TDAP status: Due, Education has been provided regarding the importance of this vaccine. Advised may receive this vaccine at local pharmacy or Health Dept.  Aware to provide a copy of the vaccination record if obtained from local pharmacy or Health Dept. Verbalized acceptance and understanding.  Flu Vaccine status: Declined, Education has been provided regarding the importance of this vaccine but patient still declined. Advised may receive this vaccine at local pharmacy or Health Dept. Aware to provide a copy of the vaccination record if obtained from local pharmacy or Health Dept. Verbalized acceptance and understanding.  Pneumococcal vaccine status: Declined,  Education has been provided regarding the importance of this vaccine but patient still declined. Advised may receive this vaccine at local pharmacy or Health Dept. Aware to provide a copy of the vaccination record if obtained from local pharmacy or Health Dept. Verbalized acceptance and understanding.   Covid-19 vaccine status: Information provided on how to obtain vaccines.   Qualifies for Shingles Vaccine? Yes   Zostavax completed No   Shingrix Completed?: No.    Education has been provided regarding the importance of this vaccine. Patient has been advised to call insurance company to determine out of pocket expense if they have not yet received this vaccine. Advised may also receive vaccine at local pharmacy or Health Dept. Verbalized acceptance and understanding.  Screening Tests Health Maintenance  Topic Date Due   OPHTHALMOLOGY EXAM  Never done   PAP SMEAR-Modifier  Never done   COVID-19 Vaccine (3 - Pfizer risk series) 09/15/2020   Zoster Vaccines- Shingrix (1 of 2) 10/21/2021 (Originally 07/01/1975)   Pneumococcal Vaccine 21-11 Years old (1 - PCV) 07/22/2022 (  Originally 07/01/1962)   MAMMOGRAM  07/22/2022 (Originally 07/01/2008)   TETANUS/TDAP  07/22/2022 (Originally 07/01/1975)   Fecal DNA (Cologuard)  07/22/2022 (Originally 07/01/2008)   HEMOGLOBIN A1C  01/19/2022   FOOT EXAM  04/01/2022   Hepatitis C Screening  Completed   HIV Screening  Completed   HPV VACCINES  Aged Out    INFLUENZA VACCINE  Discontinued    Health Maintenance  Health Maintenance Due  Topic Date Due   OPHTHALMOLOGY EXAM  Never done   PAP SMEAR-Modifier  Never done   COVID-19 Vaccine (3 - Pfizer risk series) 09/15/2020    Colorectal cancer screening: Declines  Mammogram: Declines  Bone Density Scan: Declines  Lung Cancer Screening: (Low Dose CT Chest recommended if Age 67-80 years, 30 pack-year currently smoking OR have quit w/in 15years.) does not qualify.  Additional Screening:  Hepatitis C Screening: does qualify; Completed 04/01/2021  Vision Screening: Recommended annual ophthalmology exams for early detection of glaucoma and other disorders of the eye. Is the patient up to date with their annual eye exam?  No  Who is the provider or what is the name of the office in which the patient attends annual eye exams? none If pt is not established with a provider, would they like to be referred to a provider to establish care? No .   Dental Screening: Recommended annual dental exams for proper oral hygiene  Community Resource Referral / Chronic Care Management: CRR required this visit?  No   CCM required this visit?  No      Plan:     I have personally reviewed and noted the following in the patient's chart:   Medical and social history Use of alcohol, tobacco or illicit drugs  Current medications and supplements including opioid prescriptions. Patient is not currently taking opioid prescriptions. Functional ability and status Nutritional status Physical activity Advanced directives List of other physicians Hospitalizations, surgeries, and ER visits in previous 12 months Vitals Screenings to include cognitive, depression, and falls Referrals and appointments  In addition, I have reviewed and discussed with patient certain preventive protocols, quality metrics, and best practice recommendations. A written personalized care plan for preventive services as well as general  preventive health recommendations were provided to patient.     Sandrea Hammond, LPN   [id number]   Nurse Notes: Declines vaccines and screenings: we discussed the importance and she says she will consider getting some scheduled.

## 2021-09-29 NOTE — Patient Instructions (Signed)
Cynthia Espinoza , Thank you for taking time to come for your Medicare Wellness Visit. I appreciate your ongoing commitment to your health goals. Please review the following plan we discussed and let me know if I can assist you in the future.   Screening recommendations/referrals: Colonoscopy: Due Mammogram: Due Bone Density: Due at age 63 Recommended yearly ophthalmology/optometry visit for glaucoma screening and checkup Recommended yearly dental visit for hygiene and checkup  Vaccinations: Influenza vaccine: Due Pneumococcal vaccine: Due Tdap vaccine: Due Shingles vaccine: Due  Covid-19: Done 07/25/2020 & 08/18/2020  Advanced directives: Advance directive discussed with you today. Even though you declined this today, please call our office should you change your mind, and we can give you the proper paperwork for you to fill out.   Conditions/risks identified: Aim for 30 minutes of exercise or brisk walking each day, drink 6-8 glasses of water and eat lots of fruits and vegetables.   Next appointment: Follow up in one year for your annual wellness visit.  Preventive Care 40-64 Years, Female Preventive care refers to lifestyle choices and visits with your health care provider that can promote health and wellness. What does preventive care include? A yearly physical exam. This is also called an annual well check. Dental exams once or twice a year. Routine eye exams. Ask your health care provider how often you should have your eyes checked. Personal lifestyle choices, including: Daily care of your teeth and gums. Regular physical activity. Eating a healthy diet. Avoiding tobacco and drug use. Limiting alcohol use. Practicing safe sex. Taking low-dose aspirin daily starting at age 46. Taking vitamin and mineral supplements as recommended by your health care provider. What happens during an annual well check? The services and screenings done by your health care provider during your annual  well check will depend on your age, overall health, lifestyle risk factors, and family history of disease. Counseling  Your health care provider may ask you questions about your: Alcohol use. Tobacco use. Drug use. Emotional well-being. Home and relationship well-being. Sexual activity. Eating habits. Work and work Statistician. Method of birth control. Menstrual cycle. Pregnancy history. Screening  You may have the following tests or measurements: Height, weight, and BMI. Blood pressure. Lipid and cholesterol levels. These may be checked every 5 years, or more frequently if you are over 73 years old. Skin check. Lung cancer screening. You may have this screening every year starting at age 27 if you have a 30-pack-year history of smoking and currently smoke or have quit within the past 15 years. Fecal occult blood test (FOBT) of the stool. You may have this test every year starting at age 7. Flexible sigmoidoscopy or colonoscopy. You may have a sigmoidoscopy every 5 years or a colonoscopy every 10 years starting at age 48. Hepatitis C blood test. Hepatitis B blood test. Sexually transmitted disease (STD) testing. Diabetes screening. This is done by checking your blood sugar (glucose) after you have not eaten for a while (fasting). You may have this done every 1-3 years. Mammogram. This may be done every 1-2 years. Talk to your health care provider about when you should start having regular mammograms. This may depend on whether you have a family history of breast cancer. BRCA-related cancer screening. This may be done if you have a family history of breast, ovarian, tubal, or peritoneal cancers. Pelvic exam and Pap test. This may be done every 3 years starting at age 63. Starting at age 52, this may be done every 5 years  if you have a Pap test in combination with an HPV test. Bone density scan. This is done to screen for osteoporosis. You may have this scan if you are at high risk for  osteoporosis. Discuss your test results, treatment options, and if necessary, the need for more tests with your health care provider. Vaccines  Your health care provider may recommend certain vaccines, such as: Influenza vaccine. This is recommended every year. Tetanus, diphtheria, and acellular pertussis (Tdap, Td) vaccine. You may need a Td booster every 10 years. Zoster vaccine. You may need this after age 65. Pneumococcal 13-valent conjugate (PCV13) vaccine. You may need this if you have certain conditions and were not previously vaccinated. Pneumococcal polysaccharide (PPSV23) vaccine. You may need one or two doses if you smoke cigarettes or if you have certain conditions. Talk to your health care provider about which screenings and vaccines you need and how often you need them. This information is not intended to replace advice given to you by your health care provider. Make sure you discuss any questions you have with your health care provider. Document Released: 11/27/2015 Document Revised: 07/20/2016 Document Reviewed: 09/01/2015 Elsevier Interactive Patient Education  2017 [place] Prevention in the Home Falls can cause injuries. They can happen to people of all ages. There are many things you can do to make your home safe and to help prevent falls. What can I do on the outside of my home? Regularly fix the edges of walkways and driveways and fix any cracks. Remove anything that might make you trip as you walk through a door, such as a raised step or threshold. Trim any bushes or trees on the path to your home. Use bright outdoor lighting. Clear any walking paths of anything that might make someone trip, such as rocks or tools. Regularly check to see if handrails are loose or broken. Make sure that both sides of any steps have handrails. Any raised decks and porches should have guardrails on the edges. Have any leaves, snow, or ice cleared regularly. Use sand or  salt on walking paths during winter. Clean up any spills in your garage right away. This includes oil or grease spills. What can I do in the bathroom? Use night lights. Install grab bars by the toilet and in the tub and shower. Do not use towel bars as grab bars. Use non-skid mats or decals in the tub or shower. If you need to sit down in the shower, use a plastic, non-slip stool. Keep the floor dry. Clean up any water that spills on the floor as soon as it happens. Remove soap buildup in the tub or shower regularly. Attach bath mats securely with double-sided non-slip rug tape. Do not have throw rugs and other things on the floor that can make you trip. What can I do in the bedroom? Use night lights. Make sure that you have a light by your bed that is easy to reach. Do not use any sheets or blankets that are too big for your bed. They should not hang down onto the floor. Have a firm chair that has side arms. You can use this for support while you get dressed. Do not have throw rugs and other things on the floor that can make you trip. What can I do in the kitchen? Clean up any spills right away. Avoid walking on wet floors. Keep items that you use a lot in easy-to-reach places. If you need to reach something  above you, use a strong step stool that has a grab bar. Keep electrical cords out of the way. Do not use floor polish or wax that makes floors slippery. If you must use wax, use non-skid floor wax. Do not have throw rugs and other things on the floor that can make you trip. What can I do with my stairs? Do not leave any items on the stairs. Make sure that there are handrails on both sides of the stairs and use them. Fix handrails that are broken or loose. Make sure that handrails are as long as the stairways. Check any carpeting to make sure that it is firmly attached to the stairs. Fix any carpet that is loose or worn. Avoid having throw rugs at the top or bottom of the stairs. If  you do have throw rugs, attach them to the floor with carpet tape. Make sure that you have a light switch at the top of the stairs and the bottom of the stairs. If you do not have them, ask someone to add them for you. What else can I do to help prevent falls? Wear shoes that: Do not have high heels. Have rubber bottoms. Are comfortable and fit you well. Are closed at the toe. Do not wear sandals. If you use a stepladder: Make sure that it is fully opened. Do not climb a closed stepladder. Make sure that both sides of the stepladder are locked into place. Ask someone to hold it for you, if possible. Clearly mark and make sure that you can see: Any grab bars or handrails. First and last steps. Where the edge of each step is. Use tools that help you move around (mobility aids) if they are needed. These include: Canes. Walkers. Scooters. Crutches. Turn on the lights when you go into a dark area. Replace any light bulbs as soon as they burn out. Set up your furniture so you have a clear path. Avoid moving your furniture around. If any of your floors are uneven, fix them. If there are any pets around you, be aware of where they are. Review your medicines with your doctor. Some medicines can make you feel dizzy. This can increase your chance of falling. Ask your doctor what other things that you can do to help prevent falls. This information is not intended to replace advice given to you by your health care provider. Make sure you discuss any questions you have with your health care provider. Document Released: 08/27/2009 Document Revised: 04/07/2016 Document Reviewed: 12/05/2014 Elsevier Interactive Patient Education  2017 [employer].

## 2021-10-06 ENCOUNTER — Ambulatory Visit (INDEPENDENT_AMBULATORY_CARE_PROVIDER_SITE_OTHER): Payer: Medicare Other | Admitting: Licensed Clinical Social Worker

## 2021-10-06 DIAGNOSIS — Z794 Long term (current) use of insulin: Secondary | ICD-10-CM

## 2021-10-06 DIAGNOSIS — Z8659 Personal history of other mental and behavioral disorders: Secondary | ICD-10-CM

## 2021-10-06 DIAGNOSIS — E039 Hypothyroidism, unspecified: Secondary | ICD-10-CM

## 2021-10-06 DIAGNOSIS — I10 Essential (primary) hypertension: Secondary | ICD-10-CM

## 2021-10-06 DIAGNOSIS — E1169 Type 2 diabetes mellitus with other specified complication: Secondary | ICD-10-CM

## 2021-10-06 DIAGNOSIS — F3341 Major depressive disorder, recurrent, in partial remission: Secondary | ICD-10-CM

## 2021-10-06 NOTE — Patient Instructions (Addendum)
Visit Information  Patient Goals:  Manage My Emotions (Patient).  Manage Anxiety and  Stress issues faced.  Timeframe:  Short-Term Goal Priority:  Medium Progress: On Track Start Date:      10/06/21                       Expected End Date:    01/04/22                  Follow Up Date  12/07/21 at 3:00 PM  Manage My Emotions (Patient) Manage Anxiety and Stress issues faced  Why This is important?   When you are stressed, down or upset, your body reacts too.  For example, your blood pressure may get higher; you may have a headache or stomachache.  When your emotions get the best of you, your body's ability to fight off cold and flu gets weak.  These steps will help you manage your emotions.     Patient Self Care Activities:  Self administers medications as prescribed Attends all scheduled provider appointments Performs ADL's independently  Patient Coping Strengths:  Family Friends Spirituality Hopefulness  Patient Self Care Deficits:  Challenges in managing anxiety or stress issues faced  Patient Goals:  - avoid negative self-talk - spend time or talk with others every day - practice relaxation or meditation daily - talk about feelings with a friend, family or spiritual advisor - keep a calendar with appointment dates  Follow Up Plan: LCSW to call client or son of client on 12/07/21 at 3:00 PM to assess needs of client  Kelton Pillar.Huck Ashworth MSW, LCSW Licensed Clinical Social Worker [facility] Care Management [phone]

## 2021-10-06 NOTE — Chronic Care Management (AMB) (Signed)
Chronic Care Management    Clinical Social Work Note  10/06/2021 Name: Cynthia Espinoza MRN: [id number] DOB: [birth year]  Cynthia Espinoza is a 65 y.o. year old female who is a primary care patient of [name], FNP. The CCM team was consulted to assist the patient with chronic disease management and/or care coordination needs related to: [name] .   Engaged with patient / son of patient, Cynthia Espinoza, by telephone for follow up visit in response to provider referral for social work chronic care management and care coordination services.   Consent to Services:  The patient was given information about Chronic Care Management services, agreed to services, and gave verbal consent prior to initiation of services.  Please see initial visit note for detailed documentation.   Patient agreed to services and consent obtained.   Assessment: Review of patient past medical history, allergies, medications, and health status, including review of relevant consultants reports was performed today as part of a comprehensive evaluation and provision of chronic care management and care coordination services.     SDOH (Social Determinants of Health) assessments and interventions performed:  SDOH Interventions    Flowsheet Row Most Recent Value  SDOH Interventions   Physical Activity Interventions Other (Comments)  [walking challenges. she uses a cane or a walker to help her walk]  Stress Interventions Provide Counseling  [client has stress related to managing medical needs]  Depression Interventions/Treatment  Currently on Treatment        Advanced Directives Status: See [employer] application for related entries.  CCM Care Plan  Allergies  Allergen Reactions   Metformin And Related     GI distress    Asa [Aspirin] Rash   Penicillins Rash    Did it involve swelling of the face/tongue/throat, SOB, or low BP? Yes Did it involve sudden or severe rash/hives, skin peeling, or any reaction  on the inside of your mouth or nose? Yes Did you need to seek medical attention at a hospital or doctor's office? Yes When did it last happen? Over 10 years ago If all above answers are "NO", may proceed with cephalosporin use.     Outpatient Encounter Medications as of 10/06/2021  Medication Sig   amLODipine (NORVASC) 10 MG tablet TAKE ONE (1) TABLET EACH DAY   benazepril (LOTENSIN) 40 MG tablet TAKE ONE (1) TABLET EACH DAY   blood glucose meter kit and supplies Dispense based on patient and insurance preference. Use up to four times daily as directed. (FOR ICD-10 E10.9, E11.9).   busPIRone (BUSPAR) 5 MG tablet Take 1 tablet (5 mg total) by mouth 3 (three) times daily as needed.   dapagliflozin propanediol (FARXIGA) 5 MG TABS tablet Take 1 tablet (5 mg total) by mouth daily before breakfast. (Patient not taking: Reported on 09/29/2021)   DULoxetine (CYMBALTA) 30 MG capsule Take 1 capsule (30 mg total) by mouth daily.   hydrochlorothiazide (HYDRODIURIL) 25 MG tablet TAKE ONE (1) TABLET EACH DAY   insulin aspart (FIASP FLEXTOUCH) 100 UNIT/ML FlexTouch Pen Inject 15-30 Units into the skin in the morning and at bedtime. (Patient not taking: Reported on 09/29/2021)   insulin NPH-regular Human (70-30) 100 UNIT/ML injection Inject 13-15 Units into the skin daily with breakfast.   Insulin Pen Needle 29G X 5MM MISC Use with Insulin twice daily Dx E11.69   levothyroxine (SYNTHROID) 200 MCG tablet TAKE 1 TABLET BY MOUTH DAILY BEFORE BREAKFAST   levothyroxine (SYNTHROID) 25 MCG tablet TAKE 1 TABLET BY MOUTH EVERY  MORNING BEFORE BREAKFAST   meloxicam (MOBIC) 7.5 MG tablet Take 1 tablet (7.5 mg total) by mouth daily.   rosuvastatin (CRESTOR) 5 MG tablet Take 1 tablet (5 mg total) by mouth daily. (Patient not taking: Reported on 09/29/2021)   TRUEplus Lancets 30G MISC Check BS up to QID Dx E11.9   No facility-administered encounter medications on file as of 10/06/2021.    Patient Active Problem List    Diagnosis Date Noted   Type 2 diabetes mellitus with other specified complication ([place]) 70/35/0093   DKA (diabetic ketoacidoses) 04/02/2019   History of posttraumatic stress disorder (PTSD) 10/05/2018   Palpitations 05/12/2017   Premature beat 05/12/2017   Moderate episode of recurrent major depressive disorder ([place]) 02/16/2017   Recurrent major depressive disorder, in partial remission ([place]) 01/24/2017   Diabetes mellitus ([place]) 01/10/2017   Essential hypertension 08/30/2016   Hypothyroidism 08/30/2016   Generalized OA 08/30/2016   Fibromyalgia 08/30/2016   Chronic bilateral low back pain with bilateral sciatica 08/30/2016   Chronic pain of both knees 08/30/2016   Pain in both wrists 08/30/2016   Pain in joint involving ankle and foot 08/30/2016   Pain of both shoulder joints 08/30/2016   Morbid obesity ([place]) 08/30/2016    Conditions to be addressed/monitored: monitor client management of anxiety issues. Monitor client management of stress issues faced.   Care Plan : LCSW Care Plan  Updates made by Katha Cabal, LCSW since 10/06/2021 12:00 AM     Problem: Emotional Distress      Goal: Emotional Health Supportedl ; Manage anxiety and stress issues   Start Date: 10/06/2021  Expected End Date: 01/04/2022  This Visit's Progress: On track  Recent Progress: On track  Priority: Medium  Note:   Current Barriers:  Chronic Mental Health needs related to anxiety and stress management Mobility issues Pain issues Suicidal Ideation/Homicidal Ideation: No  Clinical Social Work Goal(s):  patient will work with SW monthly by telephone or in person to reduce or manage symptoms related to anxiety and stress issues faced Patient will communicate with RNCM as needed in next 30 days to discuss nursing needs of client Patient will practice self care activities of choice for next 30 days  Interventions: 1:1 collaboration with [name], FNP regarding development and update of  comprehensive plan of care as evidenced by provider attestation and co-signature Discussed client needs with Cynthia Espinoza, son of client Discussed with Cynthia Espinoza walking challenges of client. He said client has some knee pain issues when walking. He said she uses either a cane or a walker to help her walk.  Reviewed family support for client with Cynthia Craze II).  Cynthia Espinoza, son of client, is supportive. Daughter in law of client is supportive. Reviewed client appetite and sleeping issues. Discussed transport needs of client. Cynthia Espinoza said that family members transport client to and from her medical appointments Encouraged client or Cynthia Espinoza to call RNCM as needed in next 30 days for CCM nursing support for client. Discussed mood status of client. Cynthia Espinoza said he thought that mood of client was stable. Reviewed relaxation techniques of client. Client likes to take walks outdoors. She lives spending time with her family members.  She likes talking on the phone with friends Discussed client use of hand brace. Cynthia Espinoza said client uses a brace for her right hand occasionally  Patient Self Care Activities:  Self administers medications as prescribed Attends all scheduled provider appointments Performs ADL's independently  Patient Coping Strengths:  Family Friends Spirituality  Hopefulness  Patient Self Care Deficits:  Challenges in managing anxiety or stress issues faced  Patient Goals:  - avoid negative self-talk - spend time or talk with others every day - practice relaxation or meditation daily - talk about feelings with a friend, family or spiritual advisor - keep a calendar with appointment dates  Follow Up Plan: LCSW to call client or son of client on 12/07/21 at 3:00 PM to assess client needs.      Cynthia Espinoza MSW, LCSW Licensed Clinical Social Worker [facility] Care Management [phone]

## 2021-10-28 ENCOUNTER — Ambulatory Visit: Payer: Medicare Other | Admitting: Family

## 2021-10-29 ENCOUNTER — Telehealth: Payer: Self-pay | Admitting: Family

## 2021-10-29 NOTE — Telephone Encounter (Signed)
Patient aware Lendon Colonel not in today.

## 2021-10-29 NOTE — Telephone Encounter (Signed)
Patient called requesting that Neysa Bonito write her a letter stating that she is disabled and can't do jury duty.  Please advise and call patient.

## 2021-11-01 NOTE — Telephone Encounter (Signed)
A note sent to her MyChart.

## 2021-11-04 ENCOUNTER — Ambulatory Visit (INDEPENDENT_AMBULATORY_CARE_PROVIDER_SITE_OTHER): Payer: Medicare Other | Admitting: Family

## 2021-11-04 ENCOUNTER — Encounter: Payer: Self-pay | Admitting: Family

## 2021-11-04 DIAGNOSIS — J019 Acute sinusitis, unspecified: Secondary | ICD-10-CM

## 2021-11-04 MED ORDER — DOXYCYCLINE HYCLATE 100 MG PO TABS: 100.0000 mg | ORAL_TABLET | Freq: Two times a day (BID) | ORAL | 0 refills | Status: DC

## 2021-11-04 NOTE — Progress Notes (Signed)
Virtual Visit  Note Due to COVID-19 pandemic this visit was conducted virtually. This visit type was conducted due to national recommendations for restrictions regarding the COVID-19 Pandemic (e.g. social distancing, sheltering in place) in an effort to limit this patient's exposure and mitigate transmission in our community. All issues noted in this document were discussed and addressed.  A physical exam was not performed with this format.  I connected with Cynthia Espinoza on 11/04/21 at 12:30 pm  by telephone and verified that I am speaking with the correct person using two identifiers. Cynthia Espinoza is currently located at home  and no one is currently with her during visit. The provider, Jannifer Rodney, FNP is located in their office at time of visit.  I discussed the limitations, risks, security and privacy concerns of performing an evaluation and management service by telephone and the availability of in person appointments. I also discussed with the patient that there may be a patient responsible charge related to this service. The patient expressed understanding and agreed to proceed.  Cynthia Espinoza are scheduled for a virtual visit with your provider today.    Just as we do with appointments in the office, we must obtain your consent to participate.  Your consent will be active for this visit and any virtual visit you may have with one of our providers in the next 365 days.    If you have a MyChart account, I can also send a copy of this consent to you electronically.  All virtual visits are billed to your insurance company just like a traditional visit in the office.  As this is a virtual visit, video technology does not allow for your provider to perform a traditional examination.  This may limit your provider's ability to fully assess your condition.  If your provider identifies any concerns that need to be evaluated in person or the need to arrange testing such as labs, EKG, etc, we will  make arrangements to do so.    Although advances in technology are sophisticated, we cannot ensure that it will always work on either your end or our end.  If the connection with a video visit is poor, we may have to switch to a telephone visit.  With either a video or telephone visit, we are not always able to ensure that we have a secure connection.   I need to obtain your verbal consent now.   Are you willing to proceed with your visit today?   Cynthia Espinoza has provided verbal consent on 11/04/2021 for a virtual visit (video or telephone).   Jannifer Rodney, [place] 11/04/2021  12:33 PM    History and Present Illness:  Sinusitis This is a new problem. The current episode started in the past 7 days. The problem has been gradually worsening since onset. There has been no fever. Her pain is at a severity of 7/10. Associated symptoms include chills, congestion, coughing, ear pain, headaches, sinus pressure, sneezing and a sore throat. Pertinent negatives include no hoarse voice. (Fatigue ) Past treatments include oral decongestants. The treatment provided mild relief.     Review of Systems  Constitutional:  Positive for chills.  HENT:  Positive for congestion, ear pain, sinus pressure, sneezing and sore throat. Negative for hoarse voice.   Respiratory:  Positive for cough.   Neurological:  Positive for headaches.    Observations/Objective: No SOB or distress noted   Assessment and Plan: 1. Acute sinusitis, recurrence not specified, unspecified location  Pt will wait to start antibiotics for a few days. She will take OTC, If no improvement or worsen start doxycycline.  - Take meds as prescribed - Use a cool mist humidifier  -Use saline nose sprays frequently -Force fluids -For any cough or congestion  Use plain Mucinex- regular strength or max strength is fine -For fever or aces or pains- take tylenol or ibuprofen. -Throat lozenges if help - doxycycline (VIBRA-TABS) 100 MG tablet;  Take 1 tablet (100 mg total) by mouth 2 (two) times daily.  Dispense: 20 tablet; Refill: 0    I discussed the assessment and treatment plan with the patient. The patient was provided an opportunity to ask questions and all were answered. The patient agreed with the plan and demonstrated an understanding of the instructions.   The patient was advised to call back or seek an in-person evaluation if the symptoms worsen or if the condition fails to improve as anticipated.  The above assessment and management plan was discussed with the patient. The patient verbalized understanding of and has agreed to the management plan. Patient is aware to call the clinic if symptoms persist or worsen. Patient is aware when to return to the clinic for a follow-up visit. Patient educated on when it is appropriate to go to the emergency department.   Time call ended:  12:41 pm   I provided 11 minutes of  non face-to-face time during this encounter.    Jannifer Rodney, FNP

## 2021-11-04 NOTE — Patient Instructions (Signed)

## 2021-11-16 ENCOUNTER — Other Ambulatory Visit: Payer: Self-pay | Admitting: Family

## 2021-12-07 ENCOUNTER — Ambulatory Visit (INDEPENDENT_AMBULATORY_CARE_PROVIDER_SITE_OTHER): Payer: Medicare Other | Admitting: Licensed Clinical Social Worker

## 2021-12-07 DIAGNOSIS — Z8659 Personal history of other mental and behavioral disorders: Secondary | ICD-10-CM

## 2021-12-07 DIAGNOSIS — E1169 Type 2 diabetes mellitus with other specified complication: Secondary | ICD-10-CM

## 2021-12-07 DIAGNOSIS — E039 Hypothyroidism, unspecified: Secondary | ICD-10-CM

## 2021-12-07 DIAGNOSIS — F3341 Major depressive disorder, recurrent, in partial remission: Secondary | ICD-10-CM

## 2021-12-07 DIAGNOSIS — I10 Essential (primary) hypertension: Secondary | ICD-10-CM

## 2021-12-07 NOTE — Chronic Care Management (AMB) (Signed)
Chronic Care Management    Clinical Social Work Note  12/07/2021 Name: Cynthia Espinoza MRN: [id number] DOB: [birth year]  Cynthia Espinoza is a 66 y.o. year old female who is a primary care patient of Cynthia Balloon, FNP. The CCM team was consulted to assist the patient with chronic disease management and/or care coordination needs related to: [name] .   Engaged with patient by telephone for follow up visit in response to provider referral for social work chronic care management and care coordination services.   Consent to Services:  The patient was given information about Chronic Care Management services, agreed to services, and gave verbal consent prior to initiation of services.  Please see initial visit note for detailed documentation.   Patient agreed to services and consent obtained.   Assessment: Review of patient past medical history, allergies, medications, and health status, including review of relevant consultants reports was performed today as part of a comprehensive evaluation and provision of chronic care management and care coordination services.     SDOH (Social Determinants of Health) assessments and interventions performed:  SDOH Interventions    Flowsheet Row Most Recent Value  SDOH Interventions   Physical Activity Interventions Other (Comments)  [client has walking challenges. She uses a walker to help her walk. she gets short of breath occasionally when walking.]  Stress Interventions Provide Counseling  [client has stress related to managing medical needs. client has stress related to managing depression issues]  Depression Interventions/Treatment  Counseling, Currently on Treatment        Advanced Directives Status: See Vynca application for related entries.  CCM Care Plan  Allergies  Allergen Reactions   Metformin And Related     GI distress    Asa [Aspirin] Rash   Penicillins Rash    Did it involve swelling of the face/tongue/throat, SOB, or  low BP? Yes Did it involve sudden or severe rash/hives, skin peeling, or any reaction on the inside of your mouth or nose? Yes Did you need to seek medical attention at a hospital or doctor's office? Yes When did it last happen? Over 10 years ago If all above answers are NO, may proceed with cephalosporin use.     Outpatient Encounter Medications as of 12/07/2021  Medication Sig   amLODipine (NORVASC) 10 MG tablet TAKE ONE (1) TABLET EACH DAY   benazepril (LOTENSIN) 40 MG tablet TAKE ONE (1) TABLET EACH DAY   blood glucose meter kit and supplies Dispense based on patient and insurance preference. Use up to four times daily as directed. (FOR ICD-10 E10.9, E11.9).   busPIRone (BUSPAR) 5 MG tablet Take 1 tablet (5 mg total) by mouth 3 (three) times daily as needed.   dapagliflozin propanediol (FARXIGA) 5 MG TABS tablet Take 1 tablet (5 mg total) by mouth daily before breakfast. (Patient not taking: Reported on 09/29/2021)   doxycycline (VIBRA-TABS) 100 MG tablet Take 1 tablet (100 mg total) by mouth 2 (two) times daily.   DULoxetine (CYMBALTA) 30 MG capsule Take 1 capsule (30 mg total) by mouth daily.   glucose blood (GNP EASY TOUCH GLUCOSE TEST) test strip DX: E11.69   hydrochlorothiazide (HYDRODIURIL) 25 MG tablet TAKE ONE (1) TABLET EACH DAY   insulin aspart (FIASP FLEXTOUCH) 100 UNIT/ML FlexTouch Pen Inject 15-30 Units into the skin in the morning and at bedtime. (Patient not taking: Reported on 09/29/2021)   insulin NPH-regular Human (70-30) 100 UNIT/ML injection Inject 13-15 Units into the skin daily with breakfast.   Insulin  Pen Needle 29G X 5MM MISC Use with Insulin twice daily Dx E11.69   levothyroxine (SYNTHROID) 200 MCG tablet TAKE 1 TABLET BY MOUTH DAILY BEFORE BREAKFAST   levothyroxine (SYNTHROID) 25 MCG tablet TAKE 1 TABLET BY MOUTH EVERY MORNING BEFORE BREAKFAST   meloxicam (MOBIC) 7.5 MG tablet Take 1 tablet (7.5 mg total) by mouth daily.   rosuvastatin (CRESTOR) 5 MG tablet  Take 1 tablet (5 mg total) by mouth daily. (Patient not taking: Reported on 09/29/2021)   TRUEplus Lancets 30G MISC Check BS up to QID Dx E11.9   No facility-administered encounter medications on file as of 12/07/2021.    Patient Active Problem List   Diagnosis Date Noted   Type 2 diabetes mellitus with other specified complication ([place]) 02/63/7858   DKA (diabetic ketoacidoses) 04/02/2019   History of posttraumatic stress disorder (PTSD) 10/05/2018   Palpitations 05/12/2017   Premature beat 05/12/2017   Moderate episode of recurrent major depressive disorder ([place]) 02/16/2017   Recurrent major depressive disorder, in partial remission ([place]) 01/24/2017   Diabetes mellitus ([place]) 01/10/2017   Essential hypertension 08/30/2016   Hypothyroidism 08/30/2016   Generalized OA 08/30/2016   Fibromyalgia 08/30/2016   Chronic bilateral low back pain with bilateral sciatica 08/30/2016   Chronic pain of both knees 08/30/2016   Pain in both wrists 08/30/2016   Pain in joint involving ankle and foot 08/30/2016   Pain of both shoulder joints 08/30/2016   Morbid obesity ([place]) 08/30/2016    Conditions to be addressed/monitored: monitor client management of anxiety and stress issues. Monitor client management of depression issues.   Care Plan : LCSW Care Plan  Updates made by Katha Cabal, LCSW since 12/07/2021 12:00 AM     Problem: Emotional Distress      Goal: Emotional Health Supportedl ; Manage anxiety and stress issues   Start Date: 12/07/2021  Expected End Date: 03/01/2022  This Visit's Progress: On track  Recent Progress: On track  Priority: Medium  Note:   Current Barriers:  Chronic Mental Health needs related to anxiety and stress management Mobility issues Pain issues Suicidal Ideation/Homicidal Ideation: No  Clinical Social Work Goal(s):  patient will work with SW monthly by telephone or in person to reduce or manage symptoms related to anxiety and stress issues  faced Patient will communicate with RNCM as needed in next 30 days to discuss nursing needs of client Patient will practice self care activities of choice for next 30 days  Interventions: 1:1 collaboration with Cynthia Balloon, FNP regarding development and update of comprehensive plan of care as evidenced by provider attestation and co-signature Discussed client needs with Cynthia Espinoza.  Discussed with Cynthia Espinoza the walking challenges of client. She has some pain issues when walking. But, she tries to walk occasionally for exercise. Cynthia Espinoza said she uses a walker to help her ambulate.   Reviewed family support for client. Client has support of her son, Cynthia Espinoza. Client has support of her daughter in law.  Discussed transport needs of client. Cynthia Espinoza said she drives herself on short trips in the area.  If she needs transport help, she gets help from her son or from her daughter in  law.  Encouraged client or Cynthia Espinoza to call RNCM as needed in next 30 days for CCM nursing support for client. Discussed mood status of client. Client said that she thought her mood was stable. She did not mention any mood issues at this time Reviewed relaxation techniques of client. Client likes to  take walks outdoors. She lives spending time with her family members.  She likes talking on the phone with friends Provided counseling support for client.  Patient Self Care Activities:  Self administers medications as prescribed Attends all scheduled provider appointments Performs ADL's independently  Patient Coping Strengths:  Family Friends Spirituality Hopefulness  Patient Self Care Deficits:  Challenges in managing anxiety or stress issues faced  Patient Goals:  - avoid negative self-talk - spend time or talk with others every day - practice relaxation or meditation daily - talk about feelings with a friend, family or spiritual advisor - keep a calendar with appointment dates  Follow Up Plan: LCSW to  call client or son of client on 02/01/22 at 2:00 PM to assess client needs.      Cynthia Espinoza.Cynthia Espinoza MSW, [place] representative [facility] Care Management [phone]

## 2021-12-07 NOTE — Patient Instructions (Addendum)
Visit Information  Patient Goals:  Manage My Emotions (Patient). Manage Anxiety and Stress issues faced  Timeframe:  Short-Term Goal Priority:  Medium Progress: On Track Start Date:     12/07/21                       Expected End Date:   03/01/22                  Follow Up Date  02/01/22 at 2:00 PM  Manage My Emotions (Patient) Manage Anxiety and Stress issues faced  Why This is important?   When you are stressed, down or upset, your body reacts too.  For example, your blood pressure may get higher; you may have a headache or stomachache.  When your emotions get the best of you, your body's ability to fight off cold and flu gets weak.  These steps will help you manage your emotions.     Patient Self Care Activities:  Self administers medications as prescribed Attends all scheduled provider appointments Performs ADL's independently  Patient Coping Strengths:  Family Friends Spirituality Hopefulness  Patient Self Care Deficits:  Challenges in managing anxiety or stress issues faced  Patient Goals:  - avoid negative self-talk - spend time or talk with others every day - practice relaxation or meditation daily - talk about feelings with a friend, family or spiritual advisor - keep a calendar with appointment dates  Follow Up Plan: LCSW to call client or son of client on 02/01/22 at 2:00 PM to assess needs of client    Kelton Pillar.Romy Mcgue MSW, LCSW Licensed Visual merchandiser [facility] Care Management [phone]

## 2021-12-09 ENCOUNTER — Other Ambulatory Visit: Payer: Self-pay | Admitting: Family

## 2021-12-24 ENCOUNTER — Other Ambulatory Visit: Payer: Self-pay | Admitting: Family

## 2021-12-24 DIAGNOSIS — E039 Hypothyroidism, unspecified: Secondary | ICD-10-CM

## 2022-02-01 ENCOUNTER — Telehealth: Payer: Medicare Other

## 2022-02-01 ENCOUNTER — Telehealth: Payer: Self-pay | Admitting: Licensed Clinical Social Worker

## 2022-02-01 NOTE — Telephone Encounter (Signed)
?  Chronic Care Management  ?  ? Clinical Social Work Note ?  ?02/01/22 ?Name: Cynthia Espinoza           MRN: [id number]       DOB: 1956/11/14 ?  ?Cynthia Espinoza is a 66 y.o. year old female who is a primary care patient of Junie Spencer, FNP. The CCM team was consulted to assist the patient with chronic disease management and/or care coordination needs related to: [employer] .  ?  ?Unsuccessful phone outreach to client. LCSW called client today but client did not answer phone.  LCSW did leave phone message for client asking her to call LCSW at [phone] ? ?Follow Up Plan:  LCSW to call client at 03/29/22 at 2:00 PM ? ?Kelton Pillar.Ahmeer Tuman MSW, LCSW ?Licensed Clinical Social Worker ?[place] Care Management ?[phone] ?

## 2022-02-15 ENCOUNTER — Other Ambulatory Visit: Payer: Self-pay | Admitting: Family

## 2022-03-28 ENCOUNTER — Other Ambulatory Visit: Payer: Self-pay | Admitting: Family

## 2022-03-28 DIAGNOSIS — I10 Essential (primary) hypertension: Secondary | ICD-10-CM

## 2022-03-29 ENCOUNTER — Telehealth: Payer: Medicare Other

## 2022-05-31 ENCOUNTER — Other Ambulatory Visit: Payer: Self-pay | Admitting: Family

## 2022-05-31 DIAGNOSIS — I10 Essential (primary) hypertension: Secondary | ICD-10-CM

## 2022-06-13 ENCOUNTER — Encounter: Payer: Self-pay | Admitting: Family

## 2022-06-13 ENCOUNTER — Ambulatory Visit (INDEPENDENT_AMBULATORY_CARE_PROVIDER_SITE_OTHER): Payer: Medicare Other | Admitting: Family

## 2022-06-13 VITALS — BP 127/67 | HR 69 | Temp 98.1°F | Ht 67.0 in | Wt 297.2 lb

## 2022-06-13 DIAGNOSIS — M797 Fibromyalgia: Secondary | ICD-10-CM | POA: Diagnosis not present

## 2022-06-13 DIAGNOSIS — M5441 Lumbago with sciatica, right side: Secondary | ICD-10-CM

## 2022-06-13 DIAGNOSIS — G8929 Other chronic pain: Secondary | ICD-10-CM

## 2022-06-13 DIAGNOSIS — I10 Essential (primary) hypertension: Secondary | ICD-10-CM

## 2022-06-13 DIAGNOSIS — Z794 Long term (current) use of insulin: Secondary | ICD-10-CM

## 2022-06-13 DIAGNOSIS — M25561 Pain in right knee: Secondary | ICD-10-CM | POA: Diagnosis not present

## 2022-06-13 DIAGNOSIS — E785 Hyperlipidemia, unspecified: Secondary | ICD-10-CM

## 2022-06-13 DIAGNOSIS — M25562 Pain in left knee: Secondary | ICD-10-CM

## 2022-06-13 DIAGNOSIS — F3341 Major depressive disorder, recurrent, in partial remission: Secondary | ICD-10-CM

## 2022-06-13 DIAGNOSIS — E1169 Type 2 diabetes mellitus with other specified complication: Secondary | ICD-10-CM

## 2022-06-13 DIAGNOSIS — M5442 Lumbago with sciatica, left side: Secondary | ICD-10-CM

## 2022-06-13 DIAGNOSIS — E039 Hypothyroidism, unspecified: Secondary | ICD-10-CM

## 2022-06-13 DIAGNOSIS — M159 Polyosteoarthritis, unspecified: Secondary | ICD-10-CM

## 2022-06-13 LAB — BAYER DCA HB A1C WAIVED: HB A1C (BAYER DCA - WAIVED): 11.8 % — ABNORMAL HIGH (ref 4.8–5.6)

## 2022-06-13 MED ORDER — LEVOTHYROXINE SODIUM 25 MCG PO TABS
25.0000 ug | ORAL_TABLET | Freq: Every day | ORAL | 1 refills | Status: DC
Start: 2022-06-13 — End: 2022-12-15

## 2022-06-13 MED ORDER — DULOXETINE HCL 30 MG PO CPEP: 30.0000 mg | ORAL_CAPSULE | Freq: Every day | ORAL | 3 refills | Status: DC

## 2022-06-13 MED ORDER — LEVOTHYROXINE SODIUM 200 MCG PO TABS: 200.0000 ug | ORAL_TABLET | Freq: Every day | ORAL | 1 refills | Status: DC

## 2022-06-13 MED ORDER — ROSUVASTATIN CALCIUM 5 MG PO TABS: 5.0000 mg | ORAL_TABLET | Freq: Every day | ORAL | 3 refills | Status: DC

## 2022-06-13 MED ORDER — BENAZEPRIL HCL 40 MG PO TABS: ORAL_TABLET | ORAL | 3 refills | Status: DC

## 2022-06-13 MED ORDER — AMLODIPINE BESYLATE 10 MG PO TABS: ORAL_TABLET | ORAL | 3 refills | Status: DC

## 2022-06-13 MED ORDER — HYDROCHLOROTHIAZIDE 25 MG PO TABS: ORAL_TABLET | ORAL | 3 refills | Status: DC

## 2022-06-13 MED ORDER — BUSPIRONE HCL 5 MG PO TABS: 5.0000 mg | ORAL_TABLET | Freq: Three times a day (TID) | ORAL | 1 refills | Status: DC | PRN

## 2022-06-13 MED ORDER — MELOXICAM 7.5 MG PO TABS: 7.5000 mg | ORAL_TABLET | Freq: Every day | ORAL | 3 refills | Status: DC

## 2022-06-13 NOTE — Progress Notes (Signed)
Subjective:    Patient ID: Cynthia Espinoza, female    DOB: 1956/11/06, 66 y.o.   MRN: [id number]  Chief Complaint  Patient presents with   Diabetes   Pt presents to the office today for chronic follow up. She is morbid obese with a BMI of 46.  Diabetes She presents for her follow-up diabetic visit. She has type 2 diabetes mellitus. Pertinent negatives for hypoglycemia include no nervousness/anxiousness. Associated symptoms include fatigue. Pertinent negatives for diabetes include no blurred vision and no foot paresthesias. Symptoms are stable. Pertinent negatives for diabetic complications include no peripheral neuropathy. Risk factors for coronary artery disease include dyslipidemia, diabetes mellitus, hypertension, post-menopausal and sedentary lifestyle. She is following a generally unhealthy diet. Her overall blood glucose range is 180-200 mg/dl.  Hypertension This is a chronic problem. The current episode started more than 1 year ago. The problem has been resolved since onset. The problem is controlled. Pertinent negatives include no blurred vision, malaise/fatigue, peripheral edema or shortness of breath. Risk factors for coronary artery disease include dyslipidemia, diabetes mellitus, obesity and sedentary lifestyle. The current treatment provides moderate improvement. Identifiable causes of hypertension include a thyroid problem.  Thyroid Problem Presents for follow-up visit. Symptoms include depressed mood and fatigue. Patient reports no anxiety. The symptoms have been stable. Her past medical history is significant for hyperlipidemia.  Back Pain This is a chronic problem. The current episode started more than 1 year ago. The problem occurs intermittently. The problem has been waxing and waning since onset. The pain is present in the thoracic spine and lumbar spine. The quality of the pain is described as aching. The pain is at a severity of 5/10. The pain is moderate. She has tried NSAIDs  for the symptoms. The treatment provided mild relief.  Arthritis Presents for follow-up visit. She complains of pain and stiffness. The symptoms have been stable. Affected locations include the left knee, right knee, left MCP and right MCP. Her pain is at a severity of 8/10. Associated symptoms include fatigue.  Depression        This is a chronic problem.  The current episode started more than 1 year ago.   The onset quality is gradual.   The problem occurs intermittently.  Associated symptoms include fatigue, helplessness, hopelessness and sad.  Past treatments include SNRIs - Serotonin and norepinephrine reuptake inhibitors.  Past medical history includes thyroid problem.   Hyperlipidemia This is a chronic problem. The current episode started more than 1 year ago. The problem is uncontrolled. Recent lipid tests were reviewed and are high. Pertinent negatives include no shortness of breath. She is currently on no antihyperlipidemic treatment. The current treatment provides mild improvement of lipids. Risk factors for coronary artery disease include dyslipidemia and hypertension.      Review of Systems  Constitutional:  Positive for fatigue. Negative for malaise/fatigue.  Eyes:  Negative for blurred vision.  Respiratory:  Negative for shortness of breath.   Musculoskeletal:  Positive for arthritis, back pain and stiffness.  Psychiatric/Behavioral:  Positive for depression. The patient is not nervous/anxious.   All other systems reviewed and are negative.      Objective:   Physical Exam Vitals reviewed.  Constitutional:      General: She is not in acute distress.    Appearance: She is well-developed. She is obese.  HENT:     Head: Normocephalic and atraumatic.     Right Ear: Tympanic membrane normal.     Left Ear: Tympanic membrane normal.  Eyes:     Pupils: Pupils are equal, round, and reactive to light.  Neck:     Thyroid: No thyromegaly.  Cardiovascular:     Rate and Rhythm:  Normal rate and regular rhythm.     Heart sounds: Normal heart sounds. No murmur heard. Pulmonary:     Effort: Pulmonary effort is normal. No respiratory distress.     Breath sounds: Normal breath sounds. No wheezing.  Abdominal:     General: Bowel sounds are normal. There is no distension.     Palpations: Abdomen is soft.     Tenderness: There is no abdominal tenderness.  Musculoskeletal:        General: No tenderness. Normal range of motion.     Cervical back: Normal range of motion and neck supple.  Skin:    General: Skin is warm and dry.  Neurological:     Mental Status: She is alert and oriented to person, place, and time.     Cranial Nerves: No cranial nerve deficit.     Deep Tendon Reflexes: Reflexes are normal and symmetric.  Psychiatric:        Behavior: Behavior normal.        Thought Content: Thought content normal.        Judgment: Judgment normal.       BP 127/67   Pulse 69   Temp 98.1 F (36.7 C) (Temporal)   Ht '5\' 7"'  (1.702 m)   Wt 297 lb 3.2 oz (134.8 kg)   BMI 46.55 kg/m      Assessment & Plan:  Cynthia Espinoza comes in today with chief complaint of Diabetes   Diagnosis and orders addressed:  1. Chronic pain of both knees - meloxicam (MOBIC) 7.5 MG tablet; Take 1 tablet (7.5 mg total) by mouth daily.  Dispense: 90 tablet; Refill: 3 - CMP14+EGFR - CBC with Differential/Platelet  2. Fibromyalgia - meloxicam (MOBIC) 7.5 MG tablet; Take 1 tablet (7.5 mg total) by mouth daily.  Dispense: 90 tablet; Refill: 3 - DULoxetine (CYMBALTA) 30 MG capsule; Take 1 capsule (30 mg total) by mouth daily.  Dispense: 90 capsule; Refill: 3 - CMP14+EGFR - CBC with Differential/Platelet  3. Essential hypertension - benazepril (LOTENSIN) 40 MG tablet; TAKE ONE (1) TABLET EACH DAY (NEEDS TO BE SEEN BEFORE NEXT REFILL)  Dispense: 90 tablet; Refill: 3 - hydrochlorothiazide (HYDRODIURIL) 25 MG tablet; TAKE ONE (1) TABLET EACH DAY (NEEDS TO BE SEEN BEFORE NEXT REFILL)   Dispense: 90 tablet; Refill: 3 - amLODipine (NORVASC) 10 MG tablet; TAKE ONE (1) TABLET EACH DAY (NEEDS TO BE SEEN BEFORE NEXT REFILL)  Dispense: 90 tablet; Refill: 3 - CMP14+EGFR - CBC with Differential/Platelet  4. Hypothyroidism, unspecified type - levothyroxine (SYNTHROID) 200 MCG tablet; Take 1 tablet (200 mcg total) by mouth daily before breakfast. with 25 mg (Total 225 mg)  Dispense: 90 tablet; Refill: 1 - CMP14+EGFR - CBC with Differential/Platelet - TSH  5. Type 2 diabetes mellitus with other specified complication, with long-term current use of insulin (HCC) - CMP14+EGFR - CBC with Differential/Platelet - Lipid panel - Microalbumin / creatinine urine ratio - Bayer DCA Hb A1c Waived  6. Chronic bilateral low back pain with bilateral sciatica - CMP14+EGFR - CBC with Differential/Platelet  7. Generalized OA - CMP14+EGFR - CBC with Differential/Platelet  8. Morbid obesity ([place]) - CMP14+EGFR - CBC with Differential/Platelet  9. Recurrent major depressive disorder, in partial remission (HCC) - CMP14+EGFR - CBC with Differential/Platelet  10. Hyperlipidemia associated with type 2 diabetes  mellitus ([place]) - rosuvastatin (CRESTOR) 5 MG tablet; Take 1 tablet (5 mg total) by mouth daily.  Dispense: 90 tablet; Refill: 3   Labs pending Health Maintenance reviewed- Refuses all Diet and exercise encouraged  Follow up plan: 3 months    Evelina Dun, FNP

## 2022-06-13 NOTE — Patient Instructions (Signed)

## 2022-06-14 ENCOUNTER — Other Ambulatory Visit: Payer: Self-pay | Admitting: Family

## 2022-06-14 DIAGNOSIS — E1169 Type 2 diabetes mellitus with other specified complication: Secondary | ICD-10-CM

## 2022-06-14 LAB — CMP14+EGFR
ALT: 51 IU/L — ABNORMAL HIGH (ref 0–32)
AST: 59 IU/L — ABNORMAL HIGH (ref 0–40)
Albumin/Globulin Ratio: 1.5 (ref 1.2–2.2)
Albumin: 4.5 g/dL (ref 3.9–4.9)
Alkaline Phosphatase: 65 IU/L (ref 44–121)
BUN/Creatinine Ratio: 19 (ref 12–28)
BUN: 13 mg/dL (ref 8–27)
Bilirubin Total: 0.5 mg/dL (ref 0.0–1.2)
CO2: 21 mmol/L (ref 20–29)
Calcium: 9.7 mg/dL (ref 8.7–10.3)
Chloride: 97 mmol/L (ref 96–106)
Creatinine, Ser: 0.7 mg/dL (ref 0.57–1.00)
Globulin, Total: 3 g/dL (ref 1.5–4.5)
Glucose: 225 mg/dL — ABNORMAL HIGH (ref 70–99)
Potassium: 4 mmol/L (ref 3.5–5.2)
Sodium: 137 mmol/L (ref 134–144)
Total Protein: 7.5 g/dL (ref 6.0–8.5)
eGFR: 97 mL/min/{1.73_m2} (ref >59)

## 2022-06-14 LAB — LIPID PANEL
Chol/HDL Ratio: 4.4 ratio (ref 0.0–4.4)
Cholesterol, Total: 204 mg/dL — ABNORMAL HIGH (ref 100–199)
HDL: 46 mg/dL (ref >39)
LDL Chol Calc (NIH): 110 mg/dL — ABNORMAL HIGH (ref 0–99)
Triglycerides: 279 mg/dL — ABNORMAL HIGH (ref 0–149)
VLDL Cholesterol Cal: 48 mg/dL — ABNORMAL HIGH (ref 5–40)

## 2022-06-14 LAB — CBC WITH DIFFERENTIAL/PLATELET
Basophils Absolute: 0 10*3/uL (ref 0.0–0.2)
Basos: 0 %
EOS (ABSOLUTE): 0.2 10*3/uL (ref 0.0–0.4)
Eos: 2 %
Hematocrit: 42.4 % (ref 34.0–46.6)
Hemoglobin: 14.8 g/dL (ref 11.1–15.9)
Immature Grans (Abs): 0 10*3/uL (ref 0.0–0.1)
Immature Granulocytes: 0 %
Lymphocytes Absolute: 2.7 10*3/uL (ref 0.7–3.1)
Lymphs: 30 %
MCH: 32.2 pg (ref 26.6–33.0)
MCHC: 34.9 g/dL (ref 31.5–35.7)
MCV: 92 fL (ref 79–97)
Monocytes Absolute: 0.7 10*3/uL (ref 0.1–0.9)
Monocytes: 8 %
Neutrophils Absolute: 5.4 10*3/uL (ref 1.4–7.0)
Neutrophils: 60 %
Platelets: 235 10*3/uL (ref 150–450)
RBC: 4.59 x10E6/uL (ref 3.77–5.28)
RDW: 12.4 % (ref 11.7–15.4)
WBC: 9 10*3/uL (ref 3.4–10.8)

## 2022-06-14 LAB — MICROALBUMIN / CREATININE URINE RATIO
Creatinine, Urine: 133.7 mg/dL
Microalb/Creat Ratio: 7 mg/g creat (ref 0–29)
Microalbumin, Urine: 9.2 ug/mL

## 2022-06-14 LAB — TSH: TSH: 3.05 u[IU]/mL (ref 0.450–4.500)

## 2022-06-14 MED ORDER — SEMAGLUTIDE(0.25 OR 0.5MG/DOS) 2 MG/3ML ~~LOC~~ SOPN: 0.2500 mg | PEN_INJECTOR | SUBCUTANEOUS | 0 refills | Status: DC

## 2022-06-14 MED ORDER — ROSUVASTATIN CALCIUM 5 MG PO TABS: 5.0000 mg | ORAL_TABLET | Freq: Every day | ORAL | 3 refills | Status: DC

## 2022-06-14 MED ORDER — SEMAGLUTIDE(0.25 OR 0.5MG/DOS) 2 MG/3ML ~~LOC~~ SOPN: 0.5000 mg | PEN_INJECTOR | SUBCUTANEOUS | 0 refills | Status: DC

## 2022-06-16 ENCOUNTER — Telehealth: Payer: Self-pay | Admitting: Family

## 2022-06-16 NOTE — Telephone Encounter (Signed)
lmtcb

## 2022-06-16 NOTE — Telephone Encounter (Signed)
She should continue her insulin. This is an add on from her other medications. She should continue insulin as she was taking.

## 2022-06-16 NOTE — Telephone Encounter (Signed)
Pt called wanting to know if she is supposed to just take the Ozempic or can she keep taking the other insulin she is already on too? Also, pt wants to know if she is supposed to keep taking the insulin that's shes been on, is she supposed to take 1 or 2 doses daily? Please advise and call pt.

## 2022-06-29 NOTE — Telephone Encounter (Signed)
No return call 

## 2022-07-29 DIAGNOSIS — Z0289 Encounter for other administrative examinations: Secondary | ICD-10-CM

## 2022-08-12 ENCOUNTER — Other Ambulatory Visit: Payer: Self-pay | Admitting: Family

## 2022-09-10 IMAGING — DX DG ANKLE COMPLETE 3+V*R*
3 series · 3 of 3 positions shown · non-contrast
Comparison: None.

CLINICAL DATA: Pain

EXAM:
RIGHT ANKLE - COMPLETE 3+ VIEW

[ankle ap]
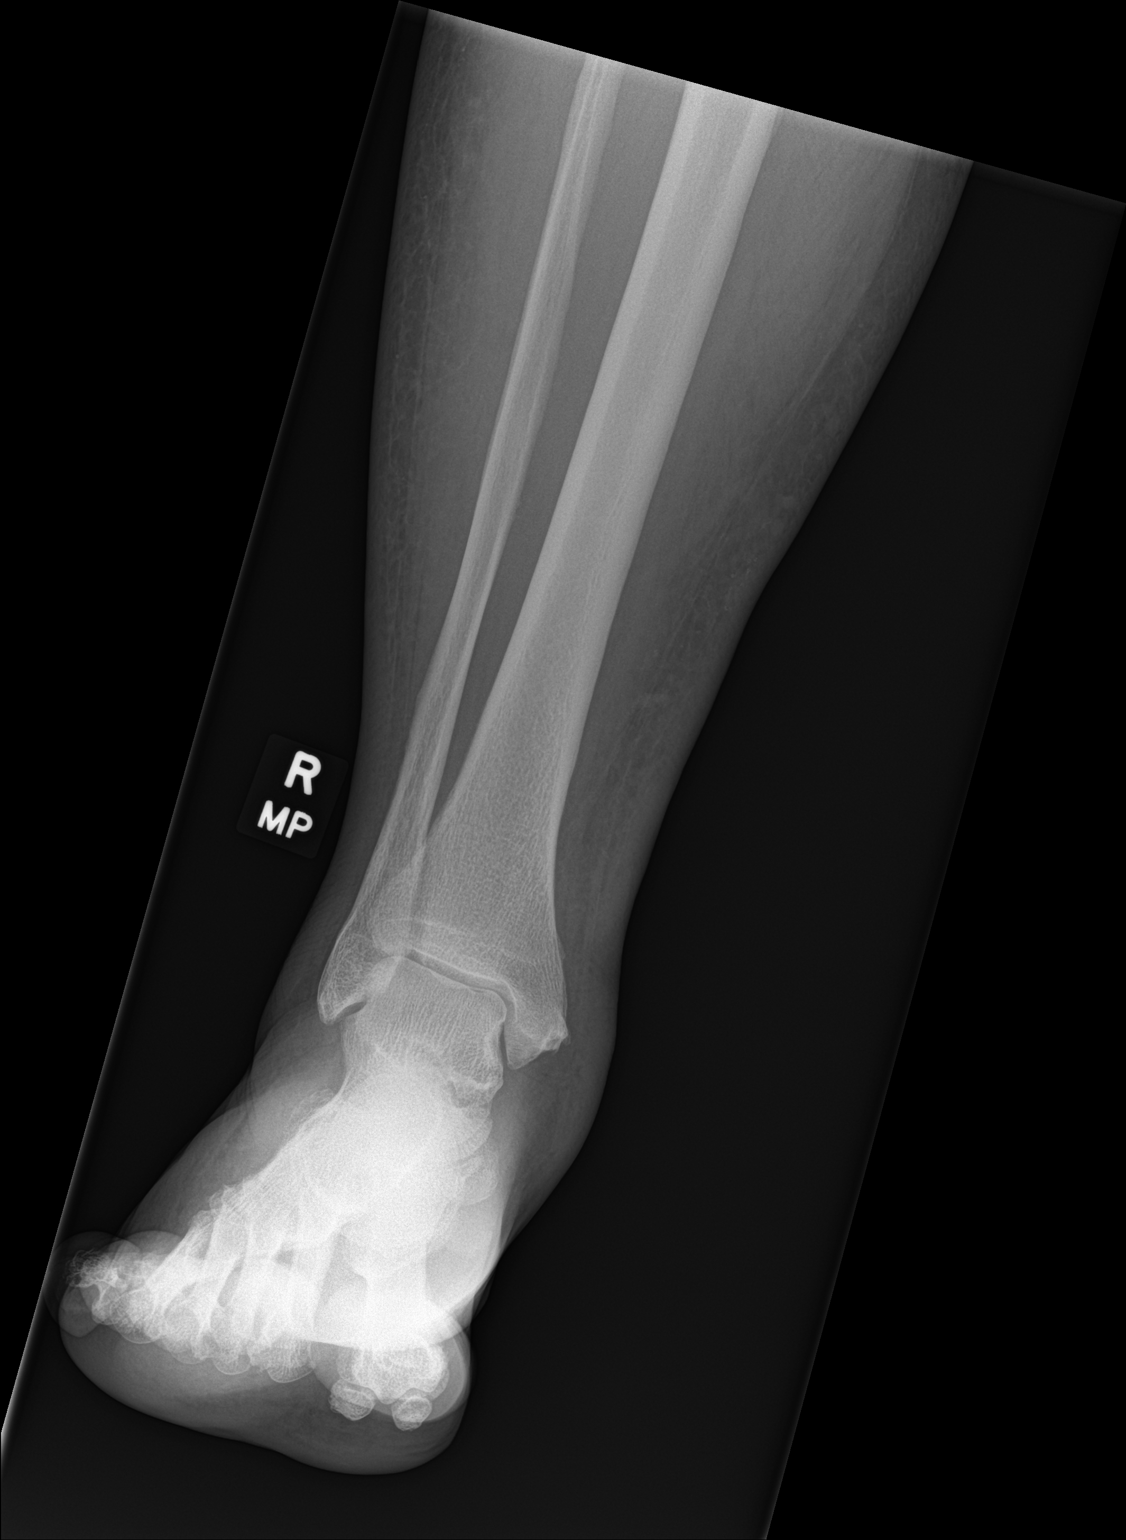

[ankle obl]
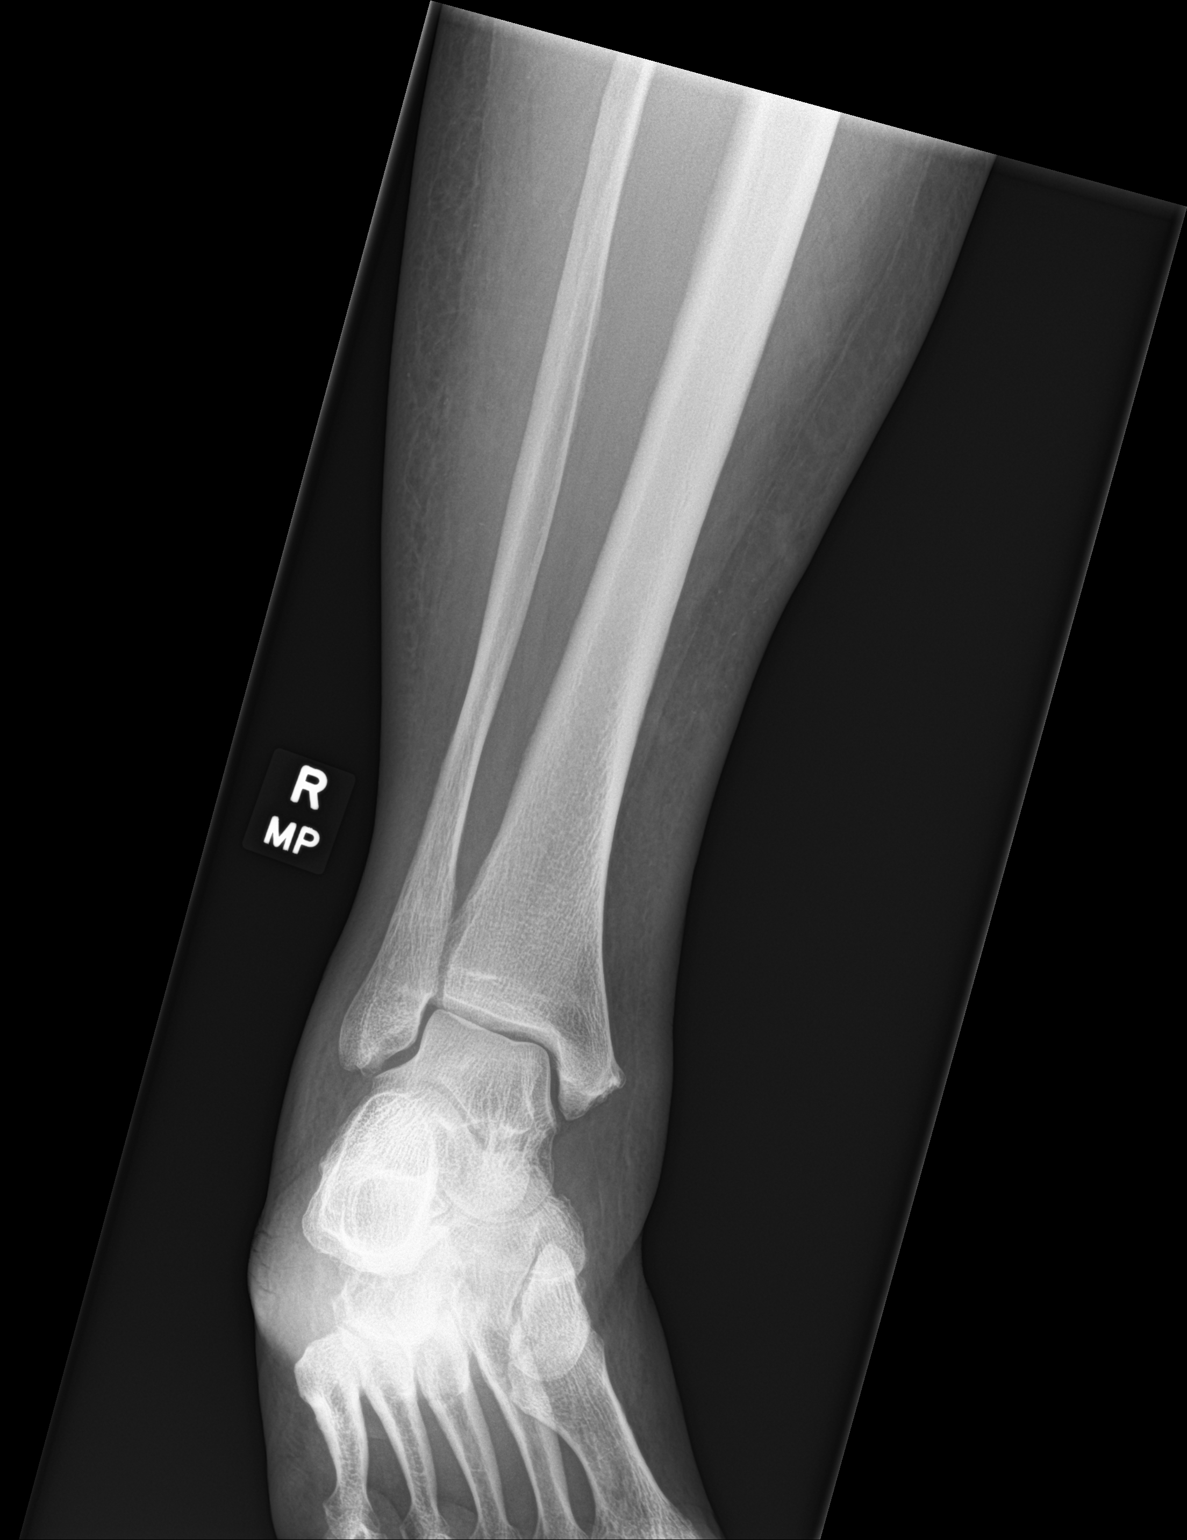

[ankle lat]
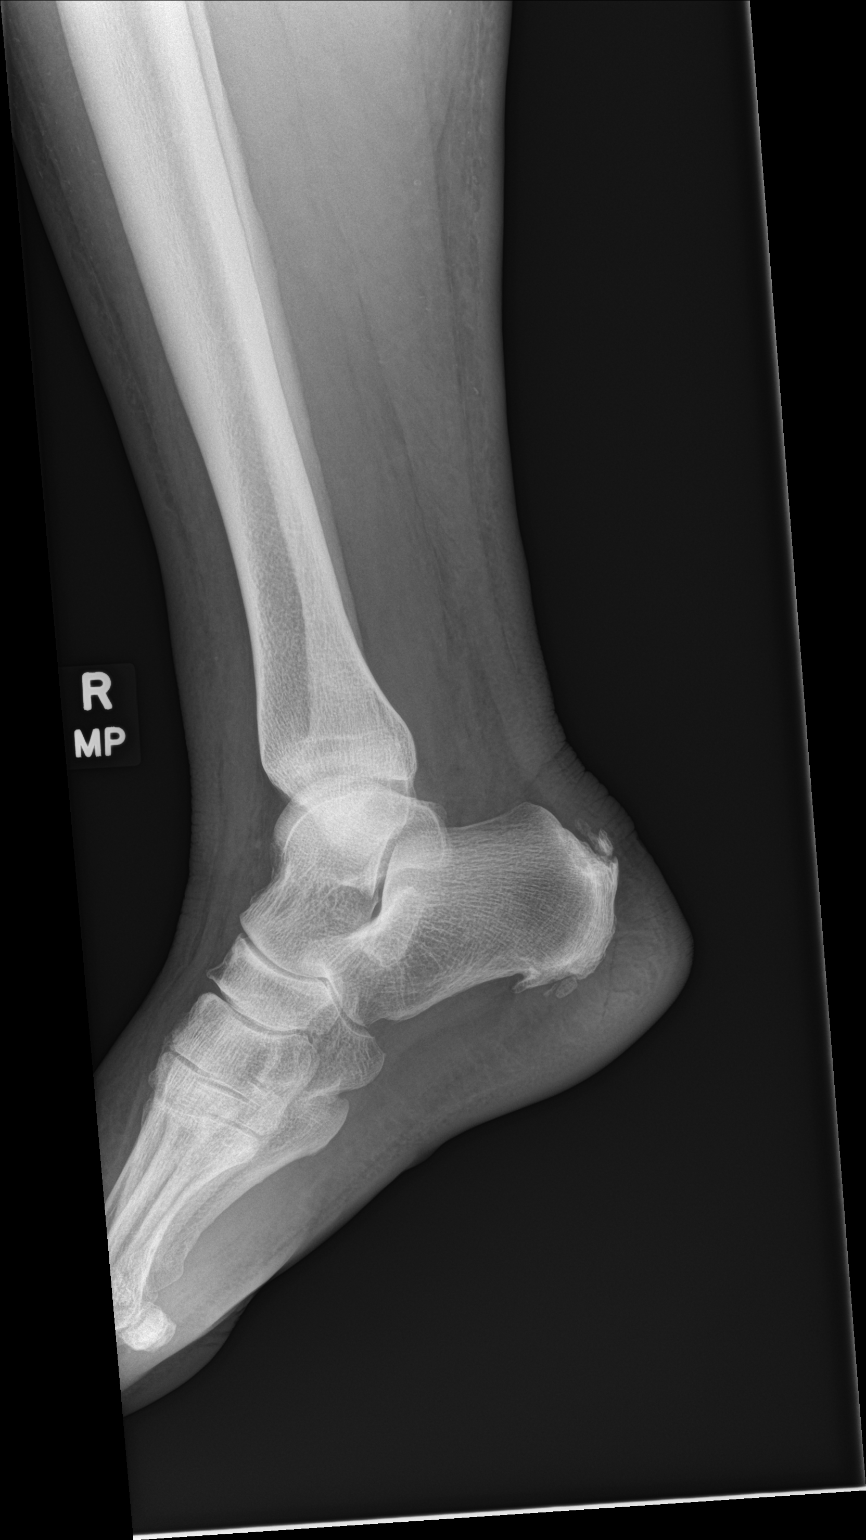

[3 of 3 positions shown; findings below may reference images not displayed]

FINDINGS: Frontal, oblique, and lateral views were obtained. There is soft
tissue swelling. No evident fracture or joint effusion. No joint
space narrowing or erosion. There are posterior and inferior
calcaneal spurs. Ankle mortise appears intact.
IMPRESSION: Soft tissue swelling. No evident fracture or joint space narrowing.
There are calcaneal spurs. Ankle mortise appears intact.

## 2022-09-16 ENCOUNTER — Ambulatory Visit: Payer: Medicare Other | Admitting: Family

## 2022-10-18 ENCOUNTER — Ambulatory Visit (INDEPENDENT_AMBULATORY_CARE_PROVIDER_SITE_OTHER): Payer: Medicare Other | Admitting: Family Medicine

## 2022-10-18 ENCOUNTER — Encounter: Payer: Self-pay | Admitting: Family Medicine

## 2022-10-18 DIAGNOSIS — J069 Acute upper respiratory infection, unspecified: Secondary | ICD-10-CM

## 2022-10-18 MED ORDER — BENZONATATE 100 MG PO CAPS: 100.0000 mg | ORAL_CAPSULE | Freq: Three times a day (TID) | ORAL | 0 refills | Status: DC | PRN

## 2022-10-18 MED ORDER — LEVOCETIRIZINE DIHYDROCHLORIDE 5 MG PO TABS: 5.0000 mg | ORAL_TABLET | Freq: Every evening | ORAL | 99 refills | Status: DC | PRN

## 2022-10-18 MED ORDER — PROMETHAZINE-DM 6.25-15 MG/5ML PO SYRP: 2.5000 mL | ORAL_SOLUTION | Freq: Four times a day (QID) | ORAL | 0 refills | Status: DC | PRN

## 2022-10-18 NOTE — Progress Notes (Signed)
Telephone visit  Subjective: KG:MWNUU infection? PCP: [name], FNP VOZ:DGUYQI MONICK RENA is a 66 y.o. female calls for telephone consult today. Patient provides verbal consent for consult held via phone.  Due to COVID-19 pandemic this visit was conducted virtually. This visit type was conducted due to national recommendations for restrictions regarding the COVID-19 Pandemic (e.g. social distancing, sheltering in place) in an effort to limit this patient's exposure and mitigate transmission in our community. All issues noted in this document were discussed and addressed.  A physical exam was not performed with this format.   Location of patient: home Location of provider: [facility] Others present for call: none  1. Sinusitis Patient reports hoarseness, sinus pressure/ drainage, cough that's worse at night time.  No hemoptysis or brown sputum.  No facial or dental pain. No fevers, wheezing or shortness of breath (unless she exerts her self=baseline).  Onset a few days.  Home COVID test negative x3.  She is using delsym.   ROS: Per HPI  Allergies  Allergen Reactions   Metformin And Related     GI distress    Asa [Aspirin] Rash   Penicillins Rash    Did it involve swelling of the face/tongue/throat, SOB, or low BP? Yes Did it involve sudden or severe rash/hives, skin peeling, or any reaction on the inside of your mouth or nose? Yes Did you need to seek medical attention at a hospital or doctor's office? Yes When did it last happen? Over 10 years ago If all above answers are "NO", may proceed with cephalosporin use.    Past Medical History:  Diagnosis Date   Anxiety    Depression    Hypertension    Thyroid disease     Current Outpatient Medications:    amLODipine (NORVASC) 10 MG tablet, TAKE ONE (1) TABLET EACH DAY (NEEDS TO BE SEEN BEFORE NEXT REFILL), Disp: 90 tablet, Rfl: 3   Ascorbic Acid (VITAMIN C ADULT GUMMIES PO), Take by mouth., Disp: , Rfl:    benazepril (LOTENSIN)  40 MG tablet, TAKE ONE (1) TABLET EACH DAY (NEEDS TO BE SEEN BEFORE NEXT REFILL), Disp: 90 tablet, Rfl: 3   blood glucose meter kit and supplies, Dispense based on patient and insurance preference. Use up to four times daily as directed. (FOR ICD-10 E10.9, E11.9)., Disp: 1 each, Rfl: 0   Blood Glucose Monitoring Suppl (GNP EASY TOUCH GLUCOSE METER) DEVI, Check BS up to QID Dx E11.9, Disp: 1 each, Rfl: 0   busPIRone (BUSPAR) 5 MG tablet, Take 1 tablet (5 mg total) by mouth 3 (three) times daily as needed., Disp: 90 tablet, Rfl: 1   DULoxetine (CYMBALTA) 30 MG capsule, Take 1 capsule (30 mg total) by mouth daily., Disp: 90 capsule, Rfl: 3   glucose blood (GNP EASY TOUCH GLUCOSE TEST) test strip, Check BS up to QID Dx E11.9, Disp: 400 each, Rfl: 3   hydrochlorothiazide (HYDRODIURIL) 25 MG tablet, TAKE ONE (1) TABLET EACH DAY (NEEDS TO BE SEEN BEFORE NEXT REFILL), Disp: 90 tablet, Rfl: 3   insulin NPH-regular Human (70-30) 100 UNIT/ML injection, Inject 13-15 Units into the skin daily with breakfast., Disp: , Rfl:    Insulin Pen Needle 29G X 5MM MISC, Use with Insulin twice daily Dx E11.69, Disp: 180 each, Rfl: 3   levothyroxine (SYNTHROID) 200 MCG tablet, Take 1 tablet (200 mcg total) by mouth daily before breakfast. with 25 mg (Total 225 mg), Disp: 90 tablet, Rfl: 1   levothyroxine (SYNTHROID) 25 MCG tablet, Take  1 tablet (25 mcg total) by mouth daily before breakfast. with 200 mg (Total 225 mg), Disp: 90 tablet, Rfl: 1   meloxicam (MOBIC) 7.5 MG tablet, Take 1 tablet (7.5 mg total) by mouth daily., Disp: 90 tablet, Rfl: 3   rosuvastatin (CRESTOR) 5 MG tablet, Take 1 tablet (5 mg total) by mouth daily., Disp: 90 tablet, Rfl: 3   Semaglutide,0.25 or 0.5MG/DOS, 2 MG/3ML SOPN, Inject 0.25 mg into the skin once a week., Disp: 3 mL, Rfl: 0   Semaglutide,0.25 or 0.5MG/DOS, 2 MG/3ML SOPN, Inject 0.5 mg into the skin once a week., Disp: 3 mL, Rfl: 0   TRUEplus Lancets 30G MISC, Check BS up to QID Dx E11.9, Disp:  400 each, Rfl: 3  Assessment/ Plan: 66 y.o. female   URI with cough and congestion - Plan: benzonatate (TESSALON PERLES) 100 MG capsule, promethazine-dextromethorphan (PROMETHAZINE-DM) 6.25-15 MG/5ML syrup, levocetirizine (XYZAL) 5 MG tablet  Sounds viral.  We did do signs and symptoms that would suggest bacterial infection.  She will contact us if she develops any of these and I will be glad to send an antibiotic.  We will CC to PCP in the event that I am not here.  I sent her in cough Perles, cough syrup for bedtime use.  Caution sedation.  Xyzal sent for bedtime use to help with the drainage that she is experiencing.  Continue to hydrate well.  May continue Mucinex if needed.  Follow-up as needed  Start time: 11:44a (LVM); 12:11pm End time: 12:21pm  Total time spent on patient care (including telephone call/ virtual visit): 10 minutes  [place], [place] [phone]

## 2022-10-18 NOTE — Patient Instructions (Signed)

## 2022-10-19 ENCOUNTER — Ambulatory Visit: Payer: Medicare Other

## 2022-10-19 NOTE — Progress Notes (Deleted)
Subjective:   Cynthia Espinoza is a 66 y.o. female who presents for Medicare Annual (Subsequent) preventive examination.  Review of Systems    ***       Objective:    There were no vitals filed for this visit. There is no height or weight on file to calculate BMI.     09/29/2021    4:31 PM 04/12/2019   12:11 PM 04/02/2019   11:00 PM 04/02/2019    6:28 PM  Advanced Directives  Does Patient Have a Medical Advance Directive? No No No No  Would patient like information on creating a medical advance directive? No - Patient declined No - Patient declined No - Patient declined     Current Medications (verified) Outpatient Encounter Medications as of 10/19/2022  Medication Sig  . amLODipine (NORVASC) 10 MG tablet TAKE ONE (1) TABLET EACH DAY (NEEDS TO BE SEEN BEFORE NEXT REFILL)  . Ascorbic Acid (VITAMIN C ADULT GUMMIES PO) Take by mouth.  . benazepril (LOTENSIN) 40 MG tablet TAKE ONE (1) TABLET EACH DAY (NEEDS TO BE SEEN BEFORE NEXT REFILL)  . benzonatate (TESSALON PERLES) 100 MG capsule Take 1 capsule (100 mg total) by mouth 3 (three) times daily as needed for cough.  . blood glucose meter kit and supplies Dispense based on patient and insurance preference. Use up to four times daily as directed. (FOR ICD-10 E10.9, E11.9).  [name] Kitchen Blood Glucose Monitoring Suppl (GNP EASY TOUCH GLUCOSE METER) DEVI Check BS up to QID Dx E11.9  . busPIRone (BUSPAR) 5 MG tablet Take 1 tablet (5 mg total) by mouth 3 (three) times daily as needed.  . DULoxetine (CYMBALTA) 30 MG capsule Take 1 capsule (30 mg total) by mouth daily.  [name] Kitchen glucose blood (GNP EASY TOUCH GLUCOSE TEST) test strip Check BS up to QID Dx E11.9  . hydrochlorothiazide (HYDRODIURIL) 25 MG tablet TAKE ONE (1) TABLET EACH DAY (NEEDS TO BE SEEN BEFORE NEXT REFILL)  . insulin NPH-regular Human (70-30) 100 UNIT/ML injection Inject 13-15 Units into the skin daily with breakfast.  . Insulin Pen Needle 29G X 5MM MISC Use with Insulin twice daily Dx  E11.69  . levocetirizine (XYZAL) 5 MG tablet Take 1 tablet (5 mg total) by mouth at bedtime as needed (drainage).  [name] Kitchen levothyroxine (SYNTHROID) 200 MCG tablet Take 1 tablet (200 mcg total) by mouth daily before breakfast. with 25 mg (Total 225 mg)  . levothyroxine (SYNTHROID) 25 MCG tablet Take 1 tablet (25 mcg total) by mouth daily before breakfast. with 200 mg (Total 225 mg)  . meloxicam (MOBIC) 7.5 MG tablet Take 1 tablet (7.5 mg total) by mouth daily.  . promethazine-dextromethorphan (PROMETHAZINE-DM) 6.25-15 MG/5ML syrup Take 2.5 mLs by mouth 4 (four) times daily as needed for cough.  . rosuvastatin (CRESTOR) 5 MG tablet Take 1 tablet (5 mg total) by mouth daily.  . Semaglutide,0.25 or 0.5MG/DOS, 2 MG/3ML SOPN Inject 0.25 mg into the skin once a week.  . Semaglutide,0.25 or 0.5MG/DOS, 2 MG/3ML SOPN Inject 0.5 mg into the skin once a week.  . TRUEplus Lancets 30G MISC Check BS up to QID Dx E11.9   No facility-administered encounter medications on file as of 10/19/2022.    Allergies (verified) Metformin and related, Asa [aspirin], and Penicillins   History: Past Medical History:  Diagnosis Date  . Anxiety   . Depression   . Hypertension   . Thyroid disease    Past Surgical History:  Procedure Laterality Date  . CESAREAN SECTION  Family History  Problem Relation Age of Onset  . Hypertension Mother   . Thyroid disease Mother   . Osteoporosis Mother    Social History   Socioeconomic History  . Marital status: Single    Spouse name: Not on file  . Number of children: 1  . Years of education: Not on file  . Highest education level: Not on file  Occupational History  . Occupation: disability since 2019  Tobacco Use  . Smoking status: Never  . Smokeless tobacco: Never  Vaping Use  . Vaping Use: Never used  Substance and Sexual Activity  . Alcohol use: No  . Drug use: No  . Sexual activity: Not on file  Other Topics Concern  . Not on file  Social History Narrative    Son lives next door   Social Determinants of Health   Financial Resource Strain: Low Risk  (09/29/2021)   Overall Financial Resource Strain (CARDIA)   . Difficulty of Paying Living Expenses: Not hard at all  Food Insecurity: No Food Insecurity (09/29/2021)   Hunger Vital Sign   . Worried About Charity fundraiser in the Last Year: Never true   . Ran Out of Food in the Last Year: Never true  Transportation Needs: No Transportation Needs (09/29/2021)   PRAPARE - Transportation   . Lack of Transportation (Medical): No   . Lack of Transportation (Non-Medical): No  Physical Activity: Insufficiently Active (12/07/2021)   Exercise Vital Sign   . Days of Exercise per Week: 1 day   . Minutes of Exercise per Session: 10 min  Stress: Stress Concern Present (12/07/2021)   [place]   . Feeling of Stress : To some extent  Social Connections: Moderately Integrated (09/29/2021)   Social Connection and Isolation Panel [NHANES]   . Frequency of Communication with Friends and Family: More than three times a week   . Frequency of Social Gatherings with Friends and Family: More than three times a week   . Attends Religious Services: More than 4 times per year   . Active Member of Clubs or Organizations: Yes   . Attends Archivist Meetings: 1 to 4 times per year   . Marital Status: Divorced    Tobacco Counseling Counseling given: Not Answered   Clinical Intake:                 Diabetic?***         Activities of Daily Living     No data to display           Patient Care Team: [name], FNP as PCP - General (Family Medicine) Shea Setterlund, Norva Riffle, LCSW as Social Worker (Licensed Clinical Social Worker)  Indicate any recent [employer] you may have received from other than [facility] providers in the past year (date may be approximate).     Assessment:   This is a routine wellness  examination for [name].  Hearing/Vision screen No results found.  Dietary issues and exercise activities discussed:     Goals Addressed   None   Depression Screen    06/13/2022    3:00 PM 12/07/2021    5:22 PM 10/06/2021    2:37 PM 09/29/2021    4:25 PM 05/28/2021   12:17 PM 05/28/2021    9:45 AM 04/01/2021    3:32 PM  PHQ 2/9 Scores  PHQ - 2 Score _0 0  PHQ-  9 Score _0 Fall Risk    09/29/2021    4:35 PM 04/01/2021    3:32 PM 02/04/2020    4:00 PM 10/15/2019    2:56 PM 06/06/2019    9:21 AM  Fall Risk   Falls in the past year? 0 0 1 1 0  Number falls in past yr: 0  0 0   Injury with Fall? 0  0 0   Risk for fall due to : Orthopedic patient;Impaired balance/gait      Follow up Falls prevention discussed        FALL RISK PREVENTION PERTAINING TO THE HOME:  Any stairs in or around the home? {YES/NO:21197} If so, are there any without handrails? {YES/NO:21197} Home free of loose throw rugs in walkways, pet beds, electrical cords, etc? {YES/NO:21197} Adequate lighting in your home to reduce risk of falls? {YES/NO:21197}  ASSISTIVE DEVICES UTILIZED TO PREVENT FALLS:  Life alert? {YES/NO:21197} Use of a cane, walker or w/c? {YES/NO:21197} Grab bars in the bathroom? {YES/NO:21197} Shower chair or bench in shower? {YES/NO:21197} Elevated toilet seat or a handicapped toilet? {YES/NO:21197}  TIMED UP AND GO:  Was the test performed? {YES/NO:21197}.  Length of time to ambulate 10 feet: *** sec.   {Appearance of VXBL:3903009}  Cognitive Function:        09/29/2021    4:37 PM  6CIT Screen  What Year? 0 points  What month? 0 points  What time? 0 points  Count back from 20 0 points  Months in reverse 0 points  Repeat phrase 0 points  Total Score 0 points    Immunizations Immunization History  Administered Date(s) Administered  . PFIZER Comirnaty(Gray Top)Covid-19 Tri-Sucrose Vaccine 07/25/2020, 08/18/2020    {TDAP  status:2101805}  {Flu Vaccine status:2101806}  {Pneumococcal vaccine status:2101807}  {Covid-19 vaccine status:2101808}  Qualifies for Shingles Vaccine? {YES/NO:21197}  Zostavax completed {YES/NO:21197}  {Shingrix Completed?:2101804}  Screening Tests Health Maintenance  Topic Date Due  . OPHTHALMOLOGY EXAM  Never done  . DTaP/Tdap/Td (1 - Tdap) Never done  . Zoster Vaccines- Shingrix (1 of 2) Never done  . PAP SMEAR-Modifier  Never done  . Fecal DNA (Cologuard)  Never done  . MAMMOGRAM  Never done  . COVID-19 Vaccine (3 - Pfizer risk series) 09/15/2020  . INFLUENZA VACCINE  Never done  . HEMOGLOBIN A1C  12/14/2022  . Diabetic kidney evaluation - GFR measurement  06/14/2023  . Diabetic kidney evaluation - Urine ACR  06/14/2023  . FOOT EXAM  06/14/2023  . Medicare Annual Wellness (AWV)  10/20/2023  . Hepatitis C Screening  Completed  . HIV Screening  Completed  . HPV VACCINES  Aged Out    Health Maintenance  Health Maintenance Due  Topic Date Due  . OPHTHALMOLOGY EXAM  Never done  . DTaP/Tdap/Td (1 - Tdap) Never done  . Zoster Vaccines- Shingrix (1 of 2) Never done  . PAP SMEAR-Modifier  Never done  . Fecal DNA (Cologuard)  Never done  . MAMMOGRAM  Never done  . COVID-19 Vaccine (3 - Pfizer risk series) 09/15/2020  . INFLUENZA VACCINE  Never done    {Colorectal cancer screening:2101809}  {Mammogram status:21018020}  {Bone Density status:21018021}  Lung Cancer Screening: (Low Dose CT Chest recommended if Age 54-80 years, 30 pack-year currently smoking OR have quit w/in 15years.) {DOES NOT does:27190::"does not"} qualify.   Lung Cancer Screening Referral: ***  Additional Screening:  Hepatitis C Screening: {DOES NOT does:27190::"does not"} qualify; Completed ***  Vision Screening: Recommended annual ophthalmology exams for early detection of glaucoma and other disorders of the eye. Is the patient up to date with their annual eye exam?  {YES/NO:21197} Who is  the provider or what is the name of the office in which the patient attends annual eye exams? *** If pt is not established with a provider, would they like to be referred to a provider to establish care? {YES/NO:21197}.   Dental Screening: Recommended annual dental exams for proper oral hygiene  Community Resource Referral / Chronic Care Management: CRR required this visit?  {YES/NO:21197}  CCM required this visit?  {YES/NO:21197}     Plan:     I have personally reviewed and noted the following in the patient's chart:   Medical and social history Use of alcohol, tobacco or illicit drugs  Current medications and supplements including opioid prescriptions. {Opioid Prescriptions:[phone]} Functional ability and status Nutritional status Physical activity Advanced directives List of other physicians Hospitalizations, surgeries, and ER visits in previous 12 months Vitals Screenings to include cognitive, depression, and falls Referrals and appointments  In addition, I have reviewed and discussed with patient certain preventive protocols, quality metrics, and best practice recommendations. A written personalized care plan for preventive services as well as general preventive health recommendations were provided to patient.     Cindy Hazy, [place]   10/19/2022   Nurse Notes: ***

## 2022-11-25 ENCOUNTER — Telehealth: Payer: Self-pay | Admitting: Family

## 2022-11-25 NOTE — Telephone Encounter (Signed)
Left message for patient to call back and schedule Medicare Annual Wellness Visit (AWV) to be completed by video or phone.   Last AWV: 09/29/2021   Please schedule at anytime with [place]     Any questions, please contact me at [phone]   Thank you,   [facility] Ambulatory Clinical Support for [place] Are. [facility] ??[phone] or ??[phone]

## 2022-12-15 ENCOUNTER — Other Ambulatory Visit: Payer: Self-pay | Admitting: Family

## 2022-12-15 DIAGNOSIS — E039 Hypothyroidism, unspecified: Secondary | ICD-10-CM

## 2022-12-22 ENCOUNTER — Encounter: Payer: Self-pay | Admitting: Family

## 2022-12-26 ENCOUNTER — Telehealth: Payer: Self-pay | Admitting: Family

## 2022-12-26 NOTE — Telephone Encounter (Signed)
Left message for patient to call back and schedule Medicare Annual Wellness Visit (AWV) to be completed by video or phone.   Last AWV: 09/29/2021   Please schedule at anytime with [place]     Any questions, please contact me at [phone]   Thank you,   [facility] Ambulatory Clinical Support for [place] Are. [facility] ??[phone] or ??[phone]

## 2022-12-30 ENCOUNTER — Telehealth: Payer: Self-pay | Admitting: Family

## 2022-12-30 NOTE — Telephone Encounter (Signed)
Called patient to schedule Medicare Annual Wellness Visit (AWV). Left message for patient to call back and schedule Medicare Annual Wellness Visit (AWV).  Last date of AWV: 09/29/2021   Please schedule an appointment at any time with [facility].  If any questions, please contact me at [phone].  Thank you ,  Thank you,  [place] ??[phone]

## 2023-02-06 ENCOUNTER — Telehealth: Payer: Self-pay | Admitting: Family

## 2023-02-06 NOTE — Telephone Encounter (Signed)
Called patient to schedule Medicare Annual Wellness Visit (AWV). Left message for patient to call back and schedule Medicare Annual Wellness Visit (AWV).  Last date of AWV: : 09/29/2021   Please schedule an appointment at any time with either Mickel Baas or [place], [facility]'s. .  If any questions, please contact me at [phone].  Thank you,  Colletta [place],  [place] Program Direct Dial ??[phone]

## 2023-03-06 ENCOUNTER — Ambulatory Visit: Payer: Medicare Other | Admitting: Family

## 2023-03-14 ENCOUNTER — Telehealth: Payer: Self-pay | Admitting: Family

## 2023-03-14 ENCOUNTER — Other Ambulatory Visit: Payer: Self-pay | Admitting: Family

## 2023-03-14 DIAGNOSIS — I10 Essential (primary) hypertension: Secondary | ICD-10-CM

## 2023-03-14 NOTE — Telephone Encounter (Signed)
Contacted [name] to schedule their annual wellness visit. Appointment made for 03/28/2023.  Thank you,  Judeth Cornfield,  AMB Clinical Support [facility] AWV Program Direct Dial ??[phone]

## 2023-03-28 ENCOUNTER — Ambulatory Visit (INDEPENDENT_AMBULATORY_CARE_PROVIDER_SITE_OTHER): Payer: Medicare Other

## 2023-03-28 VITALS — Ht 67.0 in | Wt 282.0 lb

## 2023-03-28 DIAGNOSIS — Z0000 Encounter for general adult medical examination without abnormal findings: Secondary | ICD-10-CM | POA: Diagnosis not present

## 2023-03-28 DIAGNOSIS — Z0100 Encounter for examination of eyes and vision without abnormal findings: Secondary | ICD-10-CM

## 2023-03-28 NOTE — Patient Instructions (Signed)
Cynthia Espinoza , Thank you for taking time to come for your Medicare Wellness Visit. I appreciate your ongoing commitment to your health goals. Please review the following plan we discussed and let me know if I can assist you in the future.   These are the goals we discussed:  Goals       Client wants to talk with LCSW about depression/stress issues facing client (pt-stated)      Current Barriers:  Pain issues from fibromyalgia in client with Chronic Diagnoses of Type 2 DM, Hx PTSD, Major Depressive Disorder, HTN and Hypothyroidism Finances Challenges Has been working on Disabilty appication but it is not approved yet. This is challenge area  Clinical Social Work Clinical Goal(s):  LCSW and client will talk in next 30 days about depression and stress issues faced by client  Interventions: Provided patient with information about LCSW support   Talked about depression issues facing client  Talked with client about barriers facing client Talked with client about relaxation techniques of choice (watch TV, takes walks with use of walker, likes to talk with family or friends via phone) Talked with client about PTSD and past treatment for PTSD.    Patient Self Care Activities:  Self administers medications as prescribed Attends all scheduled provider appointments   Plan:  Client to attend scheduled medical appointments Client to use relaxation techniques of choice to help her manage depression symptoms LCSW to call client in next 3 weeks to talk with clinet about mangement of depression symptoms .  Initial goal documentation       Exercise 3x per week (30 min per time)      Manage My Emotions; Manage anxiety and stress issues faced      Timeframe:  Short-Term Goal Priority:  Medium Progress: On Track Start Date:     12/07/21                       Expected End Date:   03/01/22                  Follow Up Date  02/01/22 at 2:00 PM  Manage My Emotions (Patient) Manage Anxiety and Stress  issues faced  Why This is important?   When you are stressed, down or upset, your body reacts too.  For example, your blood pressure may get higher; you may have a headache or stomachache.  When your emotions get the best of you, your body's ability to fight off cold and flu gets weak.  These steps will help you manage your emotions.     Patient Self Care Activities:  Self administers medications as prescribed Attends all scheduled provider appointments Performs ADL's independently  Patient Coping Strengths:  Family Friends Spirituality Hopefulness  Patient Self Care Deficits:  Challenges in managing anxiety or stress issues faced  Patient Goals:  - avoid negative self-talk - spend time or talk with others every day - practice relaxation or meditation daily - talk about feelings with a friend, family or spiritual advisor - keep a calendar with appointment dates  Follow Up Plan: LCSW to call client or son of client on 02/01/22 at 2:00 PM to assess needs of client        Protect My Health        This is a list of the screening recommended for you and due dates:  Health Maintenance  Topic Date Due   Eye exam for diabetics  Never done   DTaP/Tdap/Td  vaccine (1 - Tdap) Never done   Zoster (Shingles) Vaccine (1 of 2) Never done   Pap Smear  Never done   COVID-19 Vaccine (3 - Pfizer risk series) 09/15/2020   Hemoglobin A1C  12/14/2022   Mammogram  10/21/2023*   Cologuard (Stool DNA test)  10/21/2023*   Yearly kidney function blood test for diabetes  06/14/2023   Yearly kidney health urinalysis for diabetes  06/14/2023   Complete foot exam   06/14/2023   Flu Shot  06/15/2023   Medicare Annual Wellness Visit  03/27/2024   Hepatitis C Screening: USPSTF Recommendation to screen - Ages 18-79 yo.  Completed   HIV Screening  Completed   HPV Vaccine  Aged Out  *Topic was postponed. The date shown is not the original due date.    Advanced directives: Advance directive  discussed with you today. I have provided a copy for you to complete at home and have notarized. Once this is complete please bring a copy in to our office so we can scan it into your chart.   Conditions/risks identified: Aim for 30 minutes of exercise or brisk walking, 6-8 glasses of water, and 5 servings of fruits and vegetables each day.  Next appointment: Follow up in one year for your annual wellness visit.   Preventive Care 40-64 Years, Female Preventive care refers to lifestyle choices and visits with your health care provider that can promote health and wellness. What does preventive care include? A yearly physical exam. This is also called an annual well check. Dental exams once or twice a year. Routine eye exams. Ask your health care provider how often you should have your eyes checked. Personal lifestyle choices, including: Daily care of your teeth and gums. Regular physical activity. Eating a healthy diet. Avoiding tobacco and drug use. Limiting alcohol use. Practicing safe sex. Taking low-dose aspirin daily starting at age 41. Taking vitamin and mineral supplements as recommended by your health care provider. What happens during an annual well check? The services and screenings done by your health care provider during your annual well check will depend on your age, overall health, lifestyle risk factors, and family history of disease. Counseling  Your health care provider may ask you questions about your: Alcohol use. Tobacco use. Drug use. Emotional well-being. Home and relationship well-being. Sexual activity. Eating habits. Work and work Astronomer. Method of birth control. Menstrual cycle. Pregnancy history. Screening  You may have the following tests or measurements: Height, weight, and BMI. Blood pressure. Lipid and cholesterol levels. These may be checked every 5 years, or more frequently if you are over 21 years old. Skin check. Lung cancer screening.  You may have this screening every year starting at age 13 if you have a 30-pack-year history of smoking and currently smoke or have quit within the past 15 years. Fecal occult blood test (FOBT) of the stool. You may have this test every year starting at age 40. Flexible sigmoidoscopy or colonoscopy. You may have a sigmoidoscopy every 5 years or a colonoscopy every 10 years starting at age 59. Hepatitis C blood test. Hepatitis B blood test. Sexually transmitted disease (STD) testing. Diabetes screening. This is done by checking your blood sugar (glucose) after you have not eaten for a while (fasting). You may have this done every 1-3 years. Mammogram. This may be done every 1-2 years. Talk to your health care provider about when you should start having regular mammograms. This may depend on whether you have a family history  of breast cancer. BRCA-related cancer screening. This may be done if you have a family history of breast, ovarian, tubal, or peritoneal cancers. Pelvic exam and Pap test. This may be done every 3 years starting at age 66. Starting at age 54, this may be done every 5 years if you have a Pap test in combination with an HPV test. Bone density scan. This is done to screen for osteoporosis. You may have this scan if you are at high risk for osteoporosis. Discuss your test results, treatment options, and if necessary, the need for more tests with your health care provider. Vaccines  Your health care provider may recommend certain vaccines, such as: Influenza vaccine. This is recommended every year. Tetanus, diphtheria, and acellular pertussis (Tdap, Td) vaccine. You may need a Td booster every 10 years. Zoster vaccine. You may need this after age 63. Pneumococcal 13-valent conjugate (PCV13) vaccine. You may need this if you have certain conditions and were not previously vaccinated. Pneumococcal polysaccharide (PPSV23) vaccine. You may need one or two doses if you smoke cigarettes or  if you have certain conditions. Talk to your health care provider about which screenings and vaccines you need and how often you need them. This information is not intended to replace advice given to you by your health care provider. Make sure you discuss any questions you have with your health care provider. Document Released: 11/27/2015 Document Revised: 07/20/2016 Document Reviewed: 09/01/2015 Elsevier Interactive Patient Education  2017 [employer].    Fall Prevention in the Home Falls can cause injuries. They can happen to people of all ages. There are many things you can do to make your home safe and to help prevent falls. What can I do on the outside of my home? Regularly fix the edges of walkways and driveways and fix any cracks. Remove anything that might make you trip as you walk through a door, such as a raised step or threshold. Trim any bushes or trees on the path to your home. Use bright outdoor lighting. Clear any walking paths of anything that might make someone trip, such as rocks or tools. Regularly check to see if handrails are loose or broken. Make sure that both sides of any steps have handrails. Any raised decks and porches should have guardrails on the edges. Have any leaves, snow, or ice cleared regularly. Use sand or salt on walking paths during winter. Clean up any spills in your garage right away. This includes oil or grease spills. What can I do in the bathroom? Use night lights. Install grab bars by the toilet and in the tub and shower. Do not use towel bars as grab bars. Use non-skid mats or decals in the tub or shower. If you need to sit down in the shower, use a plastic, non-slip stool. Keep the floor dry. Clean up any water that spills on the floor as soon as it happens. Remove soap buildup in the tub or shower regularly. Attach bath mats securely with double-sided non-slip rug tape. Do not have throw rugs and other things on the floor that can make you  trip. What can I do in the bedroom? Use night lights. Make sure that you have a light by your bed that is easy to reach. Do not use any sheets or blankets that are too big for your bed. They should not hang down onto the floor. Have a firm chair that has side arms. You can use this for support while you get dressed.  Do not have throw rugs and other things on the floor that can make you trip. What can I do in the kitchen? Clean up any spills right away. Avoid walking on wet floors. Keep items that you use a lot in easy-to-reach places. If you need to reach something above you, use a strong step stool that has a grab bar. Keep electrical cords out of the way. Do not use floor polish or wax that makes floors slippery. If you must use wax, use non-skid floor wax. Do not have throw rugs and other things on the floor that can make you trip. What can I do with my stairs? Do not leave any items on the stairs. Make sure that there are handrails on both sides of the stairs and use them. Fix handrails that are broken or loose. Make sure that handrails are as long as the stairways. Check any carpeting to make sure that it is firmly attached to the stairs. Fix any carpet that is loose or worn. Avoid having throw rugs at the top or bottom of the stairs. If you do have throw rugs, attach them to the floor with carpet tape. Make sure that you have a light switch at the top of the stairs and the bottom of the stairs. If you do not have them, ask someone to add them for you. What else can I do to help prevent falls? Wear shoes that: Do not have high heels. Have rubber bottoms. Are comfortable and fit you well. Are closed at the toe. Do not wear sandals. If you use a stepladder: Make sure that it is fully opened. Do not climb a closed stepladder. Make sure that both sides of the stepladder are locked into place. Ask someone to hold it for you, if possible. Clearly mark and make sure that you can  see: Any grab bars or handrails. First and last steps. Where the edge of each step is. Use tools that help you move around (mobility aids) if they are needed. These include: Canes. Walkers. Scooters. Crutches. Turn on the lights when you go into a dark area. Replace any light bulbs as soon as they burn out. Set up your furniture so you have a clear path. Avoid moving your furniture around. If any of your floors are uneven, fix them. If there are any pets around you, be aware of where they are. Review your medicines with your doctor. Some medicines can make you feel dizzy. This can increase your chance of falling. Ask your doctor what other things that you can do to help prevent falls. This information is not intended to replace advice given to you by your health care provider. Make sure you discuss any questions you have with your health care provider. Document Released: 08/27/2009 Document Revised: 04/07/2016 Document Reviewed: 12/05/2014 Elsevier Interactive Patient Education  2017 [employer].

## 2023-03-28 NOTE — Progress Notes (Signed)
Subjective:   Cynthia Espinoza is a 67 y.o. female who presents for Medicare Annual (Subsequent) preventive examination. I connected with  [name] on 03/28/23 by a audio enabled telemedicine application and verified that I am speaking with the correct person using two identifiers.  Patient Location: Home  Provider Location: Home Office  I discussed the limitations of evaluation and management by telemedicine. The patient expressed understanding and agreed to proceed.  Review of Systems     Cardiac Risk Factors include: advanced age (>31men, >21 women);diabetes mellitus;dyslipidemia;hypertension     Objective:    Today's Vitals   03/28/23 1523  Weight: 282 lb (127.9 kg)  Height: 5\' 7"  (1.702 m)   Body mass index is 44.17 kg/m.     03/28/2023    3:27 PM 09/29/2021    4:31 PM 04/12/2019   12:11 PM 04/02/2019   11:00 PM 04/02/2019    6:28 PM  Advanced Directives  Does Patient Have a Medical Advance Directive? No No No No No  Would patient like information on creating a medical advance directive? No - Patient declined No - Patient declined No - Patient declined No - Patient declined     Current Medications (verified) Outpatient Encounter Medications as of 03/28/2023  Medication Sig   amLODipine (NORVASC) 10 MG tablet TAKE ONE (1) TABLET EACH DAY (NEEDS TO BE SEEN BEFORE NEXT REFILL)   Ascorbic Acid (VITAMIN C ADULT GUMMIES PO) Take by mouth.   benazepril (LOTENSIN) 40 MG tablet TAKE ONE (1) TABLET EACH DAY (NEEDS TO BE SEEN BEFORE NEXT REFILL)   blood glucose meter kit and supplies Dispense based on patient and insurance preference. Use up to four times daily as directed. (FOR ICD-10 E10.9, E11.9).   Blood Glucose Monitoring Suppl (GNP EASY TOUCH GLUCOSE METER) DEVI Check BS up to QID Dx E11.9   busPIRone (BUSPAR) 5 MG tablet Take 1 tablet (5 mg total) by mouth 3 (three) times daily as needed.   DULoxetine (CYMBALTA) 30 MG capsule Take 1 capsule (30 mg total) by mouth  daily.   glucose blood (GNP EASY TOUCH GLUCOSE TEST) test strip Check BS up to QID Dx E11.9   hydrochlorothiazide (HYDRODIURIL) 25 MG tablet TAKE ONE (1) TABLET EACH DAY   insulin NPH-regular Human (70-30) 100 UNIT/ML injection Inject 13-15 Units into the skin daily with breakfast.   Insulin Pen Needle 29G X MISC Use with Insulin twice daily Dx E11.69   levocetirizine (XYZAL) 5 MG tablet Take 1 tablet (5 mg total) by mouth at bedtime as needed (drainage).   levothyroxine (SYNTHROID) 200 MCG tablet TAKE 1 TABLET BY MOUTH EVERY MORNING BEFORE BREAKFAST   levothyroxine (SYNTHROID) 25 MCG tablet TAKE 1 TABLET BY MOUTH EVERY MORNING BEFORE BREAKFAST   meloxicam (MOBIC) 7.5 MG tablet Take 1 tablet (7.5 mg total) by mouth daily.   promethazine-dextromethorphan (PROMETHAZINE-DM) 6.25-15 MG/5ML syrup Take 2.5 mLs by mouth 4 (four) times daily as needed for cough.   rosuvastatin (CRESTOR) 5 MG tablet Take 1 tablet (5 mg total) by mouth daily.   Semaglutide,0.25 or 0.5MG /DOS, 2 MG/3ML SOPN Inject 0.25 mg into the skin once a week.   Semaglutide,0.25 or 0.5MG /DOS, 2 MG/3ML SOPN Inject 0.5 mg into the skin once a week.   TRUEplus Lancets 30G MISC Check BS up to QID Dx E11.9   benzonatate (TESSALON PERLES) 100 MG capsule Take 1 capsule (100 mg total) by mouth 3 (three) times daily as needed for cough. (Patient not taking: Reported on  03/28/2023)   No facility-administered encounter medications on file as of 03/28/2023.    Allergies (verified) Metformin and related, Asa [aspirin], and Penicillins   History: Past Medical History:  Diagnosis Date   Anxiety    Depression    Hypertension    Thyroid disease    Past Surgical History:  Procedure Laterality Date   CESAREAN SECTION     Family History  Problem Relation Age of Onset   Hypertension Mother    Thyroid disease Mother    Osteoporosis Mother    Social History   Socioeconomic History   Marital status: Single    Spouse name: Not on file    Number of children: 1   Years of education: Not on file   Highest education level: Not on file  Occupational History   Occupation: disability since 2019  Tobacco Use   Smoking status: Never   Smokeless tobacco: Never  Vaping Use   Vaping Use: Never used  Substance and Sexual Activity   Alcohol use: No   Drug use: No   Sexual activity: Not on file  Other Topics Concern   Not on file  Social History Narrative   Son lives next door   Social Determinants of Health   Financial Resource Strain: Low Risk  (03/28/2023)   Overall Financial Resource Strain (CARDIA)    Difficulty of Paying Living Expenses: Not hard at all  Food Insecurity: No Food Insecurity (03/28/2023)   Hunger Vital Sign    Worried About Running Out of Food in the Last Year: Never true    Ran Out of Food in the Last Year: Never true  Transportation Needs: No Transportation Needs (03/28/2023)   PRAPARE - Administrator, Civil Service (Medical): No    Lack of Transportation (Non-Medical): No  Physical Activity: Sufficiently Active (03/28/2023)   Exercise Vital Sign    Days of Exercise per Week: 5 days    Minutes of Exercise per Session: 30 min  Stress: No Stress Concern Present (03/28/2023)   [employer] of Occupational Health - Occupational Stress Questionnaire    Feeling of Stress : Not at all  Social Connections: Moderately Isolated (03/28/2023)   Social Connection and Isolation Panel [NHANES]    Frequency of Communication with Friends and Family: More than three times a week    Frequency of Social Gatherings with Friends and Family: More than three times a week    Attends Religious Services: More than 4 times per year    Active Member of [employer] or Organizations: No    Attends Engineer, structural: Never    Marital Status: Divorced    Tobacco Counseling Counseling given: Not Answered   Clinical Intake:  Pre-visit preparation completed: Yes  Pain : No/denies pain      Nutritional Risks: None Diabetes: Yes CBG done?: No Did pt. bring in CBG monitor from home?: No  How often do you need to have someone help you when you read instructions, pamphlets, or other written materials from your doctor or pharmacy?: 1 - Never  Diabetic?yes  Nutrition Risk Assessment:  Has the patient had any N/V/D within the last 2 months?  No  Does the patient have any non-healing wounds?  No  Has the patient had any unintentional weight loss or weight gain?  No   Diabetes:  Is the patient diabetic?  Yes  If diabetic, was a CBG obtained today?  No  Did the patient bring in their glucometer from home?  No  How often do you monitor your CBG's? 2 x day .   Financial Strains and Diabetes Management:  Are you having any financial strains with the device, your supplies or your medication? No .  Does the patient want to be seen by Chronic Care Management for management of their diabetes?  No  Would the patient like to be referred to a Nutritionist or for Diabetic Management?  No   Diabetic Exams:  Diabetic Eye Exam: Overdue for diabetic eye exam. Pt has been advised about the importance in completing this exam. Patient advised to call and schedule an eye exam. Diabetic Foot Exam: Overdue, Pt has been advised about the importance in completing this exam. Pt is scheduled for diabetic foot exam on next office visit .   Interpreter Needed?: No  Information entered by :: Renie Ora, LPN   Activities of Daily Living    03/28/2023    3:28 PM 03/28/2023    3:27 PM  In your present state of health, do you have any difficulty performing the following activities:  Hearing? 0 0  Vision? 0 0  Difficulty concentrating or making decisions? 0 0  Walking or climbing stairs? 0 0  Dressing or bathing? 0 0  Doing errands, shopping? 0 0  Preparing Food and eating ? N N  Using the Toilet? N N  In the past six months, have you accidently leaked urine? N N  Do you have problems  with loss of bowel control? N N  Managing your Medications? N N  Managing your Finances? N N  Housekeeping or managing your Housekeeping? N N    Patient Care Team: Junie Spencer, FNP as PCP - General (Family Medicine) Randa Spike, Kelton Pillar, LCSW as Social Worker (Licensed Clinical Social Worker)  Indicate any recent [employer] you may have received from other than [facility] providers in the past year (date may be approximate).     Assessment:   This is a routine wellness examination for Cynthia Espinoza.  Hearing/Vision screen Vision Screening - Comments:: Referral 03/28/2023   Dietary issues and exercise activities discussed: Current Exercise Habits: Home exercise routine, Type of exercise: walking, Time (Minutes): 30, Frequency (Times/Week): 3, Weekly Exercise (Minutes/Week): 90, Intensity: Mild, Exercise limited by: orthopedic condition(s);neurologic condition(s)   Goals Addressed             This Visit's Progress    Exercise 3x per week (30 min per time)         Depression Screen    03/28/2023    3:26 PM 06/13/2022    3:00 PM 12/07/2021    5:22 PM 10/06/2021    2:37 PM 09/29/2021    4:25 PM 05/28/2021   12:17 PM 05/28/2021    9:45 AM  PHQ 2/9 Scores  PHQ - 2 Score 0 2 2 2 1 2 2   PHQ- 9 Score  8 7 8  6 6     Fall Risk    03/28/2023    3:24 PM 09/29/2021    4:35 PM 04/01/2021    3:32 PM 02/04/2020    4:00 PM 10/15/2019    2:56 PM  Fall Risk   Falls in the past year? 1 0 0 1 1  Number falls in past yr: 1 0  0 0  Injury with Fall? 1 0  0 0  Risk for fall due to : History of fall(s);Impaired balance/gait;Orthopedic patient Orthopedic patient;Impaired balance/gait     Follow up Education provided;Falls prevention discussed Falls prevention discussed  FALL RISK PREVENTION PERTAINING TO THE HOME:  Any stairs in or around the home? No  If so, are there any without handrails? No  Home free of loose throw rugs in walkways, pet beds, electrical cords, etc? Yes  Adequate  lighting in your home to reduce risk of falls? Yes   ASSISTIVE DEVICES UTILIZED TO PREVENT FALLS:  Life alert? No  Use of a cane, walker or w/c? Yes  Grab bars in the bathroom? No  Shower chair or bench in shower? Yes  Elevated toilet seat or a handicapped toilet? Yes          03/28/2023    3:28 PM 09/29/2021    4:37 PM  6CIT Screen  What Year? 0 points 0 points  What month? 0 points 0 points  What time? 0 points 0 points  Count back from 20 0 points 0 points  Months in reverse 0 points 0 points  Repeat phrase 0 points 0 points  Total Score 0 points 0 points    Immunizations Immunization History  Administered Date(s) Administered   PFIZER Comirnaty(Gray Top)Covid-19 Tri-Sucrose Vaccine 07/25/2020, 08/18/2020    TDAP status: Due, Education has been provided regarding the importance of this vaccine. Advised may receive this vaccine at local pharmacy or Health Dept. Aware to provide a copy of the vaccination record if obtained from local pharmacy or Health Dept. Verbalized acceptance and understanding.  Flu Vaccine status: Declined, Education has been provided regarding the importance of this vaccine but patient still declined. Advised may receive this vaccine at local pharmacy or Health Dept. Aware to provide a copy of the vaccination record if obtained from local pharmacy or Health Dept. Verbalized acceptance and understanding.  Pneumococcal vaccine status: Due, Education has been provided regarding the importance of this vaccine. Advised may receive this vaccine at local pharmacy or Health Dept. Aware to provide a copy of the vaccination record if obtained from local pharmacy or Health Dept. Verbalized acceptance and understanding.  Covid-19 vaccine status: Completed vaccines  Qualifies for Shingles Vaccine? Yes   Zostavax completed No   Shingrix Completed?: No.    Education has been provided regarding the importance of this vaccine. Patient has been advised to call insurance  company to determine out of pocket expense if they have not yet received this vaccine. Advised may also receive vaccine at local pharmacy or Health Dept. Verbalized acceptance and understanding.  Screening Tests Health Maintenance  Topic Date Due   OPHTHALMOLOGY EXAM  Never done   DTaP/Tdap/Td (1 - Tdap) Never done   Zoster Vaccines- Shingrix (1 of 2) Never done   PAP SMEAR-Modifier  Never done   COVID-19 Vaccine (3 - Pfizer risk series) 09/15/2020   HEMOGLOBIN A1C  12/14/2022   MAMMOGRAM  10/21/2023 (Originally 07/01/2008)   Fecal DNA (Cologuard)  10/21/2023 (Originally 07/02/2003)   Diabetic kidney evaluation - eGFR measurement  06/14/2023   Diabetic kidney evaluation - Urine ACR  06/14/2023   FOOT EXAM  06/14/2023   INFLUENZA VACCINE  06/15/2023   Medicare Annual Wellness (AWV)  03/27/2024   Hepatitis C Screening  Completed   HIV Screening  Completed   HPV VACCINES  Aged Out    Health Maintenance  Health Maintenance Due  Topic Date Due   OPHTHALMOLOGY EXAM  Never done   DTaP/Tdap/Td (1 - Tdap) Never done   Zoster Vaccines- Shingrix (1 of 2) Never done   PAP SMEAR-Modifier  Never done   COVID-19 Vaccine (3 - Pfizer risk series) 09/15/2020  HEMOGLOBIN A1C  12/14/2022    Colorectal cancer screening: Referral to GI placed declined . Pt aware the office will call re: appt.  Mammogram status: Ordered declined . Pt provided with contact info and advised to call to schedule appt.   Bone Density status: Ordered not of age . Pt provided with contact info and advised to call to schedule appt.  Lung Cancer Screening: (Low Dose CT Chest recommended if Age 42-80 years, 30 pack-year currently smoking OR have quit w/in 15years.) does not qualify.   Lung Cancer Screening Referral: n/a  Additional Screening:  Hepatitis C Screening: does not qualify; Completed 04/01/2021  Vision Screening: Recommended annual ophthalmology exams for early detection of glaucoma and other disorders of  the eye. Is the patient up to date with their annual eye exam?  No  Who is the provider or what is the name of the office in which the patient attends annual eye exams? None referral 03/28/2023 If pt is not established with a provider, would they like to be referred to a provider to establish care? No .   Dental Screening: Recommended annual dental exams for proper oral hygiene  Community Resource Referral / Chronic Care Management: CRR required this visit?  No   CCM required this visit?  No      Plan:     I have personally reviewed and noted the following in the patient's chart:   Medical and social history Use of alcohol, tobacco or illicit drugs  Current medications and supplements including opioid prescriptions. Patient is not currently taking opioid prescriptions. Functional ability and status Nutritional status Physical activity Advanced directives List of other physicians Hospitalizations, surgeries, and ER visits in previous 12 months Vitals Screenings to include cognitive, depression, and falls Referrals and appointments  In addition, I have reviewed and discussed with patient certain preventive protocols, quality metrics, and best practice recommendations. A written personalized care plan for preventive services as well as general preventive health recommendations were provided to patient.     Lorrene Reid, LPN   12/28/863   Nurse Notes: Due Tdap /Pneumonia vaccine

## 2023-04-07 ENCOUNTER — Ambulatory Visit (INDEPENDENT_AMBULATORY_CARE_PROVIDER_SITE_OTHER): Payer: Medicare Other | Admitting: Family

## 2023-04-07 ENCOUNTER — Encounter: Payer: Self-pay | Admitting: Family

## 2023-04-07 VITALS — BP 122/77 | HR 87 | Temp 97.9°F | Ht 67.0 in | Wt 285.2 lb

## 2023-04-07 DIAGNOSIS — M159 Polyosteoarthritis, unspecified: Secondary | ICD-10-CM

## 2023-04-07 DIAGNOSIS — M5441 Lumbago with sciatica, right side: Secondary | ICD-10-CM

## 2023-04-07 DIAGNOSIS — Z6841 Body Mass Index (BMI) 40.0 and over, adult: Secondary | ICD-10-CM

## 2023-04-07 DIAGNOSIS — M5442 Lumbago with sciatica, left side: Secondary | ICD-10-CM

## 2023-04-07 DIAGNOSIS — G8929 Other chronic pain: Secondary | ICD-10-CM

## 2023-04-07 DIAGNOSIS — F3341 Major depressive disorder, recurrent, in partial remission: Secondary | ICD-10-CM

## 2023-04-07 DIAGNOSIS — E1169 Type 2 diabetes mellitus with other specified complication: Secondary | ICD-10-CM

## 2023-04-07 DIAGNOSIS — Z794 Long term (current) use of insulin: Secondary | ICD-10-CM

## 2023-04-07 DIAGNOSIS — Z91199 Patient's noncompliance with other medical treatment and regimen due to unspecified reason: Secondary | ICD-10-CM | POA: Insufficient documentation

## 2023-04-07 DIAGNOSIS — E785 Hyperlipidemia, unspecified: Secondary | ICD-10-CM

## 2023-04-07 DIAGNOSIS — I872 Venous insufficiency (chronic) (peripheral): Secondary | ICD-10-CM | POA: Insufficient documentation

## 2023-04-07 DIAGNOSIS — I10 Essential (primary) hypertension: Secondary | ICD-10-CM

## 2023-04-07 DIAGNOSIS — E1151 Type 2 diabetes mellitus with diabetic peripheral angiopathy without gangrene: Secondary | ICD-10-CM

## 2023-04-07 DIAGNOSIS — E039 Hypothyroidism, unspecified: Secondary | ICD-10-CM

## 2023-04-07 DIAGNOSIS — M797 Fibromyalgia: Secondary | ICD-10-CM

## 2023-04-07 DIAGNOSIS — Z8659 Personal history of other mental and behavioral disorders: Secondary | ICD-10-CM

## 2023-04-07 DIAGNOSIS — I739 Peripheral vascular disease, unspecified: Secondary | ICD-10-CM | POA: Insufficient documentation

## 2023-04-07 MED ORDER — BENAZEPRIL HCL 40 MG PO TABS
ORAL_TABLET | ORAL | 3 refills | Status: DC
Start: 2023-04-07 — End: 2023-12-08

## 2023-04-07 MED ORDER — DAPAGLIFLOZIN PROPANEDIOL 10 MG PO TABS
10.0000 mg | ORAL_TABLET | Freq: Every day | ORAL | 1 refills | Status: DC
Start: 2023-04-07 — End: 2023-05-08

## 2023-04-07 MED ORDER — DULOXETINE HCL 30 MG PO CPEP: 30.0000 mg | ORAL_CAPSULE | Freq: Every day | ORAL | 3 refills | Status: DC

## 2023-04-07 MED ORDER — HYDROCHLOROTHIAZIDE 25 MG PO TABS
ORAL_TABLET | ORAL | 0 refills | Status: DC
Start: 2023-04-07 — End: 2023-09-05

## 2023-04-07 MED ORDER — TOUJEO MAX SOLOSTAR 300 UNIT/ML ~~LOC~~ SOPN
18.0000 [IU] | PEN_INJECTOR | Freq: Every day | SUBCUTANEOUS | 1 refills | Status: DC
Start: 2023-04-07 — End: 2023-04-12

## 2023-04-07 MED ORDER — AMLODIPINE BESYLATE 10 MG PO TABS
ORAL_TABLET | ORAL | 3 refills | Status: DC
Start: 2023-04-07 — End: 2023-12-08

## 2023-04-07 NOTE — Patient Instructions (Signed)

## 2023-04-07 NOTE — Progress Notes (Signed)
Subjective:    Patient ID: Cynthia Espinoza, female    DOB: Sep 01, 1956, 67 y.o.   MRN: [id number]  Chief Complaint  Patient presents with   Medical Management of Chronic Issues   Pt presents to the office today for chronic follow up. She is morbid obese with a BMI of 44.  Hypertension This is a chronic problem. The current episode started more than 1 year ago. The problem has been resolved since onset. The problem is controlled. Pertinent negatives include no blurred vision, malaise/fatigue, peripheral edema or shortness of breath. Risk factors for coronary artery disease include dyslipidemia, diabetes mellitus, obesity and sedentary lifestyle. The current treatment provides moderate improvement. Identifiable causes of hypertension include a thyroid problem.  Thyroid Problem Presents for follow-up visit. Patient reports no anxiety, diaphoresis or fatigue. The symptoms have been stable. Her past medical history is significant for hyperlipidemia.  Hyperlipidemia This is a chronic problem. The current episode started more than 1 year ago. The problem is uncontrolled. Recent lipid tests were reviewed and are high. Exacerbating diseases include obesity. Pertinent negatives include no shortness of breath. Current antihyperlipidemic treatment includes diet change. The current treatment provides mild improvement of lipids. Risk factors for coronary artery disease include dyslipidemia, diabetes mellitus, hypertension and a sedentary lifestyle.  Diabetes She presents for her follow-up diabetic visit. She has type 2 diabetes mellitus. Pertinent negatives for hypoglycemia include no nervousness/anxiousness. Pertinent negatives for diabetes include no blurred vision, no fatigue and no foot paresthesias. Symptoms are stable. Risk factors for coronary artery disease include dyslipidemia, diabetes mellitus, hypertension, sedentary lifestyle and obesity. She is following a generally unhealthy diet. Her overall blood  glucose range is 140-180 mg/dl. Eye exam is not current.  Back Pain This is a chronic problem. The current episode started more than 1 year ago. The problem occurs intermittently. The problem has been waxing and waning since onset. The pain is present in the lumbar spine. The quality of the pain is described as aching. The pain is at a severity of 5/10. The pain is moderate. She has tried bed rest for the symptoms. The treatment provided moderate relief.  Arthritis Presents for follow-up visit. She complains of pain and stiffness. The symptoms have been stable. Affected locations include the left knee, right knee, left MCP and right MCP. Her pain is at a severity of 6/10. Pertinent negatives include no fatigue.  Depression        This is a chronic problem.  The current episode started more than 1 year ago.   The problem occurs intermittently.  Associated symptoms include sad.  Associated symptoms include no fatigue, no helplessness and no hopelessness.  Past treatments include SNRIs - Serotonin and norepinephrine reuptake inhibitors.  Past medical history includes thyroid problem.       Review of Systems  Constitutional:  Negative for diaphoresis, fatigue and malaise/fatigue.  Eyes:  Negative for blurred vision.  Respiratory:  Negative for shortness of breath.   Musculoskeletal:  Positive for arthritis, back pain and stiffness.  Psychiatric/Behavioral:  Positive for depression. The patient is not nervous/anxious.   All other systems reviewed and are negative.      Objective:   Physical Exam Vitals reviewed.  Constitutional:      General: She is not in acute distress.    Appearance: She is well-developed. She is obese.  HENT:     Head: Normocephalic and atraumatic.     Right Ear: Tympanic membrane normal.     Left Ear: Tympanic  membrane normal.  Eyes:     Pupils: Pupils are equal, round, and reactive to light.  Neck:     Thyroid: No thyromegaly.  Cardiovascular:     Rate and  Rhythm: Normal rate and regular rhythm.     Heart sounds: Normal heart sounds. No murmur heard. Pulmonary:     Effort: Pulmonary effort is normal. No respiratory distress.     Breath sounds: Normal breath sounds. No wheezing.  Abdominal:     General: Bowel sounds are normal. There is no distension.     Palpations: Abdomen is soft.     Tenderness: There is no abdominal tenderness.  Musculoskeletal:        General: No tenderness. Normal range of motion.     Cervical back: Normal range of motion and neck supple.     Right lower leg: Edema (trace) present.     Left lower leg: Edema (trace) present.  Skin:    General: Skin is warm and dry.     Comments: Discoloration of bilateral legs  Neurological:     Mental Status: She is alert and oriented to person, place, and time.     Cranial Nerves: No cranial nerve deficit.     Deep Tendon Reflexes: Reflexes are normal and symmetric.  Psychiatric:        Behavior: Behavior normal.        Thought Content: Thought content normal.        Judgment: Judgment normal.          BP 122/77   Pulse 87   Temp 97.9 F (36.6 C) (Temporal)   Ht 5\' 7"  (1.702 m)   Wt 285 lb 3.2 oz (129.4 kg)   SpO2 93%   BMI 44.67 kg/m   Assessment & Plan:  Cynthia Espinoza comes in today with chief complaint of Medical Management of Chronic Issues   Diagnosis and orders addressed:  1. Essential hypertension - amLODipine (NORVASC) 10 MG tablet; TAKE ONE (1) TABLET EACH DAY (NEEDS TO BE SEEN BEFORE NEXT REFILL)  Dispense: 90 tablet; Refill: 3 - benazepril (LOTENSIN) 40 MG tablet; TAKE ONE (1) TABLET EACH DAY (NEEDS TO BE SEEN BEFORE NEXT REFILL)  Dispense: 90 tablet; Refill: 3 - hydrochlorothiazide (HYDRODIURIL) 25 MG tablet; TAKE ONE (1) TABLET EACH DAY  Dispense: 90 tablet; Refill: 0 - CMP14+EGFR - CBC with Differential/Platelet  2. Fibromyalgia - DULoxetine (CYMBALTA) 30 MG capsule; Take 1 capsule (30 mg total) by mouth daily.  Dispense: 90 capsule; Refill:  3 - CMP14+EGFR - CBC with Differential/Platelet  3. Type 2 diabetes mellitus with other specified complication, with long-term current use of insulin (HCC) - dapagliflozin propanediol (FARXIGA) 10 MG TABS tablet; Take 1 tablet (10 mg total) by mouth daily before breakfast.  Dispense: 90 tablet; Refill: 1 - CMP14+EGFR - CBC with Differential/Platelet - insulin glargine, 2 Unit Dial, (TOUJEO MAX SOLOSTAR) 300 UNIT/ML Solostar Pen; Inject 18 Units into the skin daily.  Dispense: 3 mL; Refill: 1  4. Recurrent major depressive disorder, in partial remission (HCC) - CMP14+EGFR - CBC with Differential/Platelet  5. Morbid obesity (HCC) - CMP14+EGFR - CBC with Differential/Platelet  6. Hypothyroidism, unspecified type - CMP14+EGFR - CBC with Differential/Platelet - TSH  7. Hyperlipidemia associated with type 2 diabetes mellitus (HCC) - CMP14+EGFR - CBC with Differential/Platelet - Lipid panel  8. History of posttraumatic stress disorder (PTSD) - CMP14+EGFR - CBC with Differential/Platelet  9. Generalized OA - CMP14+EGFR - CBC with Differential/Platelet  10. Chronic bilateral low back  pain with bilateral sciatica - CMP14+EGFR - CBC with Differential/Platelet  11. Noncompliance - CMP14+EGFR - CBC with Differential/Platelet  12. PAD (peripheral artery disease) (HCC)  Start farxiga 10 mg  Stop 70-30 and start Toujeo 18 units Strict low carb diet  Labs pending Health Maintenance reviewed Diet and exercise encouraged  Follow up plan: 1 month to follow up on uncontrolled DM   Jannifer Rodney, FNP

## 2023-04-08 LAB — CMP14+EGFR
ALT: 38 IU/L — ABNORMAL HIGH (ref 0–32)
AST: 41 IU/L — ABNORMAL HIGH (ref 0–40)
Albumin/Globulin Ratio: 1.6 (ref 1.2–2.2)
Albumin: 4.5 g/dL (ref 3.9–4.9)
Alkaline Phosphatase: 64 IU/L (ref 44–121)
BUN/Creatinine Ratio: 19 (ref 12–28)
BUN: 12 mg/dL (ref 8–27)
Bilirubin Total: 0.5 mg/dL (ref 0.0–1.2)
CO2: 20 mmol/L (ref 20–29)
Calcium: 9.4 mg/dL (ref 8.7–10.3)
Chloride: 97 mmol/L (ref 96–106)
Creatinine, Ser: 0.63 mg/dL (ref 0.57–1.00)
Globulin, Total: 2.9 g/dL (ref 1.5–4.5)
Glucose: 231 mg/dL — ABNORMAL HIGH (ref 70–99)
Potassium: 4 mmol/L (ref 3.5–5.2)
Sodium: 135 mmol/L (ref 134–144)
Total Protein: 7.4 g/dL (ref 6.0–8.5)
eGFR: 99 mL/min/{1.73_m2} (ref >59)

## 2023-04-08 LAB — CBC WITH DIFFERENTIAL/PLATELET
Basophils Absolute: 0 10*3/uL (ref 0.0–0.2)
Basos: 0 %
EOS (ABSOLUTE): 0.2 10*3/uL (ref 0.0–0.4)
Eos: 2 %
Hematocrit: 42.7 % (ref 34.0–46.6)
Hemoglobin: 14.4 g/dL (ref 11.1–15.9)
Immature Grans (Abs): 0 10*3/uL (ref 0.0–0.1)
Immature Granulocytes: 0 %
Lymphocytes Absolute: 2.8 10*3/uL (ref 0.7–3.1)
Lymphs: 31 %
MCH: 31 pg (ref 26.6–33.0)
MCHC: 33.7 g/dL (ref 31.5–35.7)
MCV: 92 fL (ref 79–97)
Monocytes Absolute: 0.8 10*3/uL (ref 0.1–0.9)
Monocytes: 9 %
Neutrophils Absolute: 5.2 10*3/uL (ref 1.4–7.0)
Neutrophils: 58 %
Platelets: 236 10*3/uL (ref 150–450)
RBC: 4.64 x10E6/uL (ref 3.77–5.28)
RDW: 12.2 % (ref 11.7–15.4)
WBC: 9 10*3/uL (ref 3.4–10.8)

## 2023-04-08 LAB — LIPID PANEL
Chol/HDL Ratio: 4.3 ratio (ref 0.0–4.4)
Cholesterol, Total: 197 mg/dL (ref 100–199)
HDL: 46 mg/dL (ref >39)
LDL Chol Calc (NIH): 112 mg/dL — ABNORMAL HIGH (ref 0–99)
Triglycerides: 225 mg/dL — ABNORMAL HIGH (ref 0–149)
VLDL Cholesterol Cal: 39 mg/dL (ref 5–40)

## 2023-04-08 LAB — TSH: TSH: 2.97 u[IU]/mL (ref 0.450–4.500)

## 2023-04-12 ENCOUNTER — Other Ambulatory Visit: Payer: Self-pay

## 2023-04-12 ENCOUNTER — Telehealth: Payer: Self-pay | Admitting: Family

## 2023-04-12 DIAGNOSIS — Z794 Long term (current) use of insulin: Secondary | ICD-10-CM

## 2023-04-12 MED ORDER — TOUJEO SOLOSTAR 300 UNIT/ML ~~LOC~~ SOPN: 18.0000 [IU] | PEN_INJECTOR | Freq: Every day | SUBCUTANEOUS | 3 refills | Status: DC

## 2023-04-12 NOTE — Telephone Encounter (Signed)
[facility] and [facility] rx so patient is able to get. Patient notified and verbalized understanding

## 2023-04-12 NOTE — Telephone Encounter (Signed)
Pt called stating that there is an issue with Arlys John at the drug store being able to fill her Insulin. Needs nurse to call him and get the issue fixed so that he can fill her Rx.

## 2023-04-17 LAB — HGB A1C W/O EAG: Hgb A1c MFr Bld: 13.4 % — ABNORMAL HIGH (ref 4.8–5.6)

## 2023-04-17 LAB — SPECIMEN STATUS REPORT

## 2023-04-18 ENCOUNTER — Other Ambulatory Visit: Payer: Self-pay | Admitting: Family

## 2023-04-18 MED ORDER — TOUJEO SOLOSTAR 300 UNIT/ML ~~LOC~~ SOPN: 25.0000 [IU] | PEN_INJECTOR | Freq: Every day | SUBCUTANEOUS | 3 refills | Status: DC

## 2023-05-04 NOTE — Patient Instructions (Addendum)
Our records indicate that you are due for your annual mammogram/breast imaging. While there is no way to prevent breast cancer, early detection provides the best opportunity for curing it. For women over the age of 52, the American Cancer Society recommends a yearly clinical breast exam and a yearly mammogram. These practices have saved thousands of lives. We need your help to ensure that you are receiving optimal medical care. Please call the imaging location that has done you previous mammograms. Please remember to list [place] as your primary care. This helps make sure we receive a report and can update your chart.  Below is the contact information for several local breast imaging centers. You may call the location that works best for you, and they will be happy to assistance in making you an appointment. You do not need an order for a regular screening mammogram. However, if you are having any problems or concerns with you breast area, please let your primary care provider know, and appropriate orders will be placed. Please let our office know if you have any questions or concerns. Or if you need information for another imaging center not on this list or outside of the area. We are commented to working with you on your health care journey.   The mobile unit/bus ([facility]) - they come twice a month to our location.  These appointments can be made through our office or by call The Breast Center  [facility]  [address] [place], [place] [address] Phone [phone]  [facility] Radiology Department  [address] [place], [place] [address] [phone]  [facility] [place] [facility])  [phone] [address] [place], [place] [address] [phone]  [facility]  [address] [place] [address] [phone]  [facility]  [address] [place] [address] [phone]  [facility] in [place]  [address] [place], [place] [address] [phone]  [facility] [address] [place], [place] [address] [phone]  [facility] [address] [place], [place] [address] [phone]  Cherene Julian [facility] [address] [place], [place] [address] [phone]   Diabetes Mellitus and Nutrition, Adult When you have diabetes, or diabetes mellitus, it is very important to have healthy eating habits because your blood sugar (glucose) levels are greatly affected by what you eat and drink. Eating healthy foods in the right amounts, at about the same times every day, can help you: Manage your blood glucose. Lower your risk of heart disease. Improve your blood pressure. Reach or maintain a healthy weight. What can affect my meal plan? Every person with diabetes is different, and each person has different needs for a meal plan. Your health care provider may recommend that you work with a dietitian to make a meal plan that is best for you. Your meal plan may vary depending on factors such as: The calories you need. The medicines you take. Your weight. Your blood glucose, blood pressure, and cholesterol levels. Your activity level. Other health conditions you have, such as heart or kidney disease. How do carbohydrates affect me? Carbohydrates, also called carbs, affect your blood glucose level more than any other type of food. Eating carbs  raises the amount of glucose in your blood. It is important to know how many carbs you can safely have in each meal. This is different for every person. Your dietitian can help you calculate how many carbs you should have at each meal and for each snack. How does alcohol affect me? Alcohol can cause a decrease in blood glucose  (hypoglycemia), especially if you use insulin or take certain diabetes medicines by mouth. Hypoglycemia can be a life-threatening condition. Symptoms of hypoglycemia, such as sleepiness, dizziness, and confusion, are similar to symptoms of having too much alcohol. Do not drink alcohol if: Your health care provider tells you not to drink. You are pregnant, may be pregnant, or are planning to become pregnant. If you drink alcohol: Limit how much you have to: 0-1 drink a day for women. 0-2 drinks a day for men. Know how much alcohol is in your drink. In the [place] one drink equals one 12 oz bottle of beer (355 mL), one 5 oz glass of wine (148 mL), or one 1 oz glass of hard liquor (44 mL). Keep yourself hydrated with water, diet soda, or unsweetened iced tea. Keep in mind that regular soda, juice, and other mixers may contain a lot of sugar and must be counted as carbs. What are tips for following this plan?  Reading food labels Start by checking the serving size on the Nutrition Facts label of packaged foods and drinks. The number of calories and the amount of carbs, fats, and other nutrients listed on the label are based on one serving of the item. Many items contain more than one serving per package. Check the total grams (g) of carbs in one serving. Check the number of grams of saturated fats and trans fats in one serving. Choose foods that have a low amount or none of these fats. Check the number of milligrams (mg) of salt (sodium) in one serving. Most people should limit total sodium intake to less than 2,300 mg per day. Always check the nutrition information of foods labeled as "low-fat" or "nonfat." These foods may be higher in added sugar or refined carbs and should be avoided. Talk to your dietitian to identify your daily goals for nutrients listed on the label. Shopping Avoid buying canned, pre-made, or processed foods. These foods tend to be high in fat, sodium, and added sugar. Shop  around the outside edge of the grocery store. This is where you will most often find fresh fruits and vegetables, bulk grains, fresh meats, and fresh dairy products. Cooking Use low-heat cooking methods, such as baking, instead of high-heat cooking methods, such as deep frying. Cook using healthy oils, such as olive, canola, or sunflower oil. Avoid cooking with butter, cream, or high-fat meats. Meal planning Eat meals and snacks regularly, preferably at the same times every day. Avoid going long periods of time without eating. Eat foods that are high in fiber, such as fresh fruits, vegetables, beans, and whole grains. Eat 4-6 oz (112-168 g) of lean protein each day, such as lean meat, chicken, fish, eggs, or tofu. One ounce (oz) (28 g) of lean protein is equal to: 1 oz (28 g) of meat, chicken, or fish. 1 egg.  cup (62 g) of tofu. Eat some foods each day that contain healthy fats, such as avocado, nuts, seeds, and fish. What foods should I eat? Fruits Berries. Apples. Oranges. Peaches. Apricots. Plums. Grapes. Mangoes. Papayas. Pomegranates. Kiwi. Cherries. Vegetables Leafy greens, including lettuce, spinach, kale, chard, collard greens,  mustard greens, and cabbage. Beets. Cauliflower. Broccoli. Carrots. Green beans. Tomatoes. Peppers. Onions. Cucumbers. Brussels sprouts. Grains Whole grains, such as whole-wheat or whole-grain bread, crackers, tortillas, cereal, and pasta. Unsweetened oatmeal. Quinoa. Brown or wild rice. Meats and other proteins Seafood. Poultry without skin. Lean cuts of poultry and beef. Tofu. Nuts. Seeds. Dairy Low-fat or fat-free dairy products such as milk, yogurt, and cheese. The items listed above may not be a complete list of foods and beverages you can eat and drink. Contact a dietitian for more information. What foods should I avoid? Fruits Fruits canned with syrup. Vegetables Canned vegetables. Frozen vegetables with butter or cream sauce. Grains Refined  white flour and flour products such as bread, pasta, snack foods, and cereals. Avoid all processed foods. Meats and other proteins Fatty cuts of meat. Poultry with skin. Breaded or fried meats. Processed meat. Avoid saturated fats. Dairy Full-fat yogurt, cheese, or milk. Beverages Sweetened drinks, such as soda or iced tea. The items listed above may not be a complete list of foods and beverages you should avoid. Contact a dietitian for more information. Questions to ask a health care provider Do I need to meet with a certified diabetes care and education specialist? Do I need to meet with a dietitian? What number can I call if I have questions? When are the best times to check my blood glucose? Where to find more information: American Diabetes Association: diabetes.org Academy of Nutrition and Dietetics: eatright.[employer] of Diabetes and Digestive and Kidney Diseases: StageSync.si Association of Diabetes Care & Education Specialists: diabeteseducator.org Summary It is important to have healthy eating habits because your blood sugar (glucose) levels are greatly affected by what you eat and drink. It is important to use alcohol carefully. A healthy meal plan will help you manage your blood glucose and lower your risk of heart disease. Your health care provider may recommend that you work with a dietitian to make a meal plan that is best for you. This information is not intended to replace advice given to you by your health care provider. Make sure you discuss any questions you have with your health care provider. Document Revised: 06/03/2020 Document Reviewed: 06/03/2020 Elsevier Patient Education  2024 [employer].

## 2023-05-08 ENCOUNTER — Encounter: Payer: Self-pay | Admitting: Family

## 2023-05-08 ENCOUNTER — Ambulatory Visit (INDEPENDENT_AMBULATORY_CARE_PROVIDER_SITE_OTHER): Payer: Medicare HMO | Admitting: Family

## 2023-05-08 VITALS — BP 122/70 | HR 79 | Temp 97.9°F | Ht 67.0 in | Wt 289.0 lb

## 2023-05-08 DIAGNOSIS — I10 Essential (primary) hypertension: Secondary | ICD-10-CM

## 2023-05-08 DIAGNOSIS — E1169 Type 2 diabetes mellitus with other specified complication: Secondary | ICD-10-CM

## 2023-05-08 DIAGNOSIS — Z91199 Patient's noncompliance with other medical treatment and regimen due to unspecified reason: Secondary | ICD-10-CM | POA: Diagnosis not present

## 2023-05-08 DIAGNOSIS — I739 Peripheral vascular disease, unspecified: Secondary | ICD-10-CM | POA: Diagnosis not present

## 2023-05-08 DIAGNOSIS — M25511 Pain in right shoulder: Secondary | ICD-10-CM | POA: Diagnosis not present

## 2023-05-08 DIAGNOSIS — Z794 Long term (current) use of insulin: Secondary | ICD-10-CM

## 2023-05-08 MED ORDER — DAPAGLIFLOZIN PROPANEDIOL 5 MG PO TABS
5.0000 mg | ORAL_TABLET | Freq: Every day | ORAL | 1 refills | Status: DC
Start: 2023-05-08 — End: 2023-11-09

## 2023-05-08 MED ORDER — TOUJEO SOLOSTAR 300 UNIT/ML ~~LOC~~ SOPN: 30.0000 [IU] | PEN_INJECTOR | Freq: Every day | SUBCUTANEOUS | 3 refills | Status: DC

## 2023-05-08 NOTE — Progress Notes (Signed)
Subjective:    Patient ID: AMIAYAH GIEBEL, female    DOB: 16-Apr-1956, 67 y.o.   MRN: [id number]  Chief Complaint  Patient presents with   Diabetes    1 month follow up    Pt presents to the office today for follow up on uncontrolled DM. Pt's A1C was 13.4 on 04/07/23. We added Farxiga 10 mg and Toujeo 25 units. States she has only taken the [place] a few times because of palpitations.  Diabetes She presents for her follow-up diabetic visit. She has type 2 diabetes mellitus. Pertinent negatives for diabetes include no blurred vision and no foot paresthesias. Symptoms are stable. Risk factors for coronary artery disease include dyslipidemia, diabetes mellitus, hypertension and sedentary lifestyle. She is following a generally unhealthy diet. Her overall blood glucose range is 140-180 mg/dl.  Hypertension This is a chronic problem. The current episode started more than 1 year ago. The problem has been waxing and waning since onset. The problem is uncontrolled. Pertinent negatives include no blurred vision, malaise/fatigue, peripheral edema or shortness of breath. The current treatment provides moderate improvement.  Shoulder Pain  The pain is present in the right shoulder. This is a new problem. The current episode started more than 1 month ago. There has been a history of trauma. The problem has been waxing and waning. The pain is at a severity of 4/10. The pain is mild. The treatment provided mild relief.      Review of Systems  Constitutional:  Negative for malaise/fatigue.  Eyes:  Negative for blurred vision.  Respiratory:  Negative for shortness of breath.        Objective:   Physical Exam Vitals reviewed.  Constitutional:      General: She is not in acute distress.    Appearance: She is well-developed. She is obese.  HENT:     Head: Normocephalic and atraumatic.     Right Ear: Tympanic membrane normal.     Left Ear: Tympanic membrane normal.  Eyes:     Pupils: Pupils are  equal, round, and reactive to light.  Neck:     Thyroid: No thyromegaly.  Cardiovascular:     Rate and Rhythm: Normal rate and regular rhythm.     Heart sounds: Normal heart sounds. No murmur heard. Pulmonary:     Effort: Pulmonary effort is normal. No respiratory distress.     Breath sounds: Normal breath sounds. No stridor. No wheezing.  Abdominal:     General: Bowel sounds are normal. There is no distension.     Palpations: Abdomen is soft.     Tenderness: There is no abdominal tenderness.  Musculoskeletal:        General: Tenderness present.     Cervical back: Normal range of motion and neck supple.     Right lower leg: Edema (trace) present.     Left lower leg: Edema (trace) present.     Comments: Pain in right shoulder with abduction  Skin:    General: Skin is warm and dry.     Comments: Bilateral discoloration of lower legs   Neurological:     Mental Status: She is alert and oriented to person, place, and time.     Cranial Nerves: No cranial nerve deficit.     Deep Tendon Reflexes: Reflexes are normal and symmetric.  Psychiatric:        Behavior: Behavior normal.        Thought Content: Thought content normal.  Judgment: Judgment normal.       BP (!) 142/84   Pulse 79   Temp 97.9 F (36.6 C) (Temporal)   Ht 5\' 7"  (1.702 m)   Wt 289 lb (131.1 kg)   SpO2 94%   BMI 45.26 kg/m      Assessment & Plan:   HADASSA CERMAK comes in today with chief complaint of Diabetes (1 month follow up )   Diagnosis and orders addressed:  1. Type 2 diabetes mellitus with other specified complication, with long-term current use of insulin (HCC) - Will decrease Farxiga 5 mg  Increase Toujeo to 30 units from 25 units Referral to Chronic Care management  Strict low carb diet  - dapagliflozin propanediol (FARXIGA) 5 MG TABS tablet; Take 1 tablet (5 mg total) by mouth daily before breakfast.  Dispense: 90 tablet; Refill: 1 - insulin glargine, 1 Unit Dial, (TOUJEO SOLOSTAR)  300 UNIT/ML Solostar Pen; Inject 30 Units into the skin daily.  Dispense: 4.5 mL; Refill: 3 - AMB Referral to Chronic Care Management Services - CMP14+EGFR  2. Essential hypertension - AMB Referral to Chronic Care Management Services - CMP14+EGFR  3. Noncompliance - CMP14+EGFR  4. PAD (peripheral artery disease) (HCC)  5. Acute pain of right shoulder Rest Ice  ROM exercises  - Ambulatory referral to Physical Therapy   Labs pending Health Maintenance reviewed Diet and exercise encouraged  Follow up plan: 1 month    Jannifer Rodney, FNP

## 2023-05-09 LAB — CMP14+EGFR
ALT: 39 IU/L — ABNORMAL HIGH (ref 0–32)
AST: 28 IU/L (ref 0–40)
Albumin: 4.3 g/dL (ref 3.9–4.9)
Alkaline Phosphatase: 60 IU/L (ref 44–121)
BUN/Creatinine Ratio: 21 (ref 12–28)
BUN: 13 mg/dL (ref 8–27)
Bilirubin Total: 0.4 mg/dL (ref 0.0–1.2)
CO2: 23 mmol/L (ref 20–29)
Calcium: 9.6 mg/dL (ref 8.7–10.3)
Chloride: 96 mmol/L (ref 96–106)
Creatinine, Ser: 0.62 mg/dL (ref 0.57–1.00)
Globulin, Total: 3.1 g/dL (ref 1.5–4.5)
Glucose: 258 mg/dL — ABNORMAL HIGH (ref 70–99)
Potassium: 4.3 mmol/L (ref 3.5–5.2)
Sodium: 137 mmol/L (ref 134–144)
Total Protein: 7.4 g/dL (ref 6.0–8.5)
eGFR: 99 mL/min/{1.73_m2} (ref >59)

## 2023-05-12 ENCOUNTER — Telehealth: Payer: Self-pay

## 2023-05-12 NOTE — Progress Notes (Signed)
   Care Guide Note  05/12/2023 Name: MAKYRA LEIDING MRN: [id number] DOB: 11/27/1955  Referred by: Junie Spencer, FNP Reason for referral : Care Coordination (Outreach to schedule with pharm d )   DIVISHA ANTALEK is a 67 y.o. year old female who is a primary care patient of Junie Spencer, FNP. NAVAYA WEISHEIT was referred to the pharmacist for assistance related to HTN and DM.    An unsuccessful telephone outreach was attempted today to contact the patient who was referred to the pharmacy team for assistance with medication management. Additional attempts will be made to contact the patient.   [name], RMA Care Guide [facility]  [place], [place] [address] Direct Dial: [phone] [email or web address]

## 2023-05-16 NOTE — Progress Notes (Signed)
   Care Guide Note  05/16/2023 Name: Cynthia Espinoza MRN: [id number] DOB: 1956-03-27  Referred by: Junie Spencer, FNP Reason for referral : Care Coordination (Outreach to schedule with pharm d )   Cynthia Espinoza is a 67 y.o. year old female who is a primary care patient of Junie Spencer, FNP. [name] was referred to the pharmacist for assistance related to DM.    A second unsuccessful telephone outreach was attempted today to contact the patient who was referred to the pharmacy team for assistance with medication management. Additional attempts will be made to contact the patient.  [name], RMA Care Guide [facility]  [place], [place] [address] Direct Dial: [phone] [name]

## 2023-05-23 ENCOUNTER — Ambulatory Visit: Payer: Medicare HMO | Attending: Family

## 2023-05-23 ENCOUNTER — Other Ambulatory Visit: Payer: Self-pay

## 2023-05-23 DIAGNOSIS — M25511 Pain in right shoulder: Secondary | ICD-10-CM | POA: Diagnosis not present

## 2023-05-23 NOTE — Therapy (Signed)
OUTPATIENT PHYSICAL THERAPY SHOULDER EVALUATION   Patient Name: Cynthia Espinoza MRN: [id number] DOB:[birth year], 67 y.o., female Today's Date: 05/23/2023  END OF SESSION:  PT End of Session - 05/23/23 1517     Visit Number 1    Number of Visits 1    Date for PT Re-Evaluation 06/09/23    PT Start Time 1518    PT Stop Time 1600    PT Time Calculation (min) 42 min    Activity Tolerance Patient tolerated treatment well    Behavior During Therapy [facility] for tasks assessed/performed             Past Medical History:  Diagnosis Date   Anxiety    Depression    Hypertension    Thyroid disease    Past Surgical History:  Procedure Laterality Date   CESAREAN SECTION     Patient Active Problem List   Diagnosis Date Noted   Noncompliance 04/07/2023   PAD (peripheral artery disease) (HCC) 04/07/2023   Hyperlipidemia associated with type 2 diabetes mellitus (HCC) 06/13/2022   DKA (diabetic ketoacidoses) 04/02/2019   History of posttraumatic stress disorder (PTSD) 10/05/2018   Palpitations 05/12/2017   Premature beat 05/12/2017   Recurrent major depressive disorder, in partial remission (HCC) 01/24/2017   Diabetes mellitus (HCC) 01/10/2017   Essential hypertension 08/30/2016   Hypothyroidism 08/30/2016   Generalized OA 08/30/2016   Fibromyalgia 08/30/2016   Chronic bilateral low back pain with bilateral sciatica 08/30/2016   Chronic pain of both knees 08/30/2016   Pain in both wrists 08/30/2016   Pain in joint involving ankle and foot 08/30/2016   Pain of both shoulder joints 08/30/2016   Morbid obesity (HCC) 08/30/2016   REFERRING PROVIDER: Junie Spencer, FNP   REFERRING DIAG: Acute pain of right shoulder   THERAPY DIAG:  Acute pain of right shoulder  Rationale for Evaluation and Treatment: Rehabilitation  ONSET DATE: May 2024   SUBJECTIVE:                                                                                                                                                                                       SUBJECTIVE STATEMENT: Patient reports that she fell on her right shoulder and hip when she fell at home while gardening around her sidewalk. She noticed that she could not sleep on her shoulder initially, but it doing a lot better now.  Hand dominance: Right  PERTINENT HISTORY: Hypertension, diabetes, osteoarthritis, fibromyalgia, depression, anxiety, and PTSD  PAIN:  Are you having pain? Yes: NPRS scale: 0 but can get up to 3-4/10 Pain location: right shoulder Pain description:  brief "twinge"  Aggravating factors: gardening  a lot (4+ hours)  Relieving factors: topical cream  PRECAUTIONS: None  WEIGHT BEARING RESTRICTIONS: No  FALLS:  Has patient fallen in last 6 months? Yes. Number of falls 1  LIVING ENVIRONMENT: Lives with: lives alone Lives in: House/apartment  OCCUPATION: Disabled and retired  PLOF: Independent  PATIENT GOALS: get rid of the "twinge'   NEXT MD VISIT: none scheduled  OBJECTIVE:   COGNITION: Overall cognitive status: Within functional limits for tasks assessed     SENSATION: Patient reports no numbness or tingling  UPPER EXTREMITY ROM:   Active ROM Right eval Left eval  Shoulder flexion 139 126  Shoulder extension    Shoulder abduction 134 139  Shoulder adduction    Shoulder internal rotation To L1; slight "pulling"  To T11  Shoulder external rotation To T4 To spine of L scapula   Elbow flexion    Elbow extension    Wrist flexion    Wrist extension    Wrist ulnar deviation    Wrist radial deviation    Wrist pronation    Wrist supination    (Blank rows = not tested)  UPPER EXTREMITY MMT:  MMT Right eval Left eval  Shoulder flexion 4/5 4/5  Shoulder extension    Shoulder abduction 4+/5 4+/5  Shoulder adduction    Shoulder internal rotation 4/5 4+/5  Shoulder external rotation 4/5 4/5  Middle trapezius    Lower trapezius    Elbow flexion    Elbow extension    Wrist  flexion    Wrist extension    Wrist ulnar deviation    Wrist radial deviation    Wrist pronation    Wrist supination    Grip strength (lbs)    (Blank rows = not tested)  PALPATION:  TTP: right coracoid process   TODAY'S TREATMENT:                                                                                                                                         DATE:    PATIENT EDUCATION: Education details: Prognosis, objective findings, and healing Person educated: Patient Education method: Explanation Education comprehension: verbalized understanding  ASSESSMENT:  CLINICAL IMPRESSION: Patient is a 67 y.o. female who was seen today for physical therapy evaluation and treatment for right shoulder pain.  However, she presented reporting a significant improvement in her right shoulder symptoms since she saw her referring provider on 05/08/23.  She notes that she is no longer having any significant pain or discomfort with any functional activities.  She exhibited no significant range of motion or strength deficits compared to the left upper extremity.  Recommend that she be discharged from skilled physical therapy at this time.  PHYSICAL THERAPY DISCHARGE SUMMARY  Visits from Start of Care: 1  Current functional level related to goals / functional outcomes: Patient reported feeling comfortable being discharged from skilled physical therapy as she is able  to complete all her daily activities without limitation   Remaining deficits: None   Education / Equipment: Patient was educated on the healing process and her objective findings  Patient agrees to discharge. Patient goals were met. Patient is being discharged due to being pleased with the current functional level.   OBJECTIVE IMPAIRMENTS: decreased activity tolerance.   ACTIVITY LIMITATIONS:  gardening  PARTICIPATION LIMITATIONS: yard work  PERSONAL FACTORS: 3+ comorbidities: Hypertension, diabetes, osteoarthritis,  fibromyalgia, depression, anxiety, and PTSD  are also affecting patient's functional outcome.   REHAB POTENTIAL: Good  CLINICAL DECISION MAKING: Stable/uncomplicated  EVALUATION COMPLEXITY: Low   GOALS: Goals reviewed with patient? No Evaluation only  PLAN:  PT FREQUENCY: one time visit  PT DURATION: 1 week  PLANNED INTERVENTIONS: Therapeutic exercises, Therapeutic activity, Neuromuscular re-education, Patient/Family education, Self Care, Joint mobilization, Manual therapy, and Re-evaluation  PLAN FOR NEXT SESSION: evaluation only   Granville Lewis, PT 05/23/2023, 6:42 PM

## 2023-05-25 ENCOUNTER — Ambulatory Visit: Payer: BC Managed Care – PPO

## 2023-05-25 NOTE — Progress Notes (Signed)
   Care Guide Note  05/25/2023 Name: Cynthia Espinoza MRN: [id number] DOB: [birth year]  Referred by: Junie Spencer, FNP Reason for referral : Care Coordination (Outreach to schedule with pharm d )   Cynthia Espinoza is a 67 y.o. year old female who is a primary care patient of Junie Spencer, FNP. [name] was referred to the pharmacist for assistance related to DM.    Successful contact was made with the patient to discuss pharmacy services including being ready for the pharmacist to call at least 5 minutes before the scheduled appointment time, to have medication bottles and any blood sugar or blood pressure readings ready for review. The patient agreed to meet with the pharmacist via with the pharmacist via telephone visit on (date/time).  06/27/2023  [name], RMA Care Guide [facility]  [place], [place] [address] Direct Dial: [phone] [email or web address]

## 2023-06-08 ENCOUNTER — Other Ambulatory Visit: Payer: Self-pay | Admitting: Family

## 2023-06-08 DIAGNOSIS — E039 Hypothyroidism, unspecified: Secondary | ICD-10-CM

## 2023-06-27 ENCOUNTER — Ambulatory Visit (INDEPENDENT_AMBULATORY_CARE_PROVIDER_SITE_OTHER): Payer: Medicare HMO | Admitting: Pharmacist

## 2023-06-27 DIAGNOSIS — E1169 Type 2 diabetes mellitus with other specified complication: Secondary | ICD-10-CM

## 2023-06-27 DIAGNOSIS — Z794 Long term (current) use of insulin: Secondary | ICD-10-CM

## 2023-06-27 MED ORDER — TOUJEO SOLOSTAR 300 UNIT/ML ~~LOC~~ SOPN: 30.0000 [IU] | PEN_INJECTOR | Freq: Every day | SUBCUTANEOUS | 3 refills | Status: DC

## 2023-06-27 NOTE — Progress Notes (Signed)
06/27/2023 Name: Cynthia Espinoza MRN: [id number] DOB: December 06, 1955  Chief Complaint  Patient presents with   Diabetes    DRENNA THUMA is a 67 y.o. year old female who presented for a telephone visit. I connected with  [name] on 07/04/23 by telephone and verified that I am speaking with the correct person using two identifiers. I discussed the limitations of evaluation and management by telemedicine. The patient expressed understanding and agreed to proceed.  Patient was located in her home and PharmD in PCP office during this visit.   They were referred to the pharmacist by their PCP for assistance in managing diabetes.   Subjective:  Care Team: Primary Care Provider: Junie Spencer, FNP ; Next Scheduled Visit: needs appt scheduled  Medication Access/Adherence  Current Pharmacy:  [facility], [place] - [address] [place] [address] Phone: [phone] Fax: [phone]   Patient reports affordability concerns with their medications: No  Patient reports access/transportation concerns to their pharmacy: No  Patient reports adherence concerns with their medications:  Yes  Stopped statin. Adherent to insulin.   Diabetes:  Taking variable doses of Toujeo - usually 32 units (but ranging from 30-35 units). Current Rx is for 30 units daily. Was hesitant to try once weekly injectable last year - had cousin that had numbness on Ozempic. Is not interested in trying an alternative GLP-1 like Mounjaro or Trulicity - she wants to see how the Toujeo works first. Stopped taking her statin but does not remember why (possible GI issues)- wants to work on lifestyle first.   Current medications: insulin glargine U300 (Toujeo),   Medications tried in the past: dapagliflozin 5/10 mg (had heart palpitations), canagliflozin, glipizide, NPH insulin, metformin (GI upset)  Current glucose readings: this AM 130 mg/dL, highest 132-440 mg/dL Denies BG <10  mg/dL in the AM.  Using BG meter; testing one times daily, fasting. Occasionally will check BG at night if she had dietary excursions. Checked the other week after eating a piece of cake - was 150, 160 (not as high has expecting).   Not interested in CGM - cousin wears one and she doesn't like the alarms or being tracked.  Patient denies hypoglycemic s/sx including dizziness, shakiness, sweating. Patient denies hyperglycemic symptoms including polyuria, polydipsia, polyphagia, nocturia, neuropathy, blurred vision.  Current meal patterns: - Supper: Has been eating more chicken and chopped salads, trying to cut back red meat.  - Drinks: 6-7 bottles water/day. Sometimes adds gatorade to her water or pur leaf sweet tea.   Current physical activity: Attempting to get more steps in  Current medication access support: none- Medicare/Medicaid   Objective:  Lab Results  Component Value Date   HGBA1C 13.4 (H) 04/07/2023    Lab Results  Component Value Date   CREATININE 0.62 05/08/2023   BUN 13 05/08/2023   NA 137 05/08/2023   K 4.3 05/08/2023   CL 96 05/08/2023   CO2 23 05/08/2023    Lab Results  Component Value Date   CHOL 197 04/07/2023   HDL 46 04/07/2023   LDLCALC 112 (H) 04/07/2023   TRIG 225 (H) 04/07/2023   CHOLHDL 4.3 04/07/2023    Medications Reviewed Today     Reviewed by Particia Lather, RPH (Pharmacist) on 06/27/23 at 1500  Med List Status: <None>   Medication Order Taking? Sig Documenting Provider Last Dose Status Informant  amLODipine (NORVASC) 10 MG tablet [id number] Yes TAKE ONE (1) TABLET  EACH DAY (NEEDS TO BE SEEN BEFORE NEXT REFILL) Junie Spencer, FNP Taking Active   benazepril (LOTENSIN) 40 MG tablet [id number] Yes TAKE ONE (1) TABLET EACH DAY (NEEDS TO BE SEEN BEFORE NEXT REFILL) Junie Spencer, FNP Taking Active   blood glucose meter kit and supplies [id number]  Dispense based on patient and insurance preference. Use up to four times daily as directed.  (FOR ICD-10 E10.9, E11.9). Junie Spencer, FNP  Active   Blood Glucose Monitoring Suppl (GNP EASY TOUCH GLUCOSE METER) DEVI [id number]  Check BS up to QID Dx E11.9 Jannifer Rodney A, FNP  Active   busPIRone (BUSPAR) 5 MG tablet [id number] Yes Take 1 tablet (5 mg total) by mouth 3 (three) times daily as needed. Junie Spencer, FNP Taking Active            Med Note Particia Lather   Tue Jun 27, 2023  2:27 PM) Taking PRN  dapagliflozin propanediol (FARXIGA) 5 MG TABS tablet [id number] No Take 1 tablet (5 mg total) by mouth daily before breakfast.  Patient not taking: Reported on 06/27/2023   Junie Spencer, FNP Not Taking Active   DULoxetine (CYMBALTA) 30 MG capsule [id number] Yes Take 1 capsule (30 mg total) by mouth daily. Junie Spencer, FNP Taking Active            Med Note Particia Lather   Tue Jun 27, 2023  2:27 PM) Taking PRN  glucose blood (GNP EASY TOUCH GLUCOSE TEST) test strip [id number]  Check BS up to QID Dx E11.9 Jannifer Rodney A, FNP  Active   hydrochlorothiazide (HYDRODIURIL) 25 MG tablet [id number] Yes TAKE ONE (1) TABLET EACH DAY Hawks, [place] A, FNP Taking Active   insulin glargine, 1 Unit Dial, (TOUJEO SOLOSTAR) 300 UNIT/ML Solostar Pen [id number]  Inject 30-45 Units into the skin daily. Junie Spencer, FNP  Active   Insulin Pen Needle 29G X MISC [id number]  Use with Insulin twice daily Dx E11.69 Jannifer Rodney A, FNP  Active   levocetirizine (XYZAL) 5 MG tablet [id number]  Take 1 tablet (5 mg total) by mouth at bedtime as needed (drainage). Delynn Flavin M, DO  Active   levothyroxine (SYNTHROID) 200 MCG tablet [id number] Yes TAKE 1 TABLET BY MOUTH EVERY MORNING BEFORE BREAKFAST Jannifer Rodney A, FNP Taking Active   levothyroxine (SYNTHROID) 25 MCG tablet [id number] Yes TAKE 1 TABLET BY MOUTH EVERY MORNING BEFORE BREAKFAST Hawks, Christy A, FNP Taking Active   meloxicam (MOBIC) 7.5 MG tablet [id number] No Take 1 tablet (7.5 mg total) by mouth daily.  Patient not taking: Reported  on 06/27/2023   Junie Spencer, FNP Not Taking Active   rosuvastatin (CRESTOR) 5 MG tablet [id number]  Take 1 tablet (5 mg total) by mouth daily.  Patient not taking: Reported on 05/08/2023   Junie Spencer, FNP  Active   TRUEplus Lancets 30G MISC [id number]  Check BS up to QID Dx E11.9 Jannifer Rodney A, FNP  Active             Assessment/Plan:   Diabetes: - Currently uncontrolled with last A1c of 13.1% on 04/07/23 - pt states she had recently started Toujeo (insulin glargine) at that time. BG improved per patient reported fasting BG, though suspect that post-prandial BG are likely elevated. Great candidate for GLP-1 or GLP-1/GIP RA with metabolic syndrome and BMI >45, however pt is not willing to start new medication today and is specifically weary of side effects with GLP-1. Discussed  progressive nature of T2DM and likelihood of needing multiple medications or meal-time insulin to control BG. Also good candidate for CGM, but not interested at this time. Discussed importance of statin therapy and limited influence of diet on LDL-C.  - Reviewed long term cardiovascular and renal outcomes of uncontrolled blood sugar - Reviewed goal A1c, goal fasting, and goal 2 hour post prandial glucose - Reviewed dietary modifications including reducing intake of carbohydrate heavy foods - Reviewed lifestyle modifications including: increasing physical activity as able - Recommend to continue insulin glargine (Toujeo U300) 32 units every morning (updated prescription to allow patient to be dispensed a full box of insulin pens) - Instructed patient to make appt to see Dr. Lendon Colonel this month - recheck A1c and evaluate need for additional therapy.  - Attempt to retrial statin at f/u appt - ideally high-intensity statin - Recommend to check glucose once daily fasting and 2 hour post-prandially  Follow Up Plan: pharmacist telephone 08/01/23 30 min of patient care was provided to the patient during this visit time     Nils Pyle, PharmD PGY1 Pharmacy Resident  Kieth Brightly, PharmD, BCACP Clinical Pharmacist, [facility]

## 2023-07-25 ENCOUNTER — Telehealth: Payer: Self-pay | Admitting: Family

## 2023-08-01 ENCOUNTER — Telehealth: Payer: Medicare HMO

## 2023-08-01 NOTE — Progress Notes (Deleted)
Attempted to contact patient for scheduled appointment for medication management. Left HIPAA compliant message for patient to return my call at their convenience.   Nils Pyle, PharmD PGY1 Pharmacy Resident

## 2023-08-28 ENCOUNTER — Ambulatory Visit: Payer: Medicare HMO | Admitting: Family

## 2023-08-28 ENCOUNTER — Encounter: Payer: Self-pay | Admitting: Family

## 2023-08-28 VITALS — BP 134/85 | HR 86 | Temp 97.6°F | Ht 67.0 in | Wt 284.0 lb

## 2023-08-28 DIAGNOSIS — F3341 Major depressive disorder, recurrent, in partial remission: Secondary | ICD-10-CM

## 2023-08-28 DIAGNOSIS — M5441 Lumbago with sciatica, right side: Secondary | ICD-10-CM

## 2023-08-28 DIAGNOSIS — G8929 Other chronic pain: Secondary | ICD-10-CM

## 2023-08-28 DIAGNOSIS — M25562 Pain in left knee: Secondary | ICD-10-CM | POA: Diagnosis not present

## 2023-08-28 DIAGNOSIS — M159 Polyosteoarthritis, unspecified: Secondary | ICD-10-CM | POA: Diagnosis not present

## 2023-08-28 DIAGNOSIS — Z91199 Patient's noncompliance with other medical treatment and regimen due to unspecified reason: Secondary | ICD-10-CM | POA: Diagnosis not present

## 2023-08-28 DIAGNOSIS — Z794 Long term (current) use of insulin: Secondary | ICD-10-CM | POA: Diagnosis not present

## 2023-08-28 DIAGNOSIS — M5442 Lumbago with sciatica, left side: Secondary | ICD-10-CM | POA: Diagnosis not present

## 2023-08-28 DIAGNOSIS — E039 Hypothyroidism, unspecified: Secondary | ICD-10-CM | POA: Diagnosis not present

## 2023-08-28 DIAGNOSIS — E1169 Type 2 diabetes mellitus with other specified complication: Secondary | ICD-10-CM

## 2023-08-28 DIAGNOSIS — I10 Essential (primary) hypertension: Secondary | ICD-10-CM | POA: Diagnosis not present

## 2023-08-28 DIAGNOSIS — I739 Peripheral vascular disease, unspecified: Secondary | ICD-10-CM

## 2023-08-28 DIAGNOSIS — M25561 Pain in right knee: Secondary | ICD-10-CM | POA: Diagnosis not present

## 2023-08-28 DIAGNOSIS — L309 Dermatitis, unspecified: Secondary | ICD-10-CM

## 2023-08-28 DIAGNOSIS — E785 Hyperlipidemia, unspecified: Secondary | ICD-10-CM | POA: Diagnosis not present

## 2023-08-28 MED ORDER — TRIAMCINOLONE ACETONIDE 0.5 % EX OINT
1.0000 | TOPICAL_OINTMENT | Freq: Two times a day (BID) | CUTANEOUS | 0 refills | Status: DC
Start: 2023-08-28 — End: 2023-12-08

## 2023-08-28 NOTE — Progress Notes (Signed)
Subjective:    Patient ID: Cynthia Espinoza, female    DOB: 06-20-1956, 67 y.o.   MRN: [id number]  Chief Complaint  Patient presents with   Medical Management of Chronic Issues   Pt presents to the office today for chronic follow up and uncontrolled DM. Pt's A1C was 13.4 on 04/07/23. We added Farxiga 5mg  and Toujeo 40 units. States she has only taken the [place] a few times because of palpitations.   She is morbid obese with a BMI of 44.      08/28/2023    3:43 PM 05/08/2023    2:20 PM 04/07/2023    2:45 PM  Last 3 Weights  Weight (lbs) 284 lb 289 lb 285 lb 3.2 oz  Weight (kg) 128.822 kg 131.09 kg 129.366 kg     Hypertension This is a chronic problem. The current episode started more than 1 year ago. The problem has been resolved since onset. The problem is controlled. Pertinent negatives include no blurred vision, malaise/fatigue, peripheral edema or shortness of breath. Risk factors for coronary artery disease include dyslipidemia, diabetes mellitus, obesity and sedentary lifestyle. The current treatment provides moderate improvement. Identifiable causes of hypertension include a thyroid problem.  Thyroid Problem Presents for follow-up visit. Symptoms include dry skin. Patient reports no anxiety, cold intolerance or constipation. The symptoms have been stable. Her past medical history is significant for hyperlipidemia.  Hyperlipidemia This is a chronic problem. The current episode started more than 1 year ago. Exacerbating diseases include obesity. Pertinent negatives include no shortness of breath. Current antihyperlipidemic treatment includes diet change. The current treatment provides no improvement of lipids. Risk factors for coronary artery disease include diabetes mellitus, dyslipidemia, hypertension, a sedentary lifestyle and post-menopausal.  Diabetes She presents for her follow-up diabetic visit. She has type 2 diabetes mellitus. Pertinent negatives for hypoglycemia include no  nervousness/anxiousness. Pertinent negatives for diabetes include no blurred vision and no foot paresthesias. Pertinent negatives for diabetic complications include no peripheral neuropathy. Risk factors for coronary artery disease include dyslipidemia, diabetes mellitus, hypertension, sedentary lifestyle and post-menopausal. She is following a generally unhealthy diet. Her overall blood glucose range is 140-180 mg/dl.  Back Pain This is a chronic problem. The current episode started more than 1 year ago. The problem occurs intermittently. The problem has been waxing and waning since onset. The pain is present in the lumbar spine. The quality of the pain is described as aching. The pain is at a severity of 3/10. The pain is moderate. She has tried bed rest for the symptoms. The treatment provided moderate relief.  Arthritis Presents for follow-up visit. She complains of pain and stiffness. She reports no joint warmth. The symptoms have been stable. Affected locations include the left knee, right knee, left MCP and right MCP. Her pain is at a severity of 6/10.      Review of Systems  Constitutional:  Negative for malaise/fatigue.  Eyes:  Negative for blurred vision.  Respiratory:  Negative for shortness of breath.   Gastrointestinal:  Negative for constipation.  Endocrine: Negative for cold intolerance.  Musculoskeletal:  Positive for arthritis, back pain and stiffness.  Psychiatric/Behavioral:  The patient is not nervous/anxious.   All other systems reviewed and are negative.      Objective:   Physical Exam Vitals reviewed.  Constitutional:      General: She is not in acute distress.    Appearance: She is well-developed. She is obese.  HENT:     Head: Normocephalic and atraumatic.  Right Ear: Tympanic membrane normal.     Left Ear: Tympanic membrane normal.  Eyes:     Pupils: Pupils are equal, round, and reactive to light.  Neck:     Thyroid: No thyromegaly.  Cardiovascular:      Rate and Rhythm: Normal rate and regular rhythm.     Heart sounds: Normal heart sounds. No murmur heard. Pulmonary:     Effort: Pulmonary effort is normal. No respiratory distress.     Breath sounds: Normal breath sounds. No wheezing.  Abdominal:     General: Bowel sounds are normal. There is no distension.     Palpations: Abdomen is soft.     Tenderness: There is no abdominal tenderness.  Musculoskeletal:        General: No tenderness. Normal range of motion.     Cervical back: Normal range of motion and neck supple.  Skin:    General: Skin is warm and dry.     Findings: Rash present.     Comments: Bilateral discoloration of lower legs, dry cracked skin on left hand  Neurological:     Mental Status: She is alert and oriented to person, place, and time.     Cranial Nerves: No cranial nerve deficit.     Deep Tendon Reflexes: Reflexes are normal and symmetric.  Psychiatric:        Behavior: Behavior normal.        Thought Content: Thought content normal.        Judgment: Judgment normal.        BP 134/85   Pulse 86   Temp 97.6 F (36.4 C) (Temporal)   Ht 5\' 7"  (1.702 m)   Wt 284 lb (128.8 kg)   SpO2 93%   BMI 44.48 kg/m   Assessment & Plan:   Cynthia Espinoza comes in today with chief complaint of Medical Management of Chronic Issues   Diagnosis and orders addressed:  1. Chronic bilateral low back pain with bilateral sciatica - CMP14+EGFR  2. Type 2 diabetes mellitus with other specified complication, with long-term current use of insulin (HCC) Pt will restart Farxiga  5 mg  - Bayer DCA Hb A1c Waived - CMP14+EGFR  3. Chronic pain of both knees - CMP14+EGFR  4. Essential hypertension - CMP14+EGFR  5. Generalized OA - CMP14+EGFR  6. Hyperlipidemia associated with type 2 diabetes mellitus (HCC) - CMP14+EGFR  7. Hypothyroidism, unspecified type - CMP14+EGFR  8. Morbid obesity (HCC) - CMP14+EGFR  9. Noncompliance - CMP14+EGFR  10. Recurrent major  depressive disorder, in partial remission (HCC) - CMP14+EGFR  11. PAD (peripheral artery disease) (HCC) - CMP14+EGFR  12. Eczema, unspecified type - triamcinolone ointment (KENALOG) 0.5 %; Apply 1 Application topically 2 (two) times daily.  Dispense: 30 g; Refill: 0   Labs pending Health Maintenance reviewed Diet and exercise encouraged  Follow up plan: 3 months   Jannifer Rodney, FNP

## 2023-08-29 LAB — CMP14+EGFR
ALT: 47 IU/L — ABNORMAL HIGH (ref 0–32)
AST: 49 IU/L — ABNORMAL HIGH (ref 0–40)
Albumin: 4.1 g/dL (ref 3.9–4.9)
Alkaline Phosphatase: 63 IU/L (ref 44–121)
BUN/Creatinine Ratio: 20 (ref 12–28)
BUN: 14 mg/dL (ref 8–27)
Bilirubin Total: 0.4 mg/dL (ref 0.0–1.2)
CO2: 21 mmol/L (ref 20–29)
Calcium: 9.4 mg/dL (ref 8.7–10.3)
Chloride: 99 mmol/L (ref 96–106)
Creatinine, Ser: 0.69 mg/dL (ref 0.57–1.00)
Globulin, Total: 2.9 g/dL (ref 1.5–4.5)
Glucose: 307 mg/dL — ABNORMAL HIGH (ref 70–99)
Potassium: 4 mmol/L (ref 3.5–5.2)
Sodium: 137 mmol/L (ref 134–144)
Total Protein: 7 g/dL (ref 6.0–8.5)
eGFR: 96 mL/min/{1.73_m2} (ref >59)

## 2023-08-29 LAB — BAYER DCA HB A1C WAIVED: HB A1C (BAYER DCA - WAIVED): 13.2 % — ABNORMAL HIGH (ref 4.8–5.6)

## 2023-08-30 ENCOUNTER — Other Ambulatory Visit: Payer: Self-pay | Admitting: Family Medicine

## 2023-08-30 MED ORDER — SEMAGLUTIDE(0.25 OR 0.5MG/DOS) 2 MG/3ML ~~LOC~~ SOPN: 0.2500 mg | PEN_INJECTOR | SUBCUTANEOUS | 0 refills | Status: DC

## 2023-08-31 ENCOUNTER — Telehealth: Payer: Self-pay | Admitting: Family

## 2023-08-31 NOTE — Telephone Encounter (Signed)
Patient aware she is to stay on all her meds we are just adding the ozempic.

## 2023-08-31 NOTE — Telephone Encounter (Signed)
Pt wants to make sure with nurse what she is should be taking insulin glargine, 1 Unit Dial, (TOUJEO SOLOSTAR) 300 UNIT/ML Solostar Pen ozempic. Please call back

## 2023-09-05 ENCOUNTER — Other Ambulatory Visit: Payer: Self-pay | Admitting: Family

## 2023-09-05 DIAGNOSIS — I10 Essential (primary) hypertension: Secondary | ICD-10-CM

## 2023-10-23 ENCOUNTER — Telehealth: Payer: Self-pay | Admitting: Family

## 2023-11-06 ENCOUNTER — Other Ambulatory Visit: Payer: Self-pay | Admitting: Family

## 2023-11-06 DIAGNOSIS — Z794 Long term (current) use of insulin: Secondary | ICD-10-CM

## 2023-11-17 ENCOUNTER — Ambulatory Visit: Payer: Medicare HMO | Admitting: Family

## 2023-12-05 ENCOUNTER — Other Ambulatory Visit: Payer: Self-pay | Admitting: Family

## 2023-12-08 ENCOUNTER — Encounter: Payer: Self-pay | Admitting: Family

## 2023-12-08 ENCOUNTER — Ambulatory Visit (INDEPENDENT_AMBULATORY_CARE_PROVIDER_SITE_OTHER): Payer: 59

## 2023-12-08 ENCOUNTER — Ambulatory Visit (INDEPENDENT_AMBULATORY_CARE_PROVIDER_SITE_OTHER): Payer: 59 | Admitting: Family

## 2023-12-08 VITALS — BP 132/81 | HR 70 | Temp 97.6°F | Ht 67.0 in | Wt 282.0 lb

## 2023-12-08 DIAGNOSIS — E785 Hyperlipidemia, unspecified: Secondary | ICD-10-CM | POA: Diagnosis not present

## 2023-12-08 DIAGNOSIS — M159 Polyosteoarthritis, unspecified: Secondary | ICD-10-CM | POA: Diagnosis not present

## 2023-12-08 DIAGNOSIS — E039 Hypothyroidism, unspecified: Secondary | ICD-10-CM

## 2023-12-08 DIAGNOSIS — M5441 Lumbago with sciatica, right side: Secondary | ICD-10-CM | POA: Diagnosis not present

## 2023-12-08 DIAGNOSIS — Z794 Long term (current) use of insulin: Secondary | ICD-10-CM | POA: Diagnosis not present

## 2023-12-08 DIAGNOSIS — E1169 Type 2 diabetes mellitus with other specified complication: Secondary | ICD-10-CM

## 2023-12-08 DIAGNOSIS — Z780 Asymptomatic menopausal state: Secondary | ICD-10-CM

## 2023-12-08 DIAGNOSIS — M5442 Lumbago with sciatica, left side: Secondary | ICD-10-CM

## 2023-12-08 DIAGNOSIS — I10 Essential (primary) hypertension: Secondary | ICD-10-CM | POA: Diagnosis not present

## 2023-12-08 DIAGNOSIS — M25561 Pain in right knee: Secondary | ICD-10-CM

## 2023-12-08 DIAGNOSIS — R5383 Other fatigue: Secondary | ICD-10-CM

## 2023-12-08 DIAGNOSIS — M797 Fibromyalgia: Secondary | ICD-10-CM

## 2023-12-08 DIAGNOSIS — G8929 Other chronic pain: Secondary | ICD-10-CM

## 2023-12-08 DIAGNOSIS — Z91199 Patient's noncompliance with other medical treatment and regimen due to unspecified reason: Secondary | ICD-10-CM

## 2023-12-08 DIAGNOSIS — L309 Dermatitis, unspecified: Secondary | ICD-10-CM

## 2023-12-08 LAB — BAYER DCA HB A1C WAIVED: HB A1C (BAYER DCA - WAIVED): 12.1 % — ABNORMAL HIGH (ref 4.8–5.6)

## 2023-12-08 MED ORDER — LEVOTHYROXINE SODIUM 25 MCG PO TABS: 25.0000 ug | ORAL_TABLET | Freq: Every day | ORAL | 1 refills | Status: DC

## 2023-12-08 MED ORDER — HYDROCHLOROTHIAZIDE 25 MG PO TABS: ORAL_TABLET | ORAL | 1 refills | Status: DC

## 2023-12-08 MED ORDER — AMLODIPINE BESYLATE 10 MG PO TABS: ORAL_TABLET | ORAL | 3 refills | Status: DC

## 2023-12-08 MED ORDER — DAPAGLIFLOZIN PROPANEDIOL 5 MG PO TABS: 5.0000 mg | ORAL_TABLET | Freq: Every day | ORAL | 1 refills | Status: DC

## 2023-12-08 MED ORDER — MELOXICAM 7.5 MG PO TABS: 7.5000 mg | ORAL_TABLET | Freq: Every day | ORAL | 3 refills | Status: DC

## 2023-12-08 MED ORDER — TRIAMCINOLONE ACETONIDE 0.5 % EX OINT: 1.0000 | TOPICAL_OINTMENT | Freq: Two times a day (BID) | CUTANEOUS | 0 refills | Status: AC

## 2023-12-08 MED ORDER — LEVOTHYROXINE SODIUM 200 MCG PO TABS: 200.0000 ug | ORAL_TABLET | Freq: Every day | ORAL | 1 refills | Status: DC

## 2023-12-08 MED ORDER — ROSUVASTATIN CALCIUM 5 MG PO TABS: 5.0000 mg | ORAL_TABLET | Freq: Every day | ORAL | 3 refills | Status: DC

## 2023-12-08 MED ORDER — BENAZEPRIL HCL 40 MG PO TABS: ORAL_TABLET | ORAL | 3 refills | Status: DC

## 2023-12-08 MED ORDER — DULOXETINE HCL 30 MG PO CPEP: 30.0000 mg | ORAL_CAPSULE | Freq: Every day | ORAL | 3 refills | Status: AC

## 2023-12-08 MED ORDER — TOUJEO SOLOSTAR 300 UNIT/ML ~~LOC~~ SOPN: 55.0000 [IU] | PEN_INJECTOR | Freq: Every day | SUBCUTANEOUS | 3 refills | Status: DC

## 2023-12-08 NOTE — Progress Notes (Signed)
Subjective:    Patient ID: Cynthia Espinoza, female    DOB: 1955-11-17, 68 y.o.   MRN: [id number]  Chief Complaint  Patient presents with   Medical Management of Chronic Issues   Pt presents to the office today for CPE and chronic follow up and uncontrolled DM. Pt's A1C was 13.4 on 08/28/23. We added Ozempic 0.25 mg, but stopped this because she had abdominal pain. She was given [place] 5 mg, but states this caused her nausea. She has been taking Toujeo 45 units.   She is morbid obese with a BMI of 44.   Complaining of fatigue.      12/08/2023    3:09 PM 08/28/2023    3:43 PM 05/08/2023    2:20 PM  Last 3 Weights  Weight (lbs) 282 lb 284 lb 289 lb  Weight (kg) 127.914 kg 128.822 kg 131.09 kg     Hypertension This is a chronic problem. The current episode started more than 1 year ago. The problem has been resolved since onset. The problem is controlled. Pertinent negatives include no blurred vision, malaise/fatigue, peripheral edema or shortness of breath. Risk factors for coronary artery disease include dyslipidemia, diabetes mellitus, obesity and sedentary lifestyle. The current treatment provides moderate improvement. Identifiable causes of hypertension include a thyroid problem.  Thyroid Problem Presents for follow-up visit. Symptoms include dry skin. Patient reports no anxiety, cold intolerance or constipation. The symptoms have been stable.  Hyperlipidemia This is a chronic problem. The current episode started more than 1 year ago. Exacerbating diseases include obesity. Pertinent negatives include no shortness of breath. Current antihyperlipidemic treatment includes diet change. The current treatment provides no improvement of lipids. Risk factors for coronary artery disease include diabetes mellitus, dyslipidemia, hypertension, a sedentary lifestyle and post-menopausal.  Diabetes She presents for her follow-up diabetic visit. She has type 2 diabetes mellitus. Pertinent negatives  for hypoglycemia include no nervousness/anxiousness. Pertinent negatives for diabetes include no blurred vision and no foot paresthesias. Pertinent negatives for diabetic complications include no peripheral neuropathy. Risk factors for coronary artery disease include dyslipidemia, diabetes mellitus, hypertension, sedentary lifestyle and post-menopausal. She is following a generally unhealthy diet. Her overall blood glucose range is >200 mg/dl.  Back Pain This is a chronic problem. The current episode started more than 1 year ago. The problem occurs intermittently. The problem has been waxing and waning since onset. The pain is present in the lumbar spine. The quality of the pain is described as aching. The pain is at a severity of 8/10. The pain is moderate. She has tried bed rest for the symptoms. The treatment provided moderate relief.  Arthritis Presents for follow-up visit. She complains of pain and stiffness. She reports no joint warmth. The symptoms have been stable. Affected locations include the left knee, right knee, left MCP, right MCP, left elbow and right elbow. Her pain is at a severity of 8/10.      Review of Systems  Constitutional:  Negative for malaise/fatigue.  Eyes:  Negative for blurred vision.  Respiratory:  Negative for shortness of breath.   Gastrointestinal:  Negative for constipation.  Endocrine: Negative for cold intolerance.  Musculoskeletal:  Positive for back pain and stiffness.  Psychiatric/Behavioral:  The patient is not nervous/anxious.   All other systems reviewed and are negative.      Objective:   Physical Exam Vitals reviewed.  Constitutional:      General: She is not in acute distress.    Appearance: She is well-developed. She is  obese.  HENT:     Head: Normocephalic and atraumatic.     Right Ear: Tympanic membrane normal.     Left Ear: Tympanic membrane normal.  Eyes:     Pupils: Pupils are equal, round, and reactive to light.  Neck:      Thyroid: No thyromegaly.  Cardiovascular:     Rate and Rhythm: Normal rate and regular rhythm.     Heart sounds: Murmur heard.  Pulmonary:     Effort: Pulmonary effort is normal. No respiratory distress.     Breath sounds: Normal breath sounds. No wheezing.  Abdominal:     General: Bowel sounds are normal. There is no distension.     Palpations: Abdomen is soft.     Tenderness: There is no abdominal tenderness.  Musculoskeletal:        General: No tenderness. Normal range of motion.     Cervical back: Normal range of motion and neck supple.     Right lower leg: Edema (trace) present.     Left lower leg: Edema (trace) present.  Skin:    General: Skin is warm and dry.     Findings: Rash present.     Comments: Bilateral discoloration of lower legs, dry cracked skin on left hand  Neurological:     Mental Status: She is alert and oriented to person, place, and time.     Cranial Nerves: No cranial nerve deficit.     Deep Tendon Reflexes: Reflexes are normal and symmetric.  Psychiatric:        Behavior: Behavior normal.        Thought Content: Thought content normal.        Judgment: Judgment normal.        BP 132/81   Pulse 70   Temp 97.6 F (36.4 C) (Temporal)   Ht 5\' 7"  (1.702 m)   Wt 282 lb (127.9 kg)   SpO2 96%   BMI 44.17 kg/m   Assessment & Plan:   [name] comes in today with chief complaint of Medical Management of Chronic Issues   Diagnosis and orders addressed: 1. Essential hypertension - amLODipine (NORVASC) 10 MG tablet; TAKE ONE (1) TABLET EACH DAY (NEEDS TO BE SEEN BEFORE NEXT REFILL)  Dispense: 90 tablet; Refill: 3 - benazepril (LOTENSIN) 40 MG tablet; TAKE ONE (1) TABLET EACH DAY (NEEDS TO BE SEEN BEFORE NEXT REFILL)  Dispense: 90 tablet; Refill: 3 - hydrochlorothiazide (HYDRODIURIL) 25 MG tablet; TAKE ONE (1) TABLET BY MOUTH EVERY DAY  Dispense: 90 tablet; Refill: 1 - CMP14+EGFR  2. Type 2 diabetes mellitus with other specified complication, with  long-term current use of insulin (HCC) (Primary) Will increase Toujeo to 55 units from 45 units Strict low carb diet  - dapagliflozin propanediol (FARXIGA) 5 MG TABS tablet; Take 1 tablet (5 mg total) by mouth daily.  Dispense: 90 tablet; Refill: 1 - insulin glargine, 1 Unit Dial, (TOUJEO SOLOSTAR) 300 UNIT/ML Solostar Pen; Inject 55 Units into the skin daily.  Dispense: 4.5 mL; Refill: 3 - Bayer DCA Hb A1c Waived - CMP14+EGFR  3. Fibromyalgia - DULoxetine (CYMBALTA) 30 MG capsule; Take 1 capsule (30 mg total) by mouth daily.  Dispense: 90 capsule; Refill: 3 - meloxicam (MOBIC) 7.5 MG tablet; Take 1 tablet (7.5 mg total) by mouth daily.  Dispense: 90 tablet; Refill: 3 - CMP14+EGFR  4. Hypothyroidism, unspecified type - levothyroxine (SYNTHROID) 200 MCG tablet; Take 1 tablet (200 mcg total) by mouth daily before breakfast.  Dispense: 90 tablet;  Refill: 1 - CMP14+EGFR  5. Chronic pain of both knees - meloxicam (MOBIC) 7.5 MG tablet; Take 1 tablet (7.5 mg total) by mouth daily.  Dispense: 90 tablet; Refill: 3 - CMP14+EGFR  6. Hyperlipidemia associated with type 2 diabetes mellitus (HCC) - rosuvastatin (CRESTOR) 5 MG tablet; Take 1 tablet (5 mg total) by mouth daily.  Dispense: 90 tablet; Refill: 3 - CMP14+EGFR  7. Eczema, unspecified type - triamcinolone ointment (KENALOG) 0.5 %; Apply 1 Application topically 2 (two) times daily.  Dispense: 30 g; Refill: 0 - CMP14+EGFR  8. Chronic bilateral low back pain with bilateral sciatica - CMP14+EGFR  9. Generalized OA - CMP14+EGFR  10. Morbid obesity (HCC) - CMP14+EGFR  11. Noncompliance - CMP14+EGFR  12. Other fatigue - Vitamin B12  13. Post-menopausal - DG [facility] DEXA   Labs pending Continue current medications  Will increase Toujeo to 55 units from 45 units  Health Maintenance reviewed Diet and exercise encouraged  Follow up plan: 1 months   Jannifer Rodney, FNP

## 2023-12-08 NOTE — Patient Instructions (Signed)

## 2023-12-09 LAB — CMP14+EGFR
ALT: 43 [IU]/L — ABNORMAL HIGH (ref 0–32)
AST: 38 [IU]/L (ref 0–40)
Albumin: 4.1 g/dL (ref 3.9–4.9)
Alkaline Phosphatase: 62 [IU]/L (ref 44–121)
BUN/Creatinine Ratio: 17 (ref 12–28)
BUN: 11 mg/dL (ref 8–27)
Bilirubin Total: 0.4 mg/dL (ref 0.0–1.2)
CO2: 24 mmol/L (ref 20–29)
Calcium: 9.7 mg/dL (ref 8.7–10.3)
Chloride: 97 mmol/L (ref 96–106)
Creatinine, Ser: 0.63 mg/dL (ref 0.57–1.00)
Globulin, Total: 2.9 g/dL (ref 1.5–4.5)
Glucose: 264 mg/dL — ABNORMAL HIGH (ref 70–99)
Potassium: 4 mmol/L (ref 3.5–5.2)
Sodium: 136 mmol/L (ref 134–144)
Total Protein: 7 g/dL (ref 6.0–8.5)
eGFR: 98 mL/min/{1.73_m2} (ref >59)

## 2023-12-09 LAB — VITAMIN B12: Vitamin B-12: 231 pg/mL — ABNORMAL LOW (ref 232–1245)

## 2023-12-11 ENCOUNTER — Telehealth: Payer: Self-pay | Admitting: Family

## 2023-12-11 NOTE — Telephone Encounter (Unsigned)
Copied from CRM [id number]. Topic: Clinical - Lab/Test Results >> Dec 11, 2023  5:57 PM Clayton Bibles wrote: Reason for CRM: Cynthia Espinoza is returning a call. She thinks it's about her labs. Please have a nurse call her at [phone]

## 2023-12-13 DIAGNOSIS — M85851 Other specified disorders of bone density and structure, right thigh: Secondary | ICD-10-CM | POA: Diagnosis not present

## 2023-12-13 DIAGNOSIS — Z780 Asymptomatic menopausal state: Secondary | ICD-10-CM | POA: Diagnosis not present

## 2023-12-14 ENCOUNTER — Telehealth: Payer: Self-pay | Admitting: Family

## 2023-12-14 NOTE — Telephone Encounter (Signed)
Refer to labs

## 2023-12-14 NOTE — Telephone Encounter (Signed)
Copied from CRM [id number]. Topic: General - Other >> Dec 13, 2023  4:23 PM Eunice Blase wrote: Reason for CRM: Received call from pt returning call please call pt has questions regarding lab results.  [phone]

## 2023-12-15 ENCOUNTER — Other Ambulatory Visit: Payer: Self-pay | Admitting: Family

## 2023-12-15 DIAGNOSIS — M8580 Other specified disorders of bone density and structure, unspecified site: Secondary | ICD-10-CM | POA: Insufficient documentation

## 2023-12-18 ENCOUNTER — Ambulatory Visit: Payer: 59

## 2023-12-18 ENCOUNTER — Other Ambulatory Visit: Payer: 59

## 2023-12-25 ENCOUNTER — Telehealth: Payer: Self-pay

## 2023-12-25 NOTE — Telephone Encounter (Signed)
 Tired to call patient mail box is full. No she will need a shot every day for one week and then one weekly for a month and then 1 monthly

## 2023-12-25 NOTE — Telephone Encounter (Signed)
 Copied from CRM [id number]. Topic: Clinical - Medical Advice >> Dec 22, 2023  4:50 PM Bridgette Campus T wrote: Reason for CRM: Patient asking if she can get her B12 shot at her next appt instead of coming in separately, please call [phone]

## 2023-12-26 ENCOUNTER — Ambulatory Visit: Payer: 59

## 2023-12-27 ENCOUNTER — Telehealth: Payer: Self-pay | Admitting: Family

## 2023-12-27 DIAGNOSIS — E1169 Type 2 diabetes mellitus with other specified complication: Secondary | ICD-10-CM

## 2023-12-27 NOTE — Telephone Encounter (Signed)
Pt says PCP wants her to increase her insulin but pt says what she gets right now at the pharmacy, wont be enough for her to increase. Says she talked to Arlys John at the drug store and he told her that her insurance would cover for her to get 2 boxes of insulin each month. Just needs PCP to approve and send in.

## 2023-12-29 MED ORDER — TOUJEO SOLOSTAR 300 UNIT/ML ~~LOC~~ SOPN: 60.0000 [IU] | PEN_INJECTOR | Freq: Every day | SUBCUTANEOUS | 3 refills | Status: DC

## 2023-12-29 NOTE — Telephone Encounter (Signed)
Prescription sent to pharmacy.

## 2024-01-12 ENCOUNTER — Encounter: Payer: Self-pay | Admitting: Family

## 2024-01-12 ENCOUNTER — Ambulatory Visit: Payer: 59 | Admitting: Family

## 2024-01-12 VITALS — BP 138/79 | HR 71 | Temp 98.0°F | Ht 67.0 in | Wt 273.0 lb

## 2024-01-12 DIAGNOSIS — E1169 Type 2 diabetes mellitus with other specified complication: Secondary | ICD-10-CM | POA: Diagnosis not present

## 2024-01-12 DIAGNOSIS — M5441 Lumbago with sciatica, right side: Secondary | ICD-10-CM | POA: Diagnosis not present

## 2024-01-12 DIAGNOSIS — M159 Polyosteoarthritis, unspecified: Secondary | ICD-10-CM

## 2024-01-12 DIAGNOSIS — M5442 Lumbago with sciatica, left side: Secondary | ICD-10-CM

## 2024-01-12 DIAGNOSIS — E039 Hypothyroidism, unspecified: Secondary | ICD-10-CM | POA: Diagnosis not present

## 2024-01-12 DIAGNOSIS — I10 Essential (primary) hypertension: Secondary | ICD-10-CM

## 2024-01-12 DIAGNOSIS — Z794 Long term (current) use of insulin: Secondary | ICD-10-CM | POA: Diagnosis not present

## 2024-01-12 DIAGNOSIS — M25561 Pain in right knee: Secondary | ICD-10-CM | POA: Diagnosis not present

## 2024-01-12 DIAGNOSIS — E785 Hyperlipidemia, unspecified: Secondary | ICD-10-CM

## 2024-01-12 DIAGNOSIS — M25562 Pain in left knee: Secondary | ICD-10-CM | POA: Diagnosis not present

## 2024-01-12 DIAGNOSIS — Z91199 Patient's noncompliance with other medical treatment and regimen due to unspecified reason: Secondary | ICD-10-CM

## 2024-01-12 DIAGNOSIS — I739 Peripheral vascular disease, unspecified: Secondary | ICD-10-CM | POA: Diagnosis not present

## 2024-01-12 DIAGNOSIS — G8929 Other chronic pain: Secondary | ICD-10-CM

## 2024-01-12 MED ORDER — DAPAGLIFLOZIN PROPANEDIOL 10 MG PO TABS: 10.0000 mg | ORAL_TABLET | Freq: Every day | ORAL | 1 refills | Status: DC

## 2024-01-12 NOTE — Progress Notes (Signed)
 Subjective:    Patient ID: Cynthia Espinoza, female    DOB: August 01, 1956, 68 y.o.   MRN: [id number]  Chief Complaint  Patient presents with   Diabetes   Pt presents to the office today for chronic follow up and uncontrolled DM. Pt's A1C was 12.1 on 12/08/23. We added Ozempic 0.25 mg in the past,  but stopped this because she had abdominal pain. She was given Farxiga 5 mg and taking Toujeo 50-55 units.   She is morbid obese with a BMI of 42.   Complaining of fatigue.      01/12/2024    2:36 PM 12/08/2023    3:09 PM 08/28/2023    3:43 PM  Last 3 Weights  Weight (lbs) 273 lb 282 lb 284 lb  Weight (kg) 123.832 kg 127.914 kg 128.822 kg     Hypertension This is a chronic problem. The current episode started more than 1 year ago. The problem has been resolved since onset. The problem is controlled. Pertinent negatives include no blurred vision, malaise/fatigue, peripheral edema or shortness of breath. Risk factors for coronary artery disease include dyslipidemia, diabetes mellitus, obesity and sedentary lifestyle. The current treatment provides moderate improvement. Identifiable causes of hypertension include a thyroid problem.  Thyroid Problem Presents for follow-up visit. Symptoms include dry skin and hair loss. Patient reports no anxiety, cold intolerance, constipation or fatigue. The symptoms have been stable.  Hyperlipidemia This is a chronic problem. The current episode started more than 1 year ago. The problem is uncontrolled. Recent lipid tests were reviewed and are high. Exacerbating diseases include obesity. Pertinent negatives include no shortness of breath. Current antihyperlipidemic treatment includes diet change. The current treatment provides no improvement of lipids. Risk factors for coronary artery disease include diabetes mellitus, dyslipidemia, hypertension, a sedentary lifestyle and post-menopausal.  Diabetes She presents for her follow-up diabetic visit. She has type 2  diabetes mellitus. Pertinent negatives for hypoglycemia include no nervousness/anxiousness. Pertinent negatives for diabetes include no blurred vision, no fatigue and no foot paresthesias. Pertinent negatives for diabetic complications include no peripheral neuropathy. Risk factors for coronary artery disease include dyslipidemia, diabetes mellitus, hypertension, sedentary lifestyle and post-menopausal. She is following a generally unhealthy diet. Her overall blood glucose range is 180-200 mg/dl.  Back Pain This is a chronic problem. The current episode started more than 1 year ago. The problem occurs intermittently. The problem has been waxing and waning since onset. The pain is present in the lumbar spine. The quality of the pain is described as aching. The pain is at a severity of 8/10. The pain is moderate. She has tried bed rest for the symptoms. The treatment provided moderate relief.  Arthritis Presents for follow-up visit. She complains of pain and stiffness. She reports no joint warmth. The symptoms have been stable. Affected locations include the left knee, right knee, left MCP, right MCP, left elbow and right elbow. Her pain is at a severity of 9/10. Pertinent negatives include no fatigue.      Review of Systems  Constitutional:  Negative for fatigue and malaise/fatigue.  Eyes:  Negative for blurred vision.  Respiratory:  Negative for shortness of breath.   Gastrointestinal:  Negative for constipation.  Endocrine: Negative for cold intolerance.  Musculoskeletal:  Positive for back pain and stiffness.  Psychiatric/Behavioral:  The patient is not nervous/anxious.   All other systems reviewed and are negative.      Objective:   Physical Exam Vitals reviewed.  Constitutional:      General:  She is not in acute distress.    Appearance: She is well-developed. She is obese.  HENT:     Head: Normocephalic and atraumatic.     Right Ear: Tympanic membrane normal.     Left Ear: Tympanic  membrane normal.  Eyes:     Pupils: Pupils are equal, round, and reactive to light.  Neck:     Thyroid: No thyromegaly.  Cardiovascular:     Rate and Rhythm: Normal rate and regular rhythm.     Heart sounds: Murmur heard.  Pulmonary:     Effort: Pulmonary effort is normal. No respiratory distress.     Breath sounds: Normal breath sounds. No wheezing.  Abdominal:     General: Bowel sounds are normal. There is no distension.     Palpations: Abdomen is soft.     Tenderness: There is no abdominal tenderness.  Musculoskeletal:        General: No tenderness. Normal range of motion.     Cervical back: Normal range of motion and neck supple.     Right lower leg: Edema (trace) present.     Left lower leg: Edema (trace) present.  Skin:    General: Skin is warm and dry.     Findings: No rash.     Comments: Bilateral discoloration of lower leg  Neurological:     Mental Status: She is alert and oriented to person, place, and time.     Cranial Nerves: No cranial nerve deficit.     Deep Tendon Reflexes: Reflexes are normal and symmetric.  Psychiatric:        Behavior: Behavior normal.        Thought Content: Thought content normal.        Judgment: Judgment normal.    Diabetic Foot Exam - Simple   Simple Foot Form Diabetic Foot exam was performed with the following findings: Yes 01/12/2024  3:00 PM  Visual Inspection See comments: Yes Sensation Testing Intact to touch and monofilament testing bilaterally: Yes Pulse Check Posterior Tibialis and Dorsalis pulse intact bilaterally: Yes Comments Callus formation on bilateral heels         BP 138/79   Pulse 71   Temp 98 F (36.7 C) (Temporal)   Ht 5\' 7"  (1.702 m)   Wt 273 lb (123.8 kg)   BMI 42.76 kg/m   Assessment & Plan:   GENNAVIEVE HUQ comes in today with chief complaint of Diabetes  1. Essential hypertension (Primary) - CMP14+EGFR  2. Hypothyroidism, unspecified type - CMP14+EGFR  3. Generalized OA -  CMP14+EGFR  4. Chronic bilateral low back pain with bilateral sciatica - CMP14+EGFR  5. Chronic pain of both knees - CMP14+EGFR  6. Morbid obesity (HCC) - CMP14+EGFR  7. Type 2 diabetes mellitus with other specified complication, with long-term current use of insulin (HCC) -Will increase Farxiga to 10 mg from 5 mg Continue Toujeo to 55 units  Strict low carb diet  - CMP14+EGFR - dapagliflozin propanediol (FARXIGA) 10 MG TABS tablet; Take 1 tablet (10 mg total) by mouth daily before breakfast.  Dispense: 90 tablet; Refill: 1 - Microalbumin / creatinine urine ratio  8. Hyperlipidemia associated with type 2 diabetes mellitus (HCC) - CMP14+EGFR  9. Noncompliance - CMP14+EGFR  10. PAD (peripheral artery disease) (HCC) - CMP14+EGFR   Labs pending Continue current medications  Will increase Farxiga to 10 mg from 5 mg  Continue Toujeo 55 units Health Maintenance reviewed Diet and exercise encouraged  Follow up plan: 2  months  Jannifer Rodney, FNP

## 2024-01-12 NOTE — Patient Instructions (Signed)

## 2024-01-13 LAB — CMP14+EGFR
ALT: 47 IU/L — ABNORMAL HIGH (ref 0–32)
AST: 33 IU/L (ref 0–40)
Albumin: 4.2 g/dL (ref 3.9–4.9)
Alkaline Phosphatase: 64 IU/L (ref 44–121)
BUN/Creatinine Ratio: 18 (ref 12–28)
BUN: 12 mg/dL (ref 8–27)
Bilirubin Total: 0.4 mg/dL (ref 0.0–1.2)
CO2: 22 mmol/L (ref 20–29)
Calcium: 9.2 mg/dL (ref 8.7–10.3)
Chloride: 97 mmol/L (ref 96–106)
Creatinine, Ser: 0.65 mg/dL (ref 0.57–1.00)
Globulin, Total: 3 g/dL (ref 1.5–4.5)
Glucose: 261 mg/dL — ABNORMAL HIGH (ref 70–99)
Potassium: 3.9 mmol/L (ref 3.5–5.2)
Sodium: 137 mmol/L (ref 134–144)
Total Protein: 7.2 g/dL (ref 6.0–8.5)
eGFR: 98 mL/min/{1.73_m2} (ref >59)

## 2024-01-14 LAB — MICROALBUMIN / CREATININE URINE RATIO
Creatinine, Urine: 63 mg/dL
Microalb/Creat Ratio: 5 mg/g{creat} (ref 0–29)
Microalbumin, Urine: 3.3 ug/mL

## 2024-01-17 ENCOUNTER — Ambulatory Visit: Payer: Self-pay | Admitting: Family

## 2024-01-17 ENCOUNTER — Encounter: Payer: Self-pay | Admitting: Family Medicine

## 2024-01-17 ENCOUNTER — Telehealth: Payer: Self-pay

## 2024-01-17 NOTE — Telephone Encounter (Signed)
 Lab results reported per provider notes. All questions answered and patient verbalized understanding  Copied from CRM [id number]. Topic: Clinical - Lab/Test Results >> Jan 17, 2024  4:44 PM Tiffany S wrote: Reason for CRM: Patient stated she was calling back regarding lab results don't see any documentation on previous call. Patient requesting call back. Labs from 2/28 Reason for Disposition  [1] Other NON-URGENT information for PCP AND [2] does not require PCP response  Answer Assessment - Initial Assessment Questions 1. REASON FOR CALL or QUESTION: "What is your reason for calling today?" or "How can I best help you?" or "What question do you have that I can help answer?"     Patient called to get her lab results from her visit 01/12/2024. Results given from chart per provider's note. 2. CALLER: Document the source of call. (e.g., laboratory, patient).     patient  Protocols used: PCP Call - No Triage-A-AH

## 2024-01-17 NOTE — Telephone Encounter (Signed)
 Copied from CRM [id number]. Topic: General - Call Back - No Documentation >> Jan 17, 2024  3:15 PM Ja-Kwan M wrote: Reason for CRM: Patient reports that she received a message to call back for lab results. There is no documentation of this call. Patient requests a call back to discuss her lab results.

## 2024-01-18 NOTE — Telephone Encounter (Signed)
 Patient aware.

## 2024-02-23 ENCOUNTER — Telehealth: Payer: Self-pay | Admitting: Family

## 2024-03-28 ENCOUNTER — Ambulatory Visit (INDEPENDENT_AMBULATORY_CARE_PROVIDER_SITE_OTHER): Payer: Medicare Other

## 2024-03-28 VITALS — BP 138/79 | HR 71 | Ht 67.0 in | Wt 273.0 lb

## 2024-03-28 DIAGNOSIS — Z0000 Encounter for general adult medical examination without abnormal findings: Secondary | ICD-10-CM

## 2024-03-28 NOTE — Progress Notes (Signed)
 Subjective:   Cynthia Espinoza is a 68 y.o. who presents for a Medicare Wellness preventive visit.  As a reminder, Annual Wellness Visits don't include a physical exam, and some assessments may be limited, especially if this visit is performed virtually. We may recommend an in-person visit if needed.  Visit Complete: Virtual I connected with  Cynthia Espinoza on 03/28/24 by a audio enabled telemedicine application and verified that I am speaking with the correct person using two identifiers.  Patient Location: Home  Provider Location: Home Office  I discussed the limitations of evaluation and management by telemedicine. The patient expressed understanding and agreed to proceed.  Vital Signs: Because this visit was a virtual/telehealth visit, some criteria may be missing or patient reported. Any vitals not documented were not able to be obtained and vitals that have been documented are patient reported.  VideoDeclined- This patient declined Librarian, academic. Therefore the visit was completed with audio only.  Persons Participating in Visit: Patient.  AWV Questionnaire: No: Patient Medicare AWV questionnaire was not completed prior to this visit.  Cardiac Risk Factors include: advanced age (>75men, >35 women);diabetes mellitus;obesity (BMI >30kg/m2);dyslipidemia;hypertension     Objective:     Today's Vitals   03/28/24 1451  BP: 138/79  Pulse: 71  Weight: 273 lb (123.8 kg)  Height: 5\' 7"  (1.702 m)   Body mass index is 42.76 kg/m.     03/28/2024    3:03 PM 05/23/2023    6:29 PM 03/28/2023    3:27 PM 09/29/2021    4:31 PM 04/12/2019   12:11 PM 04/02/2019   11:00 PM 04/02/2019    6:28 PM  Advanced Directives  Does Patient Have a Medical Advance Directive? No No No No No No No  Would patient like information on creating a medical advance directive?   No - Patient declined No - Patient declined No - Patient declined No - Patient declined     Current  Medications (verified) Outpatient Encounter Medications as of 03/28/2024  Medication Sig   amLODipine  (NORVASC ) 10 MG tablet TAKE ONE (1) TABLET EACH DAY (NEEDS TO BE SEEN BEFORE NEXT REFILL)   benazepril  (LOTENSIN ) 40 MG tablet TAKE ONE (1) TABLET EACH DAY (NEEDS TO BE SEEN BEFORE NEXT REFILL)   blood glucose meter kit and supplies Dispense based on patient and insurance preference. Use up to four times daily as directed. (FOR ICD-10 E10.9, E11.9).   Blood Glucose Monitoring Suppl (GNP EASY TOUCH GLUCOSE METER) DEVI Check BS up to QID Dx E11.9   busPIRone  (BUSPAR ) 5 MG tablet Take 1 tablet (5 mg total) by mouth 3 (three) times daily as needed.   dapagliflozin  propanediol (FARXIGA ) 10 MG TABS tablet Take 1 tablet (10 mg total) by mouth daily before breakfast.   DULoxetine  (CYMBALTA ) 30 MG capsule Take 1 capsule (30 mg total) by mouth daily.   glucose blood (GNP EASY TOUCH GLUCOSE TEST) test strip Check BS up to QID Dx E11.9   hydrochlorothiazide  (HYDRODIURIL ) 25 MG tablet TAKE ONE (1) TABLET BY MOUTH EVERY DAY   insulin  glargine, 1 Unit Dial, (TOUJEO  SOLOSTAR) 300 UNIT/ML Solostar Pen Inject 60 Units into the skin daily.   Insulin  Pen Needle (GNP ULTICARE PEN NEEDLES) 32G X 6 MM MISC Check BS up to QID Dx E11.9   Insulin  Pen Needle 29G X MISC Use with Insulin  twice daily Dx E11.69   levocetirizine (XYZAL ) 5 MG tablet Take 1 tablet (5 mg total) by mouth at bedtime  as needed (drainage).   levothyroxine  (SYNTHROID ) 200 MCG tablet Take 1 tablet (200 mcg total) by mouth daily before breakfast.   levothyroxine  (SYNTHROID ) 25 MCG tablet Take 1 tablet (25 mcg total) by mouth daily before breakfast.   meloxicam  (MOBIC ) 7.5 MG tablet Take 1 tablet (7.5 mg total) by mouth daily.   rosuvastatin  (CRESTOR ) 5 MG tablet Take 1 tablet (5 mg total) by mouth daily.   triamcinolone  ointment (KENALOG ) 0.5 % Apply 1 Application topically 2 (two) times daily.   TRUEplus Lancets 30G MISC Check BS up to QID Dx E11.9    No facility-administered encounter medications on file as of 03/28/2024.    Allergies (verified) Metformin  and related, Asa [aspirin], and Penicillins   History: Past Medical History:  Diagnosis Date   Anxiety    Depression    Hypertension    Thyroid  disease    Past Surgical History:  Procedure Laterality Date   CESAREAN SECTION     Family History  Problem Relation Age of Onset   Hypertension Mother    Thyroid  disease Mother    Osteoporosis Mother    Social History   Socioeconomic History   Marital status: Single    Spouse name: Not on file   Number of children: 1   Years of education: Not on file   Highest education level: Not on file  Occupational History   Occupation: disability since 2019  Tobacco Use   Smoking status: Never   Smokeless tobacco: Never  Vaping Use   Vaping status: Never Used  Substance and Sexual Activity   Alcohol use: No   Drug use: No   Sexual activity: Not on file  Other Topics Concern   Not on file  Social History Narrative   Son lives next door   Social Drivers of Health   Financial Resource Strain: Low Risk  (03/28/2024)   Overall Financial Resource Strain (CARDIA)    Difficulty of Paying Living Expenses: Not hard at all  Food Insecurity: No Food Insecurity (03/28/2024)   Hunger Vital Sign    Worried About Running Out of Food in the Last Year: Never true    Ran Out of Food in the Last Year: Never true  Transportation Needs: No Transportation Needs (03/28/2024)   PRAPARE - Administrator, Civil Service (Medical): No    Lack of Transportation (Non-Medical): No  Physical Activity: Sufficiently Active (03/28/2024)   Exercise Vital Sign    Days of Exercise per Week: 7 days    Minutes of Exercise per Session: 30 min  Stress: No Stress Concern Present (03/28/2024)   [employer] of Occupational Health - Occupational Stress Questionnaire    Feeling of Stress : Not at all  Social Connections: Moderately Isolated  (03/28/2023)   Social Connection and Isolation Panel [NHANES]    Frequency of Communication with Friends and Family: More than three times a week    Frequency of Social Gatherings with Friends and Family: More than three times a week    Attends Religious Services: More than 4 times per year    Active Member of [employer] or Organizations: No    Attends Banker Meetings: Never    Marital Status: Divorced    Tobacco Counseling Counseling given: Yes    Clinical Intake:  Pre-visit preparation completed: Yes  Pain : No/denies pain (have arthrititsis/r-knee acting up sometimes/use a kneww brace)     BMI - recorded: 42.76 Nutritional Status: BMI > 30  Obese Nutritional Risks: None  Diabetes: Yes CBG done?: Yes (135-140 this morning)  Lab Results  Component Value Date   HGBA1C 12.1 (H) 12/08/2023   HGBA1C 13.2 (H) 08/28/2023   HGBA1C 13.4 (H) 04/07/2023     How often do you need to have someone help you when you read instructions, pamphlets, or other written materials from your doctor or pharmacy?: 1 - Never  Interpreter Needed?: No  Information entered by :: Alia T/cma   Activities of Daily Living     03/28/2024    2:59 PM  In your present state of health, do you have any difficulty performing the following activities:  Hearing? 0  Vision? 0  Difficulty concentrating or making decisions? 1  Comment sometimes concentration is off a little  Walking or climbing stairs? 1  Comment get knee agrigavated/try to avoided it  Dressing or bathing? 0  Doing errands, shopping? 0  Preparing Food and eating ? N  Using the Toilet? N  In the past six months, have you accidently leaked urine? N  Do you have problems with loss of bowel control? N  Managing your Medications? N  Managing your Finances? N  Housekeeping or managing your Housekeeping? N    Patient Care Team: Yevette Hem, FNP as PCP - General (Family Medicine) Gaile Jourdain, Larene Pleasant, LCSW as Social Worker  (Licensed Clinical Social Worker)  Indicate any recent [employer] you may have received from other than [facility] providers in the past year (date may be approximate).     Assessment:    This is a routine wellness examination for Khylie.  Hearing/Vision screen Hearing Screening - Comments:: Pt denies hearing dif Vision Screening - Comments:: Pt use reading glasses   Goals Addressed             This Visit's Progress    Exercise 3x per week (30 min per time)   On track      Depression Screen     03/28/2024    3:12 PM 12/08/2023    3:09 PM 08/28/2023    3:44 PM 05/08/2023    2:24 PM 03/28/2023    3:26 PM 06/13/2022    3:00 PM 12/07/2021    5:22 PM  PHQ 2/9 Scores  PHQ - 2 Score 0 0 0 0 0 2 2  PHQ- 9 Score 1 0 0 2  8 7     Fall Risk     05/08/2023    2:23 PM 03/28/2023    3:24 PM 09/29/2021    4:35 PM 04/01/2021    3:32 PM 02/04/2020    4:00 PM  Fall Risk   Falls in the past year? 1 1 0 0 1  Number falls in past yr: 0 1 0  0  Injury with Fall? 1 1 0  0  Risk for fall due to :  History of fall(s);Impaired balance/gait;Orthopedic patient Orthopedic patient;Impaired balance/gait    Follow up Falls prevention discussed Education provided;Falls prevention discussed Falls prevention discussed      MEDICARE RISK AT HOME:  Medicare Risk at Home Any stairs in or around the home?: Yes If so, are there any without handrails?: Yes Home free of loose throw rugs in walkways, pet beds, electrical cords, etc?: Yes Adequate lighting in your home to reduce risk of falls?: Yes Life alert?: No Use of a cane, walker or w/c?: Yes (walker/cane) Grab bars in the bathroom?: Yes Shower chair or bench in shower?: Yes Elevated toilet seat or a handicapped toilet?: Yes  TIMED UP  AND GO:  Was the test performed?  no  Cognitive Function: 6CIT completed        03/28/2024    3:14 PM 03/28/2023    3:28 PM 09/29/2021    4:37 PM  6CIT Screen  What Year? 0 points 0 points 0 points  What  month? 0 points 0 points 0 points  What time? 0 points 0 points 0 points  Count back from 20 0 points 0 points 0 points  Months in reverse 0 points 0 points 0 points  Repeat phrase 0 points 0 points 0 points  Total Score 0 points 0 points 0 points    Immunizations Immunization History  Administered Date(s) Administered   PFIZER Comirnaty(Gray Top)Covid-19 Tri-Sucrose Vaccine 07/25/2020, 08/18/2020    Screening Tests Health Maintenance  Topic Date Due   OPHTHALMOLOGY EXAM  Never done   Zoster Vaccines- Shingrix (1 of 2) Never done   Pneumonia Vaccine 30+ Years old (1 of 2 - PCV) 08/27/2024 (Originally [birth year]01/1977)   Cervical Cancer Screening (HPV/Pap Cotest)  12/07/2024 (Originally 07/01/1988)   DTaP/Tdap/Td (1 - Tdap) 12/07/2024 (Originally 07/01/1977)   MAMMOGRAM  12/07/2024 (Originally 07/01/2008)   Fecal DNA (Cologuard)  12/07/2024 (Originally 07/02/2003)   COVID-19 Vaccine (3 - Pfizer risk series) 04/13/2025 (Originally 09/15/2020)   HEMOGLOBIN A1C  06/06/2024   INFLUENZA VACCINE  06/14/2024   Diabetic kidney evaluation - eGFR measurement  01/11/2025   Diabetic kidney evaluation - Urine ACR  01/11/2025   FOOT EXAM  01/11/2025   Medicare Annual Wellness (AWV)  03/28/2025   DEXA SCAN  Completed   Hepatitis C Screening  Completed   HIV Screening  Completed   HPV VACCINES  Aged Out   Meningococcal B Vaccine  Aged Out    Health Maintenance  Health Maintenance Due  Topic Date Due   OPHTHALMOLOGY EXAM  Never done   Zoster Vaccines- Shingrix (1 of 2) Never done   Health Maintenance Items Addressed: See Nurse Notes  Additional Screening:  Vision Screening: Recommended annual ophthalmology exams for early detection of glaucoma and other disorders of the eye.  Dental Screening: Recommended annual dental exams for proper oral hygiene  Community Resource Referral / Chronic Care Management: CRR required this visit?  No   CCM required this visit?  No   Plan:    I have  personally reviewed and noted the following in the patient's chart:   Medical and social history Use of alcohol, tobacco or illicit drugs  Current medications and supplements including opioid prescriptions. Patient is not currently taking opioid prescriptions. Functional ability and status Nutritional status Physical activity Advanced directives List of other physicians Hospitalizations, surgeries, and ER visits in previous 12 months Vitals Screenings to include cognitive, depression, and falls Referrals and appointments  In addition, I have reviewed and discussed with patient certain preventive protocols, quality metrics, and best practice recommendations. A written personalized care plan for preventive services as well as general preventive health recommendations were provided to patient.   Michaelle Adolphus, CMA   03/28/2024   After Visit Summary: (Declined) Due to this being a telephonic visit, with patients personalized plan was offered to patient but patient Declined AVS at this time   Notes: Please remember to get the following done: Shingles vaccine and Diabetic eye exam.

## 2024-03-28 NOTE — Patient Instructions (Signed)
 Cynthia Espinoza , Thank you for taking time out of your busy schedule to complete your Annual Wellness Visit with me. I enjoyed our conversation and look forward to speaking with you again next year. I, as well as your care team,  appreciate your ongoing commitment to your health goals. Please review the following plan we discussed and let me know if I can assist you in the future. Your Game plan/ To Do List    Follow up Visits: Next Medicare AWV with our clinical staff: Thursday, 03/31/25 at 1:10p.m.   Have you seen your provider in the last 6 months (3 months if uncontrolled diabetes)? Yes Next Office Visit with your provider: n/a  Clinician Recommendations:  Aim for 30 minutes of exercise or brisk walking, 6-8 glasses of water, and 5 servings of fruits and vegetables each day. Please remember to get the following done: Shingles vaccine and Diabetic eye exam. If you have any questions please contact our office at [phone].      This is a list of the screening recommended for you and due dates:  Health Maintenance  Topic Date Due   Eye exam for diabetics  Never done   Zoster (Shingles) Vaccine (1 of 2) Never done   Pneumonia Vaccine (1 of 2 - PCV) 08/27/2024*   Pap with HPV screening  12/07/2024*   DTaP/Tdap/Td vaccine (1 - Tdap) 12/07/2024*   Mammogram  12/07/2024*   Cologuard (Stool DNA test)  12/07/2024*   COVID-19 Vaccine (3 - Pfizer risk series) 04/13/2025*   Hemoglobin A1C  06/06/2024   Flu Shot  06/14/2024   Yearly kidney function blood test for diabetes  01/11/2025   Yearly kidney health urinalysis for diabetes  01/11/2025   Complete foot exam   01/11/2025   Medicare Annual Wellness Visit  03/28/2025   DEXA scan (bone density measurement)  Completed   Hepatitis C Screening  Completed   HIV Screening  Completed   HPV Vaccine  Aged Out   Meningitis B Vaccine  Aged Out  *Topic was postponed. The date shown is not the original due date.    Advanced directives: (Declined)  Advance directive discussed with you today. Even though you declined this today, please call our office should you change your mind, and we can give you the proper paperwork for you to fill out. Advance Care Planning is important because it:  [x]  Makes sure you receive the medical care that is consistent with your values, goals, and preferences  [x]  It provides guidance to your family and loved ones and reduces their decisional burden about whether or not they are making the right decisions based on your wishes.  Follow the link provided in your after visit summary or read over the paperwork we have mailed to you to help you started getting your Advance Directives in place. If you need assistance in completing these, please reach out to us  so that we can help you!  See attachments for Preventive Care and Fall Prevention Tips.

## 2024-03-29 ENCOUNTER — Other Ambulatory Visit: Payer: Self-pay | Admitting: Family

## 2024-04-19 ENCOUNTER — Telehealth: Payer: Self-pay | Admitting: Family

## 2024-04-26 ENCOUNTER — Other Ambulatory Visit: Payer: Self-pay | Admitting: Family

## 2024-04-26 DIAGNOSIS — E1169 Type 2 diabetes mellitus with other specified complication: Secondary | ICD-10-CM

## 2024-04-26 NOTE — Telephone Encounter (Signed)
 Copied from CRM [id number]. Topic: Clinical - Medication Refill >> Apr 26, 2024 11:56 AM Lizabeth Riggs wrote: Medication: insulin  glargine, 1 Unit Dial, (TOUJEO  SOLOSTAR) 300 UNIT/ML Solostar Pen   Has the patient contacted their pharmacy? Yes (Agent: If no, request that the patient contact the pharmacy for the refill. If patient does not wish to contact the pharmacy document the reason why and proceed with request.) (Agent: If yes, when and what did the pharmacy advise?) Pharmacy needs an order to refill  This is the patient's preferred pharmacy:  [facility] [place] - [address] [place] [address] Phone: [phone] Fax: [phone]  Is this the correct pharmacy for this prescription? Yes If no, delete pharmacy and type the correct one.   Has the prescription been filled recently? No  Is the patient out of the medication? Yes - She will be out of medication tomorrow.  Has the patient been seen for an appointment in the last year OR does the patient have an upcoming appointment? Yes  Can we respond through MyChart? No  Agent: Please be advised that Rx refills may take up to 3 business days. We ask that you follow-up with your pharmacy.

## 2024-04-26 NOTE — Telephone Encounter (Signed)
 Per chart review, medication was refilled today.

## 2024-05-23 ENCOUNTER — Other Ambulatory Visit: Payer: Self-pay | Admitting: Family

## 2024-05-23 DIAGNOSIS — E1169 Type 2 diabetes mellitus with other specified complication: Secondary | ICD-10-CM

## 2024-05-23 DIAGNOSIS — E039 Hypothyroidism, unspecified: Secondary | ICD-10-CM

## 2024-05-28 ENCOUNTER — Encounter: Payer: Self-pay | Admitting: Family

## 2024-05-28 ENCOUNTER — Ambulatory Visit: Admitting: Family

## 2024-05-28 VITALS — BP 117/65 | HR 79 | Temp 97.0°F | Ht 67.0 in | Wt 285.8 lb

## 2024-05-28 DIAGNOSIS — I10 Essential (primary) hypertension: Secondary | ICD-10-CM | POA: Diagnosis not present

## 2024-05-28 DIAGNOSIS — Z0000 Encounter for general adult medical examination without abnormal findings: Secondary | ICD-10-CM | POA: Diagnosis not present

## 2024-05-28 DIAGNOSIS — Z794 Long term (current) use of insulin: Secondary | ICD-10-CM | POA: Diagnosis not present

## 2024-05-28 DIAGNOSIS — Z0001 Encounter for general adult medical examination with abnormal findings: Secondary | ICD-10-CM

## 2024-05-28 DIAGNOSIS — E039 Hypothyroidism, unspecified: Secondary | ICD-10-CM

## 2024-05-28 DIAGNOSIS — M5442 Lumbago with sciatica, left side: Secondary | ICD-10-CM | POA: Diagnosis not present

## 2024-05-28 DIAGNOSIS — M5441 Lumbago with sciatica, right side: Secondary | ICD-10-CM

## 2024-05-28 DIAGNOSIS — M159 Polyosteoarthritis, unspecified: Secondary | ICD-10-CM | POA: Diagnosis not present

## 2024-05-28 DIAGNOSIS — G8929 Other chronic pain: Secondary | ICD-10-CM

## 2024-05-28 DIAGNOSIS — Z91199 Patient's noncompliance with other medical treatment and regimen due to unspecified reason: Secondary | ICD-10-CM | POA: Diagnosis not present

## 2024-05-28 DIAGNOSIS — E1169 Type 2 diabetes mellitus with other specified complication: Secondary | ICD-10-CM

## 2024-05-28 DIAGNOSIS — M791 Myalgia, unspecified site: Secondary | ICD-10-CM | POA: Diagnosis not present

## 2024-05-28 DIAGNOSIS — Z1211 Encounter for screening for malignant neoplasm of colon: Secondary | ICD-10-CM | POA: Diagnosis not present

## 2024-05-28 MED ORDER — LANCET DEVICE MISC
1.0000 | Freq: Three times a day (TID) | 0 refills | Status: AC
Start: 2024-05-28 — End: 2024-06-27

## 2024-05-28 MED ORDER — LANCETS MISC. MISC: 1.0000 | Freq: Three times a day (TID) | 0 refills | Status: AC

## 2024-05-28 MED ORDER — TOUJEO SOLOSTAR 300 UNIT/ML ~~LOC~~ SOPN: 60.0000 [IU] | PEN_INJECTOR | Freq: Every day | SUBCUTANEOUS | 1 refills | Status: DC

## 2024-05-28 MED ORDER — BLOOD GLUCOSE MONITORING SUPPL DEVI: 1.0000 | Freq: Three times a day (TID) | 0 refills | Status: AC

## 2024-05-28 MED ORDER — BLOOD GLUCOSE TEST VI STRP: 1.0000 | ORAL_STRIP | Freq: Three times a day (TID) | 0 refills | Status: DC

## 2024-05-28 NOTE — Patient Instructions (Signed)

## 2024-05-28 NOTE — Telephone Encounter (Unsigned)
 Copied from CRM [id number]. Topic: Clinical - Medication Question >> May 28, 2024  4:11 PM Jasmin G wrote: Reason for CRM: Pt. Would like to see if it's possible for her not to have to call every month to get prescription refills since she has to do that every month as of now. Please call her back ASAP.

## 2024-05-28 NOTE — Progress Notes (Signed)
 Subjective:    Patient ID: Cynthia Espinoza, female    DOB: 1956-06-21, 68 y.o.   MRN: [id number]  Chief Complaint  Patient presents with   Medical Management of Chronic Issues   Pt presents to the office today for CPE and chronic follow up and uncontrolled DM.   Pt's A1C was 12.1 on 12/08/23. We added Ozempic  0.25 mg in the past and farxiga ,  but stopped this because she had abdominal pain. Taking Toujeo  55 units.   She is morbid obese with a BMI of 44.   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      05/28/2024    3:25 PM 03/28/2024    2:51 PM 01/12/2024    2:36 PM  Last 3 Weights  Weight (lbs) 285 lb 12.8 oz 273 lb 273 lb  Weight (kg) 129.638 kg 123.832 kg 123.832 kg     Hypertension This is a chronic problem. The current episode started more than 1 year ago. The problem has been resolved since onset. The problem is controlled. Pertinent negatives include no blurred vision, malaise/fatigue, peripheral edema or shortness of breath. Risk factors for coronary artery disease include dyslipidemia, diabetes mellitus, obesity and sedentary lifestyle. The current treatment provides moderate improvement. Identifiable causes of hypertension include a thyroid  problem.  Thyroid  Problem Presents for follow-up visit. Symptoms include dry skin and hair loss. Patient reports no anxiety, cold intolerance, constipation or fatigue. The symptoms have been stable.  Hyperlipidemia This is a chronic problem. The current episode started more than 1 year ago. The problem is uncontrolled. Recent lipid tests were reviewed and are high. Exacerbating diseases include obesity. Pertinent negatives include no shortness of breath. Current antihyperlipidemic treatment includes diet change. The current treatment provides no improvement of lipids. Risk factors for coronary artery disease include diabetes mellitus, dyslipidemia, hypertension, a sedentary lifestyle and post-menopausal.  Diabetes She presents for her  follow-up diabetic visit. She has type 2 diabetes mellitus. Pertinent negatives for hypoglycemia include no nervousness/anxiousness. Pertinent negatives for diabetes include no blurred vision, no fatigue and no foot paresthesias. Pertinent negatives for diabetic complications include no peripheral neuropathy. Risk factors for coronary artery disease include dyslipidemia, diabetes mellitus, hypertension, sedentary lifestyle, post-menopausal and obesity. She is following a generally unhealthy diet. Her overall blood glucose range is >200 mg/dl.  Back Pain This is a chronic problem. The current episode started more than 1 year ago. The problem occurs intermittently. The problem has been waxing and waning since onset. The pain is present in the lumbar spine. The quality of the pain is described as aching. The pain is at a severity of 8/10. The pain is moderate. Risk factors include obesity. She has tried bed rest for the symptoms. The treatment provided moderate relief.  Arthritis Presents for follow-up visit. She complains of pain and stiffness. She reports no joint warmth. The symptoms have been stable. Affected locations include the left knee, right knee, left MCP, right MCP, left elbow, right elbow, left hip and right hip. Her pain is at a severity of 8/10. Pertinent negatives include no fatigue.      Review of Systems  Constitutional:  Negative for fatigue and malaise/fatigue.  Eyes:  Negative for blurred vision.  Respiratory:  Negative for shortness of breath.   Gastrointestinal:  Negative for constipation.  Endocrine: Negative for cold intolerance.  Musculoskeletal:  Positive for back pain and stiffness.  Psychiatric/Behavioral:  The patient is not nervous/anxious.   All other systems reviewed and are negative.  Family  History  Problem Relation Age of Onset   Hypertension Mother    Thyroid  disease Mother    Osteoporosis Mother    Social History   Socioeconomic History   Marital status:  Single    Spouse name: Not on file   Number of children: 1   Years of education: Not on file   Highest education level: Not on file  Occupational History   Occupation: disability since 2019  Tobacco Use   Smoking status: Never   Smokeless tobacco: Never  Vaping Use   Vaping status: Never Used  Substance and Sexual Activity   Alcohol use: No   Drug use: No   Sexual activity: Not on file  Other Topics Concern   Not on file  Social History Narrative   Son lives next door   Social Drivers of Health   Financial Resource Strain: Low Risk  (03/28/2024)   Overall Financial Resource Strain (CARDIA)    Difficulty of Paying Living Expenses: Not hard at all  Food Insecurity: No Food Insecurity (03/28/2024)   Hunger Vital Sign    Worried About Running Out of Food in the Last Year: Never true    Ran Out of Food in the Last Year: Never true  Transportation Needs: No Transportation Needs (03/28/2024)   PRAPARE - Administrator, Civil Service (Medical): No    Lack of Transportation (Non-Medical): No  Physical Activity: Sufficiently Active (03/28/2024)   Exercise Vital Sign    Days of Exercise per Week: 7 days    Minutes of Exercise per Session: 30 min  Stress: No Stress Concern Present (03/28/2024)   [employer] of Occupational Health - Occupational Stress Questionnaire    Feeling of Stress : Not at all  Social Connections: Moderately Isolated (03/28/2023)   Social Connection and Isolation Panel    Frequency of Communication with Friends and Family: More than three times a week    Frequency of Social Gatherings with Friends and Family: More than three times a week    Attends Religious Services: More than 4 times per year    Active Member of [employer] or Organizations: No    Attends Banker Meetings: Never    Marital Status: Divorced       Objective:   Physical Exam Vitals reviewed.  Constitutional:      General: She is not in acute distress.    Appearance:  She is well-developed. She is obese.  HENT:     Head: Normocephalic and atraumatic.     Right Ear: Tympanic membrane normal.     Left Ear: Tympanic membrane normal.  Eyes:     Pupils: Pupils are equal, round, and reactive to light.  Neck:     Thyroid : No thyromegaly.  Cardiovascular:     Rate and Rhythm: Normal rate and regular rhythm.     Heart sounds: Murmur heard.  Pulmonary:     Effort: Pulmonary effort is normal. No respiratory distress.     Breath sounds: Normal breath sounds. No wheezing.  Abdominal:     General: Bowel sounds are normal. There is no distension.     Palpations: Abdomen is soft.     Tenderness: There is no abdominal tenderness.  Musculoskeletal:        General: No tenderness. Normal range of motion.     Cervical back: Normal range of motion and neck supple.     Right lower leg: Edema (trace) present.     Left lower leg: Edema (  trace) present.  Skin:    General: Skin is warm and dry.     Findings: No rash.     Comments: Bilateral discoloration of lower leg  Neurological:     Mental Status: She is alert and oriented to person, place, and time.     Cranial Nerves: No cranial nerve deficit.     Deep Tendon Reflexes: Reflexes are normal and symmetric.  Psychiatric:        Behavior: Behavior normal.        Thought Content: Thought content normal.        Judgment: Judgment normal.        BP 117/65   Pulse 79   Temp (!) 97 F (36.1 C) (Temporal)   Ht 5' 7 (1.702 m)   Wt 285 lb 12.8 oz (129.6 kg)   SpO2 94%   BMI 44.76 kg/m   Assessment & Plan:   IYANAH DEMONT comes in today with chief complaint of Medical Management of Chronic Issues  1. Annual physical exam (Primary) - CMP14+EGFR - CBC with Differential/Platelet - Bayer DCA Hb A1c Waived - Lipid panel - TSH - Vitamin B12  2. Essential hypertension - CMP14+EGFR - CBC with Differential/Platelet - AMB Referral VBCI Care Management  3. Hypothyroidism, unspecified type - CMP14+EGFR -  CBC with Differential/Platelet - TSH  4. Generalized OA - CMP14+EGFR - CBC with Differential/Platelet  5. Chronic bilateral low back pain with bilateral sciatica  - CMP14+EGFR - CBC with Differential/Platelet  6. Morbid obesity (HCC) - CMP14+EGFR - CBC with Differential/Platelet  7. Type 2 diabetes mellitus with other specified complication, with long-term current use of insulin  (HCC) - CMP14+EGFR - CBC with Differential/Platelet - Bayer DCA Hb A1c Waived - Vitamin B12 - AMB Referral VBCI Care Management - Blood Glucose Monitoring Suppl DEVI; 1 each by Does not apply route in the morning, at noon, and at bedtime. May substitute to any manufacturer covered by patient's insurance.  Dispense: 1 each; Refill: 0 - Glucose Blood (BLOOD GLUCOSE TEST STRIPS) STRP; 1 each by In Vitro route in the morning, at noon, and at bedtime. May substitute to any manufacturer covered by patient's insurance.  Dispense: 100 strip; Refill: 0 - Lancet Device MISC; 1 each by Does not apply route in the morning, at noon, and at bedtime. May substitute to any manufacturer covered by patient's insurance.  Dispense: 1 each; Refill: 0 - Lancets Misc. MISC; 1 each by Does not apply route in the morning, at noon, and at bedtime. May substitute to any manufacturer covered by patient's insurance.  Dispense: 100 each; Refill: 0 - insulin  glargine, 1 Unit Dial, (TOUJEO  SOLOSTAR) 300 UNIT/ML Solostar Pen; Inject 60 Units into the skin daily.  Dispense: 6 mL; Refill: 1 - Hgb A1c w/o eAG  8. Hyperlipidemia associated with type 2 diabetes mellitus (HCC)  - CMP14+EGFR - CBC with Differential/Platelet - Lipid panel - AMB Referral VBCI Care Management  9. Noncompliance - CMP14+EGFR - CBC with Differential/Platelet - AMB Referral VBCI Care Management  10. Colon cancer screening - Cologuard  11. Myalgia due to statin    Labs pending Pt very noncompliant, glucose >200. Will place referral to Clinical Pharm to help  manage. If we can not control A1C will need to place referral to Endocrinologists.   Increase Toujeo  to to units from 55 units Strict low carb diet  Health Maintenance reviewed Diet and exercise encouraged  Follow up plan: 2  months   Bari Learn, FNP

## 2024-05-29 LAB — LIPID PANEL
Chol/HDL Ratio: 4.9 ratio — ABNORMAL HIGH (ref 0.0–4.4)
Cholesterol, Total: 195 mg/dL (ref 100–199)
HDL: 40 mg/dL (ref >39)
LDL Chol Calc (NIH): 93 mg/dL (ref 0–99)
Triglycerides: 377 mg/dL — ABNORMAL HIGH (ref 0–149)
VLDL Cholesterol Cal: 62 mg/dL — ABNORMAL HIGH (ref 5–40)

## 2024-05-29 LAB — CMP14+EGFR
ALT: 41 IU/L — ABNORMAL HIGH (ref 0–32)
AST: 32 IU/L (ref 0–40)
Albumin: 4 g/dL (ref 3.9–4.9)
Alkaline Phosphatase: 60 IU/L (ref 44–121)
BUN/Creatinine Ratio: 19 (ref 12–28)
BUN: 15 mg/dL (ref 8–27)
Bilirubin Total: 0.4 mg/dL (ref 0.0–1.2)
CO2: 18 mmol/L — ABNORMAL LOW (ref 20–29)
Calcium: 9.2 mg/dL (ref 8.7–10.3)
Chloride: 100 mmol/L (ref 96–106)
Creatinine, Ser: 0.8 mg/dL (ref 0.57–1.00)
Globulin, Total: 2.9 g/dL (ref 1.5–4.5)
Glucose: 317 mg/dL — ABNORMAL HIGH (ref 70–99)
Potassium: 4 mmol/L (ref 3.5–5.2)
Sodium: 137 mmol/L (ref 134–144)
Total Protein: 6.9 g/dL (ref 6.0–8.5)
eGFR: 82 mL/min/1.73 (ref >59)

## 2024-05-29 LAB — CBC WITH DIFFERENTIAL/PLATELET
Basophils Absolute: 0 x10E3/uL (ref 0.0–0.2)
Basos: 0 %
EOS (ABSOLUTE): 0.2 x10E3/uL (ref 0.0–0.4)
Eos: 2 %
Hematocrit: 42.1 % (ref 34.0–46.6)
Hemoglobin: 13.6 g/dL (ref 11.1–15.9)
Immature Grans (Abs): 0 x10E3/uL (ref 0.0–0.1)
Immature Granulocytes: 0 %
Lymphocytes Absolute: 3 x10E3/uL (ref 0.7–3.1)
Lymphs: 33 %
MCH: 30.9 pg (ref 26.6–33.0)
MCHC: 32.3 g/dL (ref 31.5–35.7)
MCV: 96 fL (ref 79–97)
Monocytes Absolute: 0.7 x10E3/uL (ref 0.1–0.9)
Monocytes: 8 %
Neutrophils Absolute: 5.1 x10E3/uL (ref 1.4–7.0)
Neutrophils: 57 %
Platelets: 220 x10E3/uL (ref 150–450)
RBC: 4.4 x10E6/uL (ref 3.77–5.28)
RDW: 12.3 % (ref 11.7–15.4)
WBC: 9.1 x10E3/uL (ref 3.4–10.8)

## 2024-05-29 LAB — VITAMIN B12: Vitamin B-12: 185 pg/mL — ABNORMAL LOW (ref 232–1245)

## 2024-05-29 LAB — TSH: TSH: 1.83 u[IU]/mL (ref 0.450–4.500)

## 2024-05-30 ENCOUNTER — Ambulatory Visit: Payer: Self-pay | Admitting: Family

## 2024-05-30 ENCOUNTER — Other Ambulatory Visit: Payer: Self-pay | Admitting: Family

## 2024-05-30 ENCOUNTER — Telehealth: Payer: Self-pay

## 2024-05-30 LAB — HGB A1C W/O EAG: Hgb A1c MFr Bld: 12.5 — AB (ref 4.8–5.6)

## 2024-05-30 LAB — SPECIMEN STATUS REPORT

## 2024-05-30 MED ORDER — EZETIMIBE 10 MG PO TABS: 10.0000 mg | ORAL_TABLET | Freq: Every day | ORAL | 1 refills | Status: DC

## 2024-05-30 NOTE — Telephone Encounter (Signed)
 Copied from CRM [id number]. Topic: Clinical - Lab/Test Results >> May 30, 2024  4:35 PM Delon DASEN wrote: Reason for CRM: returned call for lab results- no questions

## 2024-05-30 NOTE — Telephone Encounter (Signed)
 Result encounter updated

## 2024-06-05 ENCOUNTER — Telehealth: Payer: Self-pay

## 2024-06-05 NOTE — Progress Notes (Signed)
 Care Guide Pharmacy Note  06/05/2024 Name: Cynthia Espinoza MRN: [id number] DOB: 10-24-1956  Referred By: Lavell Bari LABOR, FNP Reason for referral: Complex Care Management (Outreach to schedule with Pharm d )   Cynthia Espinoza is a 68 y.o. year old female who is a primary care patient of Lavell Bari LABOR, FNP.  Cynthia Espinoza was referred to the pharmacist for assistance related to: DMII  Successful contact was made with the patient to discuss pharmacy services including being ready for the pharmacist to call at least 5 minutes before the scheduled appointment time and to have medication bottles and any blood pressure readings ready for review. The patient agreed to meet with the pharmacist via telephone visit on (date/time).06/28/2024  Jeoffrey Buffalo , RMA     [place]  [facility], [facility] Guide  Direct Dial: [phone]  Website: [email or web address]

## 2024-06-28 ENCOUNTER — Other Ambulatory Visit (INDEPENDENT_AMBULATORY_CARE_PROVIDER_SITE_OTHER)

## 2024-06-28 DIAGNOSIS — E119 Type 2 diabetes mellitus without complications: Secondary | ICD-10-CM

## 2024-06-28 DIAGNOSIS — Z794 Long term (current) use of insulin: Secondary | ICD-10-CM

## 2024-06-28 NOTE — Progress Notes (Signed)
 06/28/2024 Name: Cynthia Espinoza MRN: [id number] DOB: Feb 18, 1956  Chief Complaint  Patient presents with   Diabetes    Cynthia Espinoza is a 68 y.o. year old female who presented for a telephone visit.  I connected with  [name] on 06/28/24 by telephone and verified that I am speaking with the correct person using two identifiers.  I discussed the limitations of evaluation and management by telemedicine. The patient expressed understanding and agreed to proceed.  Patient was located in her home and PharmD in PCP office during this visit.   They were referred to the pharmacist by their PCP for assistance in managing diabetes.    Subjective:  Patient living with type 2 diabetes for about 5 years.  She was diagnosed with T2DM at 68yo.  She reports she is trying hard to improve diet and lifestyle.  She has respectfully declined other medications to treat t2dm in the past.  Care Team: Primary Care Provider: Lavell Bari LABOR, FNP    Medication Access/Adherence  Current [facility]:  [facility], [place] - [address] [place] [address] Phone: [phone] Fax: [phone]  Patient reports affordability concerns with their medications: No  Patient reports access/transportation concerns to their pharmacy: No  Patient reports adherence concerns with their medications:  No     Diabetes:  Current medications: Toujeo 60 units daily Taking variable doses of Toujeo - usually 32 units (but ranging from 30-35 units). Current Rx is for 30 units daily. Was hesitant to try once weekly injectable last year - had cousin that had numbness on Ozempic. Is not interested in trying an alternative GLP-1 like Mounjaro or Trulicity - she wants to see how the Toujeo works first. Stopped taking her statin but does not remember why (possible GI issues)- wants to work on lifestyle first.   Medications tried in the past: dapagliflozin 5/10 mg (had heart palpitations),  canagliflozin, glipizide, NPH insulin, metformin (GI upset)   Current glucose readings: this AM low 200s Denies BG <80 mg/dL in the AM.   Using BG meter; testing one times daily, fasting. Occasionally will check BG at night if she had dietary excursions.   Current meal patterns: - Supper: Has been eating more chicken and chopped salads, trying to cut back red meat.  - Drinks: 6-7 bottles water/day. Sometimes adds gatorade to her water or pur leaf sweet tea.  Patient denies hypoglycemic s/sx including dizziness, shakiness, sweating. Patient denies hyperglycemic symptoms including polyuria, polydipsia, polyphagia, nocturia, neuropathy, blurred vision.  Current meal patterns: - Supper: Has been eating more chicken and chopped salads, trying to cut back red meat.  - Drinks: 6-7 bottles water/day. Sometimes adds gatorade to her water or pur leaf sweet tea. Premier protein shakes  Current physical activity: increasing;walking  Current medication access support: dual complete medicare   Objective:  Lab Results  Component Value Date   HGBA1C 12.5 (H) 05/28/2024    Lab Results  Component Value Date   CREATININE 0.80 05/28/2024   BUN 15 05/28/2024   NA 137 05/28/2024   K 4.0 05/28/2024   CL 100 05/28/2024   CO2 18 (L) 05/28/2024    Lab Results  Component Value Date   CHOL 195 05/28/2024   HDL 40 05/28/2024   LDLCALC 93 05/28/2024   TRIG 377 (H) 05/28/2024   CHOLHDL 4.9 (H) 05/28/2024    Medications Reviewed Today     Reviewed by Billee Mliss BIRCH, [facility] (Pharmacist)  on 06/28/24 at 1546  Med List Status: <None>   Medication Order Taking? Sig Documenting Provider Last Dose Status Informant  amLODipine (NORVASC) 10 MG tablet [id number]  TAKE ONE (1) TABLET EACH DAY (NEEDS TO BE SEEN BEFORE NEXT REFILL) Lavell Lye A, FNP  Active   benazepril (LOTENSIN) 40 MG tablet [id number]  TAKE ONE (1) TABLET EACH DAY (NEEDS TO BE SEEN BEFORE NEXT REFILL) Lavell Lye LABOR, FNP  Active    blood glucose meter kit and supplies [id number]  Dispense based on patient and insurance preference. Use up to four times daily as directed. (FOR ICD-10 E10.9, E11.9). Lavell Lye LABOR, FNP  Active   Blood Glucose Monitoring Suppl DEVI [id number]  1 each by Does not apply route in the morning, at noon, and at bedtime. May substitute to any manufacturer covered by patient's insurance. Lavell Lye A, FNP  Active   busPIRone (BUSPAR) 5 MG tablet [id number]  Take 1 tablet (5 mg total) by mouth 3 (three) times daily as needed. Lavell Lye LABOR, FNP  Active            Med Note DELSA LORAIN SQUIBB   Tue Jun 27, 2023  2:27 PM) Taking PRN  DULoxetine (CYMBALTA) 30 MG capsule [id number]  Take 1 capsule (30 mg total) by mouth daily. Lavell Lye A, FNP  Active   ezetimibe (ZETIA) 10 MG tablet [id number]  Take 1 tablet (10 mg total) by mouth daily.  Patient not taking: Reported on 06/28/2024   Lavell Lye A, FNP  Active   hydrochlorothiazide (HYDRODIURIL) 25 MG tablet [id number]  TAKE ONE (1) TABLET BY MOUTH EVERY DAY Hawks, Christy A, FNP  Active   insulin glargine, 1 Unit Dial, (TOUJEO SOLOSTAR) 300 UNIT/ML Solostar Pen [id number]  Inject 60 Units into the skin daily. Lavell Lye A, FNP  Active   Insulin Pen Needle (GNP ULTICARE PEN NEEDLES) 32G X 6 MM MISC [id number]  Check BS up to QID Dx E11.9 Lavell Lye LABOR, FNP  Active   Insulin Pen Needle 29G X MISC [id number]  Use with Insulin twice daily Dx E11.69 Lavell Lye A, FNP  Active   levocetirizine (XYZAL) 5 MG tablet [id number]  Take 1 tablet (5 mg total) by mouth at bedtime as needed (drainage). Jolinda Potter M, DO  Active   levothyroxine (SYNTHROID) 200 MCG tablet [id number]  TAKE 1 TABLET BY MOUTH EVERY MORNING BEFORE BREAKFAST Lavell Lye A, FNP  Active   levothyroxine (SYNTHROID) 25 MCG tablet [id number]  TAKE 1 TABLET BY MOUTH EVERY MORNING BEFORE BREAKFAST Hawks, Christy A, FNP  Active   meloxicam (MOBIC) 7.5 MG tablet [id number]  Take 1  tablet (7.5 mg total) by mouth daily. Lavell Lye A, FNP  Active   triamcinolone ointment (KENALOG) 0.5 % [id number]  Apply 1 Application topically 2 (two) times daily. Lavell Lye LABOR, FNP  Active   TRUEplus Lancets 30G MISC [id number]  Check BS up to QID Dx E11.9 Lavell Lye A, FNP  Active            Assessment/Plan:   Diabetes: - Currently uncontrolled with last A1c of 12.5% on 05/28/24 - pt states she has been taking Toujeo (insulin glargine) 60 units daily now. BG improved per patient reported fasting BG, though suspect that post-prandial BG are likely elevated. Great candidate for GLP-1 or GLP-1/GIP RA with metabolic syndrome and BMI >45, however pt would like to think about it and try harder with diet/lifestyle.  She is more open to trying Mounjaro  now (does not want to start Ozempic due to family with allergy) Continued to discuss progressive nature of T2DM and likelihood of needing meal-time insulin to control BG. Also good candidate for CGM, but respectfully declined. Discussed importance of statin therapy and limited influence of diet on LDL-C.  - Reviewed long term cardiovascular and renal outcomes of uncontrolled blood sugar - Reviewed goal A1c, goal fasting, and goal 2 hour post prandial glucose - Reviewed dietary modifications including reducing intake of carbohydrate heavy foods - Reviewed lifestyle modifications including: increasing physical activity as able - Recommend to increase insulin glargine (Toujeo U300) 62-64 units every morning  - Patient has refused statin and ezetimibe ideally needs high-intensity statin - Recommend to check glucose once daily fasting and 2 hour post-prandially   Follow Up Plan: PCP in September 2025 45 min of patient care was provided to the patient during this visit time    Mliss Tarry Griffin, PharmD, BCACP, CPP Clinical Pharmacist, [facility]

## 2024-07-08 ENCOUNTER — Other Ambulatory Visit: Payer: Self-pay | Admitting: Family

## 2024-07-08 DIAGNOSIS — Z794 Long term (current) use of insulin: Secondary | ICD-10-CM

## 2024-07-24 LAB — COLOGUARD

## 2024-07-30 ENCOUNTER — Encounter: Payer: Self-pay | Admitting: Family

## 2024-07-30 ENCOUNTER — Ambulatory Visit: Admitting: Family

## 2024-07-30 VITALS — BP 123/78 | HR 80 | Temp 98.0°F | Ht 67.0 in | Wt 284.0 lb

## 2024-07-30 DIAGNOSIS — I872 Venous insufficiency (chronic) (peripheral): Secondary | ICD-10-CM | POA: Diagnosis not present

## 2024-07-30 DIAGNOSIS — R531 Weakness: Secondary | ICD-10-CM

## 2024-07-30 DIAGNOSIS — G8929 Other chronic pain: Secondary | ICD-10-CM

## 2024-07-30 DIAGNOSIS — M791 Myalgia, unspecified site: Secondary | ICD-10-CM

## 2024-07-30 DIAGNOSIS — M5442 Lumbago with sciatica, left side: Secondary | ICD-10-CM

## 2024-07-30 DIAGNOSIS — Z794 Long term (current) use of insulin: Secondary | ICD-10-CM

## 2024-07-30 DIAGNOSIS — E039 Hypothyroidism, unspecified: Secondary | ICD-10-CM | POA: Diagnosis not present

## 2024-07-30 DIAGNOSIS — E1169 Type 2 diabetes mellitus with other specified complication: Secondary | ICD-10-CM

## 2024-07-30 DIAGNOSIS — M5441 Lumbago with sciatica, right side: Secondary | ICD-10-CM

## 2024-07-30 DIAGNOSIS — M159 Polyosteoarthritis, unspecified: Secondary | ICD-10-CM

## 2024-07-30 DIAGNOSIS — Z91199 Patient's noncompliance with other medical treatment and regimen due to unspecified reason: Secondary | ICD-10-CM | POA: Diagnosis not present

## 2024-07-30 DIAGNOSIS — I10 Essential (primary) hypertension: Secondary | ICD-10-CM | POA: Diagnosis not present

## 2024-07-30 LAB — BAYER DCA HB A1C WAIVED: HB A1C (BAYER DCA - WAIVED): 9.9 % — ABNORMAL HIGH (ref 4.8–5.6)

## 2024-07-30 MED ORDER — TOUJEO MAX SOLOSTAR 300 UNIT/ML ~~LOC~~ SOPN: 65.0000 [IU] | PEN_INJECTOR | Freq: Every day | SUBCUTANEOUS | 2 refills | Status: DC

## 2024-07-30 NOTE — Patient Instructions (Signed)

## 2024-07-30 NOTE — Progress Notes (Signed)
 Subjective:    Patient ID: Cynthia Espinoza, female    DOB: 08-27-1956, 68 y.o.   MRN: [id number]  Chief Complaint  Patient presents with   2 MONTH FOLLOW UP   Pt presents to the office today for chronic follow up and uncontrolled DM.   Pt's A1C was 12.5 on 05/28/24. We added Ozempic  0.25 mg in the past and farxiga ,  but stopped this because she had abdominal pain. Taking Toujeo  60 units.  She is followed by Clinical Pharmacists.   She is morbid obese with a BMI of 44.   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      07/30/2024    3:00 PM 05/28/2024    3:25 PM 03/28/2024    2:51 PM  Last 3 Weights  Weight (lbs) 284 lb 285 lb 12.8 oz 273 lb  Weight (kg) 128.822 kg 129.638 kg 123.832 kg     Hypertension This is a chronic problem. The current episode started more than 1 year ago. The problem has been resolved since onset. The problem is controlled. Pertinent negatives include no blurred vision, malaise/fatigue, peripheral edema or shortness of breath. Risk factors for coronary artery disease include dyslipidemia, diabetes mellitus, obesity and sedentary lifestyle. The current treatment provides moderate improvement. Identifiable causes of hypertension include a thyroid  problem.  Thyroid  Problem Presents for follow-up visit. Symptoms include dry skin and hair loss. Patient reports no anxiety, cold intolerance, constipation or fatigue. The symptoms have been stable.  Hyperlipidemia This is a chronic problem. The current episode started more than 1 year ago. The problem is uncontrolled. Recent lipid tests were reviewed and are normal. Exacerbating diseases include obesity. Pertinent negatives include no shortness of breath. Current antihyperlipidemic treatment includes diet change. The current treatment provides no improvement of lipids. Risk factors for coronary artery disease include diabetes mellitus, dyslipidemia, hypertension, a sedentary lifestyle and post-menopausal.  Diabetes She  presents for her follow-up diabetic visit. She has type 2 diabetes mellitus. Pertinent negatives for hypoglycemia include no nervousness/anxiousness. Pertinent negatives for diabetes include no blurred vision, no fatigue and no foot paresthesias. Pertinent negatives for diabetic complications include no peripheral neuropathy. Risk factors for coronary artery disease include dyslipidemia, diabetes mellitus, hypertension, sedentary lifestyle, post-menopausal and obesity. She is following a generally unhealthy diet. Her overall blood glucose range is >200 mg/dl.  Back Pain This is a chronic problem. The current episode started more than 1 year ago. The problem occurs intermittently. The problem has been waxing and waning since onset. The pain is present in the lumbar spine. The quality of the pain is described as aching. The pain is at a severity of 6/10. The pain is moderate. Risk factors include obesity. She has tried bed rest for the symptoms. The treatment provided moderate relief.  Arthritis Presents for follow-up visit. She complains of pain and stiffness. She reports no joint warmth. The symptoms have been stable. Affected locations include the left knee, right knee, left MCP, right MCP, left elbow, right elbow, left hip and right hip. Her pain is at a severity of 8/10. Pertinent negatives include no fatigue.      Review of Systems  Constitutional:  Negative for fatigue and malaise/fatigue.  Eyes:  Negative for blurred vision.  Respiratory:  Negative for shortness of breath.   Gastrointestinal:  Negative for constipation.  Endocrine: Negative for cold intolerance.  Musculoskeletal:  Positive for back pain and stiffness.  Psychiatric/Behavioral:  The patient is not nervous/anxious.   All other systems reviewed and  are negative.  Family History  Problem Relation Age of Onset   Hypertension Mother    Thyroid  disease Mother    Osteoporosis Mother    Social History   Socioeconomic History    Marital status: Single    Spouse name: Not on file   Number of children: 1   Years of education: Not on file   Highest education level: Not on file  Occupational History   Occupation: disability since 2019  Tobacco Use   Smoking status: Never   Smokeless tobacco: Never  Vaping Use   Vaping status: Never Used  Substance and Sexual Activity   Alcohol use: No   Drug use: No   Sexual activity: Not on file  Other Topics Concern   Not on file  Social History Narrative   Son lives next door   Social Drivers of Health   Financial Resource Strain: Low Risk  (03/28/2024)   Overall Financial Resource Strain (CARDIA)    Difficulty of Paying Living Expenses: Not hard at all  Food Insecurity: No Food Insecurity (03/28/2024)   Hunger Vital Sign    Worried About Running Out of Food in the Last Year: Never true    Ran Out of Food in the Last Year: Never true  Transportation Needs: No Transportation Needs (03/28/2024)   PRAPARE - Administrator, Civil Service (Medical): No    Lack of Transportation (Non-Medical): No  Physical Activity: Sufficiently Active (03/28/2024)   Exercise Vital Sign    Days of Exercise per Week: 7 days    Minutes of Exercise per Session: 30 min  Stress: No Stress Concern Present (03/28/2024)   [employer] of Occupational Health - Occupational Stress Questionnaire    Feeling of Stress : Not at all  Social Connections: Moderately Isolated (03/28/2023)   Social Connection and Isolation Panel    Frequency of Communication with Friends and Family: More than three times a week    Frequency of Social Gatherings with Friends and Family: More than three times a week    Attends Religious Services: More than 4 times per year    Active Member of [employer] or Organizations: No    Attends Banker Meetings: Never    Marital Status: Divorced       Objective:   Physical Exam Vitals reviewed.  Constitutional:      General: She is not in acute  distress.    Appearance: She is well-developed. She is obese.  HENT:     Head: Normocephalic and atraumatic.     Right Ear: Tympanic membrane normal.     Left Ear: Tympanic membrane normal.  Eyes:     Pupils: Pupils are equal, round, and reactive to light.  Neck:     Thyroid : No thyromegaly.  Cardiovascular:     Rate and Rhythm: Normal rate and regular rhythm.     Heart sounds: Murmur heard.  Pulmonary:     Effort: Pulmonary effort is normal. No respiratory distress.     Breath sounds: Normal breath sounds. No wheezing.  Abdominal:     General: Bowel sounds are normal. There is no distension.     Palpations: Abdomen is soft.     Tenderness: There is no abdominal tenderness.  Musculoskeletal:        General: No tenderness. Normal range of motion.     Cervical back: Normal range of motion and neck supple.     Right lower leg: Edema (trace) present.  Left lower leg: Edema (trace) present.  Skin:    General: Skin is warm and dry.     Findings: No rash.     Comments: Bilateral discoloration of lower leg  Neurological:     Mental Status: She is alert and oriented to person, place, and time.     Cranial Nerves: No cranial nerve deficit.     Deep Tendon Reflexes: Reflexes are normal and symmetric.  Psychiatric:        Behavior: Behavior normal.        Thought Content: Thought content normal.        Judgment: Judgment normal.        BP 123/78   Pulse 80   Temp 98 F (36.7 C)   Ht 5' 7 (1.702 m)   Wt 284 lb (128.8 kg)   SpO2 96%   BMI 44.48 kg/m   Assessment & Plan:   PURVI RUEHL comes in today with chief complaint of 2 MONTH FOLLOW UP  1. Essential hypertension - CMP14+EGFR  2. Hypothyroidism, unspecified type - CMP14+EGFR  3. Generalized OA - CMP14+EGFR  4. Chronic bilateral low back pain with bilateral sciatica - CMP14+EGFR  5. Morbid obesity (HCC)  - CMP14+EGFR - For home use only DME 4 wheeled rolling walker with seat ([id number])  6. Type 2  diabetes mellitus with other specified complication, with long-term current use of insulin  (HCC) (Primary) Will increase Toujeo  to 65 units from 60 units Ok to increase 3 units every 3 days if glucose >150  Strict low carb Discussed Mounjaro, pt wants to wait on this.  Reports she is walking twice a day - CMP14+EGFR - Bayer DCA Hb A1c Waived - insulin  glargine, 2 Unit Dial, (TOUJEO  MAX SOLOSTAR) 300 UNIT/ML Solostar Pen; Inject 65 Units into the skin at bedtime.  Dispense: 9 mL; Refill: 2  7. Hyperlipidemia associated with type 2 diabetes mellitus (HCC) - CMP14+EGFR  8. Noncompliance - CMP14+EGFR  9. Myalgia due to statin - CMP14+EGFR  10. Venous insufficiency - CMP14+EGFR  11. Weakness - For home use only DME 4 wheeled rolling walker with seat ([id number])    Labs pending Pt very noncompliant, glucose >200. Will place referral to Clinical Pharm to help manage. If we can not control A1C will need to place referral to Endocrinologists.   Increase Toujeo  to to units from 65 units Strict low carb diet  Health Maintenance reviewed Diet and exercise encouraged  Follow up plan: 2  months   Bari Learn, FNP

## 2024-07-31 LAB — CMP14+EGFR
ALT: 46 IU/L — ABNORMAL HIGH (ref 0–32)
AST: 45 IU/L — ABNORMAL HIGH (ref 0–40)
Albumin: 4.2 g/dL (ref 3.9–4.9)
Alkaline Phosphatase: 59 IU/L (ref 49–135)
BUN/Creatinine Ratio: 17 (ref 12–28)
BUN: 13 mg/dL (ref 8–27)
Bilirubin Total: 0.3 mg/dL (ref 0.0–1.2)
CO2: 21 mmol/L (ref 20–29)
Calcium: 9.5 mg/dL (ref 8.7–10.3)
Chloride: 101 mmol/L (ref 96–106)
Creatinine, Ser: 0.75 mg/dL (ref 0.57–1.00)
Globulin, Total: 3.1 g/dL (ref 1.5–4.5)
Glucose: 247 mg/dL — ABNORMAL HIGH (ref 70–99)
Potassium: 4.1 mmol/L (ref 3.5–5.2)
Sodium: 139 mmol/L (ref 134–144)
Total Protein: 7.3 g/dL (ref 6.0–8.5)
eGFR: 88 mL/min/1.73 (ref >59)

## 2024-08-01 ENCOUNTER — Ambulatory Visit: Payer: Self-pay | Admitting: Family

## 2024-08-01 ENCOUNTER — Other Ambulatory Visit: Payer: Self-pay | Admitting: Family

## 2024-08-01 DIAGNOSIS — Z794 Long term (current) use of insulin: Secondary | ICD-10-CM

## 2024-08-01 DIAGNOSIS — Z91199 Patient's noncompliance with other medical treatment and regimen due to unspecified reason: Secondary | ICD-10-CM

## 2024-08-20 ENCOUNTER — Other Ambulatory Visit: Payer: Self-pay | Admitting: Family

## 2024-08-20 DIAGNOSIS — E1169 Type 2 diabetes mellitus with other specified complication: Secondary | ICD-10-CM

## 2024-08-20 DIAGNOSIS — E039 Hypothyroidism, unspecified: Secondary | ICD-10-CM

## 2024-08-20 DIAGNOSIS — I10 Essential (primary) hypertension: Secondary | ICD-10-CM

## 2024-08-21 ENCOUNTER — Other Ambulatory Visit: Payer: Self-pay | Admitting: *Deleted

## 2024-08-21 MED ORDER — ACCU-CHEK SOFTCLIX LANCETS MISC: 3 refills | Status: AC

## 2024-09-03 NOTE — Progress Notes (Signed)
 Cynthia Espinoza                                          MRN: [id number]   09/03/2024   The [facility] Quality Team Specialist reviewed this patient medical record for the purposes of chart review for care gap closure. The following were reviewed: chart review for care gap closure-glycemic status assessment.    [facility] Quality Team

## 2024-09-30 NOTE — Progress Notes (Addendum)
 Cynthia Espinoza                                          MRN: [id number]   09/30/2024   The [facility] Quality Team Specialist reviewed this patient medical record for the purposes of chart review for care gap closure. The following were reviewed: chart review for care gap closure-glycemic status assessment. Checked most recent labs for A1C, GSD measure, but labs are out of range.    [facility] Quality Team

## 2024-10-04 ENCOUNTER — Encounter: Payer: Self-pay | Admitting: Family

## 2024-10-04 ENCOUNTER — Ambulatory Visit (INDEPENDENT_AMBULATORY_CARE_PROVIDER_SITE_OTHER): Payer: Self-pay | Admitting: Family

## 2024-10-04 ENCOUNTER — Telehealth: Payer: Self-pay | Admitting: Pharmacist

## 2024-10-04 VITALS — BP 126/76 | HR 80 | Temp 99.1°F | Ht 67.0 in | Wt 273.0 lb

## 2024-10-04 DIAGNOSIS — E1169 Type 2 diabetes mellitus with other specified complication: Secondary | ICD-10-CM

## 2024-10-04 DIAGNOSIS — F3341 Major depressive disorder, recurrent, in partial remission: Secondary | ICD-10-CM

## 2024-10-04 DIAGNOSIS — I10 Essential (primary) hypertension: Secondary | ICD-10-CM | POA: Diagnosis not present

## 2024-10-04 DIAGNOSIS — M5441 Lumbago with sciatica, right side: Secondary | ICD-10-CM

## 2024-10-04 DIAGNOSIS — G8929 Other chronic pain: Secondary | ICD-10-CM

## 2024-10-04 DIAGNOSIS — E785 Hyperlipidemia, unspecified: Secondary | ICD-10-CM

## 2024-10-04 DIAGNOSIS — M159 Polyosteoarthritis, unspecified: Secondary | ICD-10-CM

## 2024-10-04 DIAGNOSIS — E039 Hypothyroidism, unspecified: Secondary | ICD-10-CM

## 2024-10-04 DIAGNOSIS — T466X5A Adverse effect of antihyperlipidemic and antiarteriosclerotic drugs, initial encounter: Secondary | ICD-10-CM

## 2024-10-04 DIAGNOSIS — Z794 Long term (current) use of insulin: Secondary | ICD-10-CM

## 2024-10-04 DIAGNOSIS — Z91199 Patient's noncompliance with other medical treatment and regimen due to unspecified reason: Secondary | ICD-10-CM

## 2024-10-04 DIAGNOSIS — M791 Myalgia, unspecified site: Secondary | ICD-10-CM

## 2024-10-04 DIAGNOSIS — M5442 Lumbago with sciatica, left side: Secondary | ICD-10-CM

## 2024-10-04 LAB — BAYER DCA HB A1C WAIVED: HB A1C (BAYER DCA - WAIVED): 10 % — ABNORMAL HIGH (ref 4.8–5.6)

## 2024-10-04 MED ORDER — TOUJEO MAX SOLOSTAR 300 UNIT/ML ~~LOC~~ SOPN
65.0000 [IU] | PEN_INJECTOR | Freq: Every day | SUBCUTANEOUS | 2 refills | Status: DC
Start: 2024-10-04 — End: 2024-10-04

## 2024-10-04 MED ORDER — MELOXICAM 7.5 MG PO TABS: 7.5000 mg | ORAL_TABLET | Freq: Every day | ORAL | 3 refills | Status: AC

## 2024-10-04 MED ORDER — METFORMIN HCL ER 500 MG PO TB24: 500.0000 mg | ORAL_TABLET | Freq: Every day | ORAL | 1 refills | Status: AC

## 2024-10-04 MED ORDER — AMLODIPINE BESYLATE 10 MG PO TABS: 10.0000 mg | ORAL_TABLET | Freq: Every day | ORAL | 2 refills | Status: AC

## 2024-10-04 MED ORDER — HYDROCHLOROTHIAZIDE 25 MG PO TABS: 25.0000 mg | ORAL_TABLET | Freq: Every day | ORAL | 2 refills | Status: AC

## 2024-10-04 MED ORDER — TOUJEO MAX SOLOSTAR 300 UNIT/ML ~~LOC~~ SOPN: 68.0000 [IU] | PEN_INJECTOR | Freq: Every day | SUBCUTANEOUS | 2 refills | Status: DC

## 2024-10-04 MED ORDER — BENAZEPRIL HCL 40 MG PO TABS: 40.0000 mg | ORAL_TABLET | Freq: Every day | ORAL | 2 refills | Status: AC

## 2024-10-04 NOTE — Patient Instructions (Signed)

## 2024-10-04 NOTE — Progress Notes (Signed)
 Subjective:    Patient ID: Cynthia Espinoza, female    DOB: 19-Jan-1956, 68 y.o.   MRN: [id number]  Chief Complaint  Patient presents with   Diabetes   Pt presents to the office today for chronic follow up and uncontrolled DM.   Pt's A1C was 9.9 on 07/30/24. We added Ozempic  0.25 mg in the past and farxiga ,  but stopped this because she had abdominal pain. Taking Toujeo  64 units.  She is followed by Clinical Pharmacists.   She is morbid obese with a BMI of 42. She has lost 11 lb since our last visit.   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      10/04/2024    3:16 PM 07/30/2024    3:00 PM 05/28/2024    3:25 PM  Last 3 Weights  Weight (lbs) 273 lb 284 lb 285 lb 12.8 oz  Weight (kg) 123.832 kg 128.822 kg 129.638 kg     Hypertension This is a chronic problem. The current episode started more than 1 year ago. The problem has been resolved since onset. The problem is controlled. Pertinent negatives include no blurred vision, malaise/fatigue, peripheral edema or shortness of breath. Risk factors for coronary artery disease include dyslipidemia, diabetes mellitus, obesity, sedentary lifestyle and post-menopausal state. The current treatment provides moderate improvement. Identifiable causes of hypertension include a thyroid  problem.  Thyroid  Problem Presents for follow-up visit. Symptoms include dry skin and hair loss. Patient reports no anxiety, cold intolerance, constipation or fatigue. The symptoms have been stable.  Hyperlipidemia This is a chronic problem. The current episode started more than 1 year ago. The problem is uncontrolled. Recent lipid tests were reviewed and are normal. Exacerbating diseases include obesity. Associated symptoms include leg pain. Pertinent negatives include no shortness of breath. Current antihyperlipidemic treatment includes diet change. The current treatment provides no improvement of lipids. Risk factors for coronary artery disease include diabetes  mellitus, dyslipidemia, hypertension, a sedentary lifestyle and post-menopausal.  Diabetes She presents for her follow-up diabetic visit. She has type 2 diabetes mellitus. Pertinent negatives for hypoglycemia include no nervousness/anxiousness. Pertinent negatives for diabetes include no blurred vision, no fatigue and no foot paresthesias. Pertinent negatives for diabetic complications include no peripheral neuropathy. Risk factors for coronary artery disease include dyslipidemia, diabetes mellitus, hypertension, sedentary lifestyle, post-menopausal and obesity. She is following a generally unhealthy diet. Her overall blood glucose range is >200 mg/dl.  Back Pain This is a chronic problem. The current episode started more than 1 year ago. The problem occurs intermittently. The problem has been waxing and waning since onset. The pain is present in the lumbar spine. The quality of the pain is described as aching. The pain is at a severity of 9/10. The pain is moderate. Associated symptoms include leg pain. Risk factors include obesity. She has tried bed rest for the symptoms. The treatment provided moderate relief.  Arthritis Presents for follow-up visit. She complains of pain and stiffness. She reports no joint warmth. The symptoms have been stable. Affected locations include the left knee, right knee, left MCP, right MCP, left elbow, right elbow, left hip and right hip. Her pain is at a severity of 9/10. Pertinent negatives include no fatigue.      Review of Systems  Constitutional:  Negative for fatigue and malaise/fatigue.  Eyes:  Negative for blurred vision.  Respiratory:  Negative for shortness of breath.   Gastrointestinal:  Negative for constipation.  Endocrine: Negative for cold intolerance.  Musculoskeletal:  Positive for back  pain and stiffness.  Psychiatric/Behavioral:  The patient is not nervous/anxious.   All other systems reviewed and are negative.  Family History  Problem Relation  Age of Onset   Hypertension Mother    Thyroid  disease Mother    Osteoporosis Mother    Social History   Socioeconomic History   Marital status: Single    Spouse name: Not on file   Number of children: 1   Years of education: Not on file   Highest education level: Not on file  Occupational History   Occupation: disability since 2019  Tobacco Use   Smoking status: Never   Smokeless tobacco: Never  Vaping Use   Vaping status: Never Used  Substance and Sexual Activity   Alcohol use: No   Drug use: No   Sexual activity: Not on file  Other Topics Concern   Not on file  Social History Narrative   Son lives next door   Social Drivers of Health   Financial Resource Strain: Low Risk  (03/28/2024)   Overall Financial Resource Strain (CARDIA)    Difficulty of Paying Living Expenses: Not hard at all  Food Insecurity: No Food Insecurity (03/28/2024)   Hunger Vital Sign    Worried About Running Out of Food in the Last Year: Never true    Ran Out of Food in the Last Year: Never true  Transportation Needs: No Transportation Needs (03/28/2024)   PRAPARE - Administrator, Civil Service (Medical): No    Lack of Transportation (Non-Medical): No  Physical Activity: Sufficiently Active (03/28/2024)   Exercise Vital Sign    Days of Exercise per Week: 7 days    Minutes of Exercise per Session: 30 min  Stress: No Stress Concern Present (03/28/2024)   [employer] of Occupational Health - Occupational Stress Questionnaire    Feeling of Stress : Not at all  Social Connections: Moderately Isolated (03/28/2023)   Social Connection and Isolation Panel    Frequency of Communication with Friends and Family: More than three times a week    Frequency of Social Gatherings with Friends and Family: More than three times a week    Attends Religious Services: More than 4 times per year    Active Member of [employer] or Organizations: No    Attends Banker Meetings: Never    Marital  Status: Divorced       Objective:   Physical Exam Vitals reviewed.  Constitutional:      General: She is not in acute distress.    Appearance: She is well-developed. She is obese.  HENT:     Head: Normocephalic and atraumatic.     Right Ear: Tympanic membrane normal.     Left Ear: Tympanic membrane normal.  Eyes:     Pupils: Pupils are equal, round, and reactive to light.  Neck:     Thyroid : No thyromegaly.  Cardiovascular:     Rate and Rhythm: Normal rate and regular rhythm.     Heart sounds: Murmur heard.  Pulmonary:     Effort: Pulmonary effort is normal. No respiratory distress.     Breath sounds: Normal breath sounds. No wheezing.  Abdominal:     General: Bowel sounds are normal. There is no distension.     Palpations: Abdomen is soft.     Tenderness: There is no abdominal tenderness.  Musculoskeletal:        General: No tenderness. Normal range of motion.     Cervical back: Normal range of  motion and neck supple.     Right lower leg: Edema (trace) present.     Left lower leg: Edema (trace) present.  Skin:    General: Skin is warm and dry.     Findings: No rash.     Comments: Bilateral discoloration of lower leg  Neurological:     Mental Status: She is alert and oriented to person, place, and time.     Cranial Nerves: No cranial nerve deficit.     Deep Tendon Reflexes: Reflexes are normal and symmetric.  Psychiatric:        Behavior: Behavior normal.        Thought Content: Thought content normal.        Judgment: Judgment normal.        BP 126/76   Pulse 80   Temp 99.1 F (37.3 C) (Temporal)   Ht 5' 7 (1.702 m)   Wt 273 lb (123.8 kg)   BMI 42.76 kg/m   Assessment & Plan:   Cynthia Espinoza comes in today with chief complaint of Diabetes  1. Morbid obesity (HCC) - CMP14+EGFR  2. Hyperlipidemia associated with type 2 diabetes mellitus (HCC) - CMP14+EGFR  3. Chronic bilateral low back pain with bilateral sciatica - CMP14+EGFR - meloxicam  (MOBIC )  7.5 MG tablet; Take 1 tablet (7.5 mg total) by mouth daily.  Dispense: 90 tablet; Refill: 3  4. Type 2 diabetes mellitus with other specified complication, with long-term current use of insulin  (HCC) (Primary) - Bayer DCA Hb A1c Waived - insulin  glargine, 2 Unit Dial, (TOUJEO  MAX SOLOSTAR) 300 UNIT/ML Solostar Pen; Inject 65 Units into the skin at bedtime.  Dispense: 9 mL; Refill: 2 - CMP14+EGFR - metFORMIN  (GLUCOPHAGE -XR) 500 MG 24 hr tablet; Take 1 tablet (500 mg total) by mouth daily with breakfast.  Dispense: 90 tablet; Refill: 1  5. Essential hypertension  - benazepril  (LOTENSIN ) 40 MG tablet; Take 1 tablet (40 mg total) by mouth daily.  Dispense: 90 tablet; Refill: 2 - amLODipine  (NORVASC ) 10 MG tablet; Take 1 tablet (10 mg total) by mouth daily.  Dispense: 90 tablet; Refill: 2 - hydrochlorothiazide  (HYDRODIURIL ) 25 MG tablet; Take 1 tablet (25 mg total) by mouth daily.  Dispense: 90 tablet; Refill: 2 - CMP14+EGFR  6. Generalized OA - CMP14+EGFR - meloxicam  (MOBIC ) 7.5 MG tablet; Take 1 tablet (7.5 mg total) by mouth daily.  Dispense: 90 tablet; Refill: 3  7. Hypothyroidism, unspecified type - CMP14+EGFR  8. Myalgia due to statin - CMP14+EGFR  9. Noncompliance  - CMP14+EGFR  10. Recurrent major depressive disorder, in partial remission - CMP14+EGFR    Labs pending Pt very noncompliant, glucose >200. Will place referral to Clinical Pharm to help manage. If we can not control A1C will need to place referral to Endocrinologists.   Increase Toujeo  to to units from 68 units Will add metformin  500 mg  Strict low carb diet  Health Maintenance reviewed Diet and exercise encouraged  Follow up plan: 2  months   Bari Learn, FNP

## 2024-10-04 NOTE — Telephone Encounter (Signed)
 Patient education provided Discussed and reviewed Mounjaro with patient Patient remains on Toujeo  64 units daily monotherapy She has been hesitant with additional therapies Couldn't tolerate metformin  due to extreme GI in the past She has lost 11 lbs since last PCP visit Strongly encouraged addition of Mounjaro PLUS Toujeo  PCP to check A1c today She is more mindful of her diet and portions Will continue to follow  Mliss Tarry Griffin, PharmD, BCACP, CPP Clinical Pharmacist, [facility]

## 2024-10-05 LAB — CMP14+EGFR
ALT: 57 IU/L — ABNORMAL HIGH (ref 0–32)
AST: 62 IU/L — ABNORMAL HIGH (ref 0–40)
Albumin: 4.3 g/dL (ref 3.9–4.9)
Alkaline Phosphatase: 62 IU/L (ref 49–135)
BUN/Creatinine Ratio: 21 (ref 12–28)
BUN: 14 mg/dL (ref 8–27)
Bilirubin Total: 0.5 mg/dL (ref 0.0–1.2)
CO2: 22 mmol/L (ref 20–29)
Calcium: 9.7 mg/dL (ref 8.7–10.3)
Chloride: 99 mmol/L (ref 96–106)
Creatinine, Ser: 0.68 mg/dL (ref 0.57–1.00)
Globulin, Total: 3.1 g/dL (ref 1.5–4.5)
Glucose: 192 mg/dL — ABNORMAL HIGH (ref 70–99)
Potassium: 4.2 mmol/L (ref 3.5–5.2)
Sodium: 137 mmol/L (ref 134–144)
Total Protein: 7.4 g/dL (ref 6.0–8.5)
eGFR: 96 mL/min/1.73 (ref >59)

## 2024-10-07 ENCOUNTER — Ambulatory Visit: Payer: Self-pay | Admitting: Family

## 2024-10-07 DIAGNOSIS — E1165 Type 2 diabetes mellitus with hyperglycemia: Secondary | ICD-10-CM

## 2024-11-21 ENCOUNTER — Other Ambulatory Visit

## 2024-12-02 NOTE — Progress Notes (Unsigned)
 "  12/03/2024 Name: Cynthia Espinoza MRN: [id number] DOB: 05-21-1956  Chief Complaint  Patient presents with   Diabetes    Cynthia Espinoza is a 69 y.o. year old female who presented for a telephone visit.   They were referred to the pharmacist by their PCP for assistance in managing diabetes and medication access.   Subjective:  Today, patient reports for their telephonic appointment for medication management. Patient reports to be doing well. She is still considering the addition of Mounjaro. Reports doing better with her diet and exercise.   Care Team: Primary Care Provider: Lavell Bari LABOR, FNP ; Next Scheduled Visit: 12/06/24  Medication Access/Adherence Current Pharmacy:  [facility] [place] [place] - [address] [place] [address] Phone: [phone] Fax: [phone]  Patient reports affordability concerns with their medications: No  Patient reports access/transportation concerns to their pharmacy: No  Patient reports adherence concerns with their medications:  No    Diabetes: Current medications: Toujeo  64 units daily, metformin  Medications tried in the past: Ozempic  0.25 mg, dapagliflozin  5/10 mg (had heart palpitations), canagliflozin , glipizide , NPH insulin , metformin  (GI upset)   Statin:  Currently not on statin therapy due to myalgias LDL of 93 on 05/28/24; due for lipid panel ACEi/ARB: benazepril  40 mg daily UACR of 5 on 01/12/24; due for UACR  A1c of 10.0% on 10/04/24, up from 9.9% on 07/30/24  Current glucose readings:  Today: 200; range of 210-215 Using Accu-Check meter; testing once daily  Patient denies hypoglycemic s/sx including dizziness, shakiness, sweating. Confirms hypoglycemia plan. Patient denies hyperglycemic symptoms including polyuria, polydipsia, polyphagia, nocturia, neuropathy, blurred vision.  Current meal patterns:  -love chicken and steak -potatoes, pastas are biggest down -veggies and fruits -drinks  Premier Protein shakes (half for lunch, and half for another day)  Current medication access support: Medicare and Medicaid  Objective:  Lab Results  Component Value Date   HGBA1C 10.0 (H) 10/04/2024    Lab Results  Component Value Date   CREATININE 0.68 10/04/2024   BUN 14 10/04/2024   NA 137 10/04/2024   K 4.2 10/04/2024   CL 99 10/04/2024   CO2 22 10/04/2024    Lab Results  Component Value Date   CHOL 195 05/28/2024   HDL 40 05/28/2024   LDLCALC 93 05/28/2024   TRIG 377 (H) 05/28/2024   CHOLHDL 4.9 (H) 05/28/2024    Medications Reviewed Today     Reviewed by Pruitt, Julie D, RPH-CPP (Pharmacist) on 12/04/24 at 2059  Med List Status: <None>   Medication Order Taking? Sig Documenting Provider Last Dose Status Informant  Accu-Chek Softclix Lancets lancets [id number] Yes Check BS up to QID Dx E11.9 Lavell Bari A, FNP  Active   amLODipine  (NORVASC ) 10 MG tablet [id number] Yes Take 1 tablet (10 mg total) by mouth daily. Lavell Bari A, FNP  Active   benazepril  (LOTENSIN ) 40 MG tablet [id number] Yes Take 1 tablet (40 mg total) by mouth daily. Lavell Bari LABOR, FNP  Active   blood glucose meter kit and supplies [id number] Yes Dispense based on patient and insurance preference. Use up to four times daily as directed. (FOR ICD-10 E10.9, E11.9). Lavell Bari LABOR, FNP  Active   Blood Glucose Monitoring Suppl DEVI [id number] Yes 1 each by Does not apply route in the morning, at noon, and at bedtime. May substitute to any manufacturer covered by patient's insurance. Lavell Bari A, FNP  Active   busPIRone  (BUSPAR ) 5 MG tablet [id number]  Take 1 tablet (5 mg total) by mouth 3 (three) times daily as needed. Lavell Bari LABOR, FNP  Active            Med Note DELSA LORAIN SQUIBB   Tue Jun 27, 2023  2:27 PM) Taking PRN  DULoxetine  (CYMBALTA ) 30 MG capsule [id number]  Take 1 capsule (30 mg total) by mouth daily. Lavell Bari A, FNP  Active   glucose blood (ACCU-CHEK GUIDE TEST) test strip  [id number]  Check BS up to QID Dx E11.9 Lavell Bari A, FNP  Active   hydrochlorothiazide  (HYDRODIURIL ) 25 MG tablet [id number] Yes Take 1 tablet (25 mg total) by mouth daily. Hawks, Bari A, FNP  Active   insulin  glargine, 2 Unit Dial, (TOUJEO  MAX SOLOSTAR) 300 UNIT/ML Solostar Pen [id number] Yes Inject 68 Units into the skin at bedtime. Lavell Bari LABOR, FNP  Active   Insulin  Pen Needle (GNP ULTICARE PEN NEEDLES) 32G X 6 MM MISC [id number]  Check BS up to QID Dx E11.9 Lavell Bari LABOR, FNP  Active   Insulin  Pen Needle 29G X MISC [id number]  Use with Insulin  twice daily Dx E11.69 Lavell Bari LABOR, FNP  Active    Patient not taking:   Discontinued 12/03/24 1345 (Patient Preference)   levothyroxine  (SYNTHROID ) 200 MCG tablet [id number] Yes TAKE 1 TABLET BY MOUTH EVERY MORNING BEFORE BREAKFAST Lavell Bari A, FNP  Active   levothyroxine  (SYNTHROID ) 25 MCG tablet [id number] Yes TAKE 1 TABLET BY MOUTH EVERY MORNING BEFORE BREAKFAST Hawks, Christy A, FNP  Active   meloxicam  (MOBIC ) 7.5 MG tablet [id number] Yes Take 1 tablet (7.5 mg total) by mouth daily. Lavell Bari A, FNP  Active   metFORMIN  (GLUCOPHAGE -XR) 500 MG 24 hr tablet [id number]  Take 1 tablet (500 mg total) by mouth daily with breakfast.  Patient not taking: Reported on 10/04/2024   Lavell Bari A, FNP  Active   triamcinolone  ointment (KENALOG ) 0.5 % [id number]  Apply 1 Application topically 2 (two) times daily. Lavell Bari LABOR, FNP  Active             Assessment/Plan:  Diabetes: Currently uncontrolled. Goal of < 7% Cardiorenal risk reduction is is optimized. Blood pressure is at goal of <130/80 mmHg LDL is not at goal of 70 mg/dL Reviewed long term cardiovascular and renal outcomes of uncontrolled blood sugar with FBGs in 200s Reviewed goal A1c, goal fasting, and goal 2 hour post prandial glucose Recommend to:  Restart metformin  ER 500 mg at BEDTIME Continue Toujeo  68 units daily Recommend to check glucose 3 times  Future  Considerations:  Consider GLP-1 therapy. Patient would benefit from DM and ASCVD PREVENT CVD 10-yr score: 19.3% PREVENT ASCVD 10-yr score: 11.5% Discussed side effects of gastrointestinal upset/nausea; eating smaller meals, avoiding high-fat foods, and remaining upright after eating may reduce nausea. Discussed that overeating is a major trigger of nausea with this class of medications, as often times patients will start to feel full sooner and may need to decrease portion sizes from what they were previously accustomed to.  Consider adding mealtime insulin  if patient does not want to add GLP-1 therapy. Would need to monitor and counsel on meals and provide education Consider adding Repatha for lipid management. Previous intolerances to statin therapy due to myalgias. Recurrent LDL levels of 100s and indication for DM.  Follow Up Plan: PharmD 12/24/24  Jenkins Graces, PharmD PGY1 Pharmacy Resident   Mliss Tarry Griffin, PharmD, BCACP, CPP Clinical Pharmacist, [facility]   "

## 2024-12-03 ENCOUNTER — Other Ambulatory Visit

## 2024-12-03 DIAGNOSIS — Z794 Long term (current) use of insulin: Secondary | ICD-10-CM

## 2024-12-03 DIAGNOSIS — E119 Type 2 diabetes mellitus without complications: Secondary | ICD-10-CM

## 2024-12-03 DIAGNOSIS — E1169 Type 2 diabetes mellitus with other specified complication: Secondary | ICD-10-CM

## 2024-12-06 ENCOUNTER — Ambulatory Visit: Admitting: Family

## 2024-12-06 ENCOUNTER — Ambulatory Visit

## 2024-12-06 ENCOUNTER — Encounter: Payer: Self-pay | Admitting: Family

## 2024-12-06 VITALS — BP 118/77 | HR 78 | Temp 98.0°F | Ht 67.0 in | Wt 278.0 lb

## 2024-12-06 DIAGNOSIS — M25561 Pain in right knee: Secondary | ICD-10-CM

## 2024-12-06 DIAGNOSIS — G8929 Other chronic pain: Secondary | ICD-10-CM | POA: Diagnosis not present

## 2024-12-06 DIAGNOSIS — E785 Hyperlipidemia, unspecified: Secondary | ICD-10-CM

## 2024-12-06 DIAGNOSIS — E039 Hypothyroidism, unspecified: Secondary | ICD-10-CM

## 2024-12-06 DIAGNOSIS — M5442 Lumbago with sciatica, left side: Secondary | ICD-10-CM | POA: Diagnosis not present

## 2024-12-06 DIAGNOSIS — M791 Myalgia, unspecified site: Secondary | ICD-10-CM | POA: Diagnosis not present

## 2024-12-06 DIAGNOSIS — F3341 Major depressive disorder, recurrent, in partial remission: Secondary | ICD-10-CM | POA: Diagnosis not present

## 2024-12-06 DIAGNOSIS — I10 Essential (primary) hypertension: Secondary | ICD-10-CM | POA: Diagnosis not present

## 2024-12-06 DIAGNOSIS — Z794 Long term (current) use of insulin: Secondary | ICD-10-CM

## 2024-12-06 DIAGNOSIS — Z91199 Patient's noncompliance with other medical treatment and regimen due to unspecified reason: Secondary | ICD-10-CM | POA: Diagnosis not present

## 2024-12-06 DIAGNOSIS — M159 Polyosteoarthritis, unspecified: Secondary | ICD-10-CM | POA: Diagnosis not present

## 2024-12-06 DIAGNOSIS — E1169 Type 2 diabetes mellitus with other specified complication: Secondary | ICD-10-CM

## 2024-12-06 LAB — BAYER DCA HB A1C WAIVED: HB A1C (BAYER DCA - WAIVED): 10.7 % — ABNORMAL HIGH (ref 4.8–5.6)

## 2024-12-06 MED ORDER — TOUJEO MAX SOLOSTAR 300 UNIT/ML ~~LOC~~ SOPN: 72.0000 [IU] | PEN_INJECTOR | Freq: Every day | SUBCUTANEOUS | 2 refills | Status: AC

## 2024-12-06 NOTE — Patient Instructions (Signed)

## 2024-12-06 NOTE — Progress Notes (Signed)
 "  Subjective:    Patient ID: Cynthia Espinoza, female    DOB: [birth year], 69 y.o.   MRN: [id number]  Chief Complaint  Patient presents with   Diabetes   Knee Pain    Right knee pain wants xray    Pt presents to the office today for chronic follow up and uncontrolled DM. She is noncompliant.   Pt's A1C was 10 on 10/04/24. We added Ozempic  0.25 mg in the past and farxiga ,  but stopped this because she had abdominal pain. Taking Toujeo  68 units.  She is followed by Clinical Pharmacists.   She is morbid obese with a BMI of 43.   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      12/06/2024    3:30 PM 10/04/2024    3:16 PM 07/30/2024    3:00 PM  Last 3 Weights  Weight (lbs) 278 lb 273 lb 284 lb  Weight (kg) 126.1 kg 123.832 kg 128.822 kg     Hypertension This is a chronic problem. The current episode started more than 1 year ago. The problem has been resolved since onset. The problem is controlled. Pertinent negatives include no blurred vision, malaise/fatigue, peripheral edema or shortness of breath. Risk factors for coronary artery disease include dyslipidemia, diabetes mellitus, obesity, sedentary lifestyle and post-menopausal state. The current treatment provides moderate improvement. Identifiable causes of hypertension include a thyroid  problem.  Thyroid  Problem Presents for follow-up visit. Symptoms include diarrhea, dry skin and hair loss. Patient reports no anxiety, cold intolerance, constipation or fatigue. The symptoms have been stable.  Hyperlipidemia This is a chronic problem. The current episode started more than 1 year ago. The problem is uncontrolled. Recent lipid tests were reviewed and are normal. Exacerbating diseases include hypothyroidism and obesity. Associated symptoms include leg pain. Pertinent negatives include no shortness of breath. Current antihyperlipidemic treatment includes diet change. The current treatment provides no improvement of lipids. Risk factors for  coronary artery disease include diabetes mellitus, dyslipidemia, hypertension, a sedentary lifestyle, post-menopausal and obesity.  Diabetes She presents for her follow-up diabetic visit. She has type 2 diabetes mellitus. Pertinent negatives for hypoglycemia include no nervousness/anxiousness. Pertinent negatives for diabetes include no blurred vision, no fatigue and no foot paresthesias. Pertinent negatives for diabetic complications include no peripheral neuropathy. Risk factors for coronary artery disease include dyslipidemia, diabetes mellitus, hypertension, sedentary lifestyle, post-menopausal and obesity. She is following a generally unhealthy diet. Her overall blood glucose range is >200 mg/dl.  Back Pain This is a chronic problem. The current episode started more than 1 year ago. The problem occurs intermittently. The problem has been waxing and waning since onset. The pain is present in the lumbar spine. The quality of the pain is described as aching. The pain is at a severity of 8/10. The pain is moderate. Associated symptoms include leg pain. Risk factors include obesity. She has tried bed rest for the symptoms. The treatment provided moderate relief.  Arthritis Presents for follow-up visit. She complains of pain and stiffness. She reports no joint warmth. The symptoms have been stable. Affected locations include the left knee, right knee, left MCP, right MCP, left elbow, right elbow, left hip and right hip. Her pain is at a severity of 10/10. Associated symptoms include diarrhea. Pertinent negatives include no fatigue.  Knee Pain  The incident occurred more than 1 week ago. The pain is present in the right knee. The quality of the pain is described as aching. The pain is at a severity of  10/10. She reports no foreign bodies present. She has tried acetaminophen, rest and NSAIDs for the symptoms. The treatment provided mild relief.      Review of Systems  Constitutional:  Negative for fatigue  and malaise/fatigue.  Eyes:  Negative for blurred vision.  Respiratory:  Negative for shortness of breath.   Gastrointestinal:  Positive for diarrhea. Negative for constipation.  Endocrine: Negative for cold intolerance.  Musculoskeletal:  Positive for back pain and stiffness.  Psychiatric/Behavioral:  The patient is not nervous/anxious.   All other systems reviewed and are negative.  Family History  Problem Relation Age of Onset   Hypertension Mother    Thyroid  disease Mother    Osteoporosis Mother    Social History   Socioeconomic History   Marital status: Single    Spouse name: Not on file   Number of children: 1   Years of education: Not on file   Highest education level: Not on file  Occupational History   Occupation: disability since 2019  Tobacco Use   Smoking status: Never   Smokeless tobacco: Never  Vaping Use   Vaping status: Never Used  Substance and Sexual Activity   Alcohol use: No   Drug use: No   Sexual activity: Not on file  Other Topics Concern   Not on file  Social History Narrative   Son lives next door   Social Drivers of Health   Tobacco Use: Low Risk (12/06/2024)   Patient History    Smoking Tobacco Use: Never    Smokeless Tobacco Use: Never    Passive Exposure: Not on file  Financial Resource Strain: Low Risk (03/28/2024)   Overall Financial Resource Strain (CARDIA)    Difficulty of Paying Living Expenses: Not hard at all  Food Insecurity: No Food Insecurity (03/28/2024)   Hunger Vital Sign    Worried About Running Out of Food in the Last Year: Never true    Ran Out of Food in the Last Year: Never true  Transportation Needs: No Transportation Needs (03/28/2024)   PRAPARE - Administrator, Civil Service (Medical): No    Lack of Transportation (Non-Medical): No  Physical Activity: Sufficiently Active (03/28/2024)   Exercise Vital Sign    Days of Exercise per Week: 7 days    Minutes of Exercise per Session: 30 min  Stress: No  Stress Concern Present (03/28/2024)   [employer] of Occupational Health - Occupational Stress Questionnaire    Feeling of Stress : Not at all  Social Connections: Moderately Isolated (03/28/2023)   Social Connection and Isolation Panel    Frequency of Communication with Friends and Family: More than three times a week    Frequency of Social Gatherings with Friends and Family: More than three times a week    Attends Religious Services: More than 4 times per year    Active Member of Clubs or Organizations: No    Attends Banker Meetings: Never    Marital Status: Divorced  Depression (PHQ2-9): Low Risk (12/06/2024)   Depression (PHQ2-9)    PHQ-2 Score: 0  Alcohol Screen: Low Risk (03/28/2024)   Alcohol Screen    Last Alcohol Screening Score (AUDIT): 0  Housing: Unknown (03/28/2024)   Housing Stability Vital Sign    Unable to Pay for Housing in the Last Year: No    Number of Times Moved in the Last Year: Not on file    Homeless in the Last Year: No  Utilities: Not At Risk (03/28/2024)  AHC Utilities    Threatened with loss of utilities: No  Health Literacy: Adequate Health Literacy (03/28/2024)   B1300 Health Literacy    Frequency of need for help with medical instructions: Never       Objective:   Physical Exam Vitals reviewed.  Constitutional:      General: She is not in acute distress.    Appearance: She is well-developed. She is obese.  HENT:     Head: Normocephalic and atraumatic.     Right Ear: Tympanic membrane normal.     Left Ear: Tympanic membrane normal.  Eyes:     Pupils: Pupils are equal, round, and reactive to light.  Neck:     Thyroid : No thyromegaly.  Cardiovascular:     Rate and Rhythm: Normal rate and regular rhythm.     Heart sounds: Murmur heard.  Pulmonary:     Effort: Pulmonary effort is normal. No respiratory distress.     Breath sounds: Normal breath sounds. No wheezing.  Abdominal:     General: Bowel sounds are normal. There is no  distension.     Palpations: Abdomen is soft.     Tenderness: There is no abdominal tenderness.  Musculoskeletal:        General: No tenderness. Normal range of motion.     Cervical back: Normal range of motion and neck supple.     Right lower leg: Edema (trace) present.     Left lower leg: Edema (trace) present.  Skin:    General: Skin is warm and dry.     Findings: No rash.     Comments: Bilateral discoloration of lower leg  Neurological:     Mental Status: She is alert and oriented to person, place, and time.     Cranial Nerves: No cranial nerve deficit.     Deep Tendon Reflexes: Reflexes are normal and symmetric.  Psychiatric:        Behavior: Behavior normal.        Thought Content: Thought content normal.        Judgment: Judgment normal.        BP 118/77   Pulse 78   Temp 98 F (36.7 C) (Temporal)   Ht 5' 7 (1.702 m)   Wt 278 lb (126.1 kg)   SpO2 96%   BMI 43.54 kg/m   Assessment & Plan:   JAHNI PAUL comes in today with chief complaint of Diabetes and Knee Pain (Right knee pain wants xray )  1. Type 2 diabetes mellitus with other specified complication, with long-term current use of insulin  (HCC) (Primary) Will increase Toujeo  72 unit, increase 3 units every 3 days Strict low carb Pt very noncompliant. Does not want to add medications. Does not want to see Endocrinologists.   - Bayer DCA Hb A1c Waived - CMP14+EGFR - insulin  glargine, 2 Unit Dial, (TOUJEO  MAX SOLOSTAR) 300 UNIT/ML Solostar Pen; Inject 72 Units into the skin at bedtime.  Dispense: 9 mL; Refill: 2  2. Essential hypertension - CMP14+EGFR  3. Hypothyroidism, unspecified type - CMP14+EGFR  4. Hyperlipidemia associated with type 2 diabetes mellitus (HCC) - CMP14+EGFR  5. Chronic bilateral low back pain with bilateral sciatica  - CMP14+EGFR  6. Generalized OA - CMP14+EGFR  7. Recurrent major depressive disorder, in partial remission  - CMP14+EGFR  8. Noncompliance  -  CMP14+EGFR  9. Myalgia due to statin  - CMP14+EGFR  10. Morbid obesity (HCC)  - CMP14+EGFR  11. Chronic pain of right knee - CMP14+EGFR -  DG Knee 1-2 Views Right; Future   Labs pending Pt very noncompliant.  Keep appointment with Clinical Pharm to help manage. Pt refuses Endocrinologists referral  Increase Toujeo  to 72 units to units from 68 units Continue metformin  500 mg  Strict low carb diet  Health Maintenance reviewed-Refuses vaccines Diet and exercise encouraged  Follow up plan: 2  months   Bari Learn, FNP  "

## 2024-12-07 LAB — CMP14+EGFR
ALT: 64 [IU]/L — ABNORMAL HIGH (ref 0–32)
AST: 84 [IU]/L — ABNORMAL HIGH (ref 0–40)
Albumin: 4.2 g/dL (ref 3.9–4.9)
Alkaline Phosphatase: 59 [IU]/L (ref 49–135)
BUN/Creatinine Ratio: 19 (ref 12–28)
BUN: 14 mg/dL (ref 8–27)
Bilirubin Total: 0.4 mg/dL (ref 0.0–1.2)
CO2: 21 mmol/L (ref 20–29)
Calcium: 9.6 mg/dL (ref 8.7–10.3)
Chloride: 99 mmol/L (ref 96–106)
Creatinine, Ser: 0.74 mg/dL (ref 0.57–1.00)
Globulin, Total: 3.2 g/dL (ref 1.5–4.5)
Glucose: 207 mg/dL — ABNORMAL HIGH (ref 70–99)
Potassium: 4.2 mmol/L (ref 3.5–5.2)
Sodium: 138 mmol/L (ref 134–144)
Total Protein: 7.4 g/dL (ref 6.0–8.5)
eGFR: 89 mL/min/{1.73_m2}

## 2024-12-10 ENCOUNTER — Encounter: Payer: Self-pay | Admitting: Pharmacist

## 2024-12-12 ENCOUNTER — Telehealth: Payer: Self-pay

## 2024-12-12 ENCOUNTER — Ambulatory Visit: Payer: Self-pay | Admitting: Family

## 2024-12-12 DIAGNOSIS — R748 Abnormal levels of other serum enzymes: Secondary | ICD-10-CM

## 2024-12-12 NOTE — Telephone Encounter (Signed)
 Reviewed and documented in a separate encounter. LS

## 2024-12-12 NOTE — Telephone Encounter (Signed)
 Copied from CRM #[id number]. Topic: Clinical - Lab/Test Results >> Dec 12, 2024 12:22 PM Montie POUR wrote: Reason for CRM:  I read her lab results dated 12/12/24. She had no questions or concerns. She will have the ultrasound as scheduled. She wants a nurse to call her back with her knee x-ray results. Please call her at [phone]. She cannot get into MyChart.

## 2024-12-13 NOTE — Progress Notes (Signed)
 Cynthia Espinoza                                          MRN: [id number]   12/13/2024   The [facility] Quality Team Specialist reviewed this patient medical record for the purposes of chart review for care gap closure. The following were reviewed: chart review for care gap closure-glycemic status assessment.    [facility] Quality Team

## 2024-12-18 ENCOUNTER — Telehealth: Payer: Self-pay | Admitting: Family

## 2024-12-18 NOTE — Telephone Encounter (Unsigned)
 Copied from CRM [id number]. Topic: Appointments - Appointment Cancel/Reschedule >> Dec 18, 2024  2:09 PM Avram MATSU wrote: Patient would like her appt reschedule with the pharmacist on 2/10 to 2/27 at 2pm if possible. Please advise [phone]

## 2024-12-23 ENCOUNTER — Ambulatory Visit (HOSPITAL_COMMUNITY)

## 2024-12-24 ENCOUNTER — Other Ambulatory Visit

## 2025-02-11 ENCOUNTER — Ambulatory Visit: Admitting: Family
